# Patient Record
Sex: Female | Born: 1965
Health system: Southern US, Academic
[De-identification: ages and names within clinical notes are randomized; demographics above are authoritative.]

## PROBLEM LIST (undated history)

## (undated) ENCOUNTER — Encounter: Attending: Pharmacist | Primary: Pharmacist

## (undated) ENCOUNTER — Other Ambulatory Visit

## (undated) ENCOUNTER — Ambulatory Visit: Payer: MEDICARE

## (undated) ENCOUNTER — Encounter

## (undated) ENCOUNTER — Telehealth: Attending: Ambulatory Care | Primary: Ambulatory Care

## (undated) ENCOUNTER — Encounter: Attending: Family | Primary: Family

## (undated) ENCOUNTER — Ambulatory Visit

## (undated) ENCOUNTER — Encounter: Attending: Rheumatology | Primary: Rheumatology

## (undated) ENCOUNTER — Telehealth: Attending: Family | Primary: Family

## (undated) ENCOUNTER — Ambulatory Visit: Attending: Pharmacist | Primary: Pharmacist

## (undated) ENCOUNTER — Telehealth

## (undated) ENCOUNTER — Ambulatory Visit: Payer: MEDICARE | Attending: Family | Primary: Family

## (undated) ENCOUNTER — Encounter: Attending: Diagnostic Radiology | Primary: Diagnostic Radiology

## (undated) ENCOUNTER — Encounter: Attending: Ambulatory Care | Primary: Ambulatory Care

## (undated) ENCOUNTER — Encounter: Attending: Internal Medicine | Primary: Internal Medicine

## (undated) ENCOUNTER — Ambulatory Visit: Payer: MEDICARE | Attending: Medical | Primary: Medical

## (undated) ENCOUNTER — Ambulatory Visit
Payer: MEDICARE | Attending: Rehabilitative and Restorative Service Providers" | Primary: Rehabilitative and Restorative Service Providers"

## (undated) ENCOUNTER — Ambulatory Visit: Attending: Ambulatory Care | Primary: Ambulatory Care

## (undated) ENCOUNTER — Encounter: Attending: Nurse Practitioner | Primary: Nurse Practitioner

## (undated) ENCOUNTER — Ambulatory Visit: Payer: MEDICARE | Attending: Dermatology | Primary: Dermatology

---
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
## 1998-09-25 ENCOUNTER — Inpatient Hospital Stay (HOSPITAL_COMMUNITY): Admission: AD | Admit: 1998-09-25 | Discharge: 1998-09-25 | Payer: Self-pay | Admitting: Obstetrics and Gynecology

## 1998-10-01 ENCOUNTER — Inpatient Hospital Stay (HOSPITAL_COMMUNITY): Admission: AD | Admit: 1998-10-01 | Discharge: 1998-10-01 | Payer: Self-pay | Admitting: *Deleted

## 1998-10-02 ENCOUNTER — Inpatient Hospital Stay (HOSPITAL_COMMUNITY): Admission: AD | Admit: 1998-10-02 | Discharge: 1998-10-04 | Payer: Self-pay | Admitting: Obstetrics and Gynecology

## 1998-11-27 ENCOUNTER — Inpatient Hospital Stay (HOSPITAL_COMMUNITY): Admission: AD | Admit: 1998-11-27 | Discharge: 1998-11-27 | Payer: Self-pay | Admitting: Obstetrics and Gynecology

## 1999-01-09 ENCOUNTER — Inpatient Hospital Stay (HOSPITAL_COMMUNITY): Admission: AD | Admit: 1999-01-09 | Discharge: 1999-01-09 | Payer: Self-pay | Admitting: *Deleted

## 1999-01-10 ENCOUNTER — Inpatient Hospital Stay (HOSPITAL_COMMUNITY): Admission: AD | Admit: 1999-01-10 | Discharge: 1999-01-14 | Payer: Self-pay | Admitting: *Deleted

## 1999-02-23 ENCOUNTER — Other Ambulatory Visit: Admission: RE | Admit: 1999-02-23 | Discharge: 1999-02-23 | Payer: Self-pay | Admitting: *Deleted

## 2000-10-07 ENCOUNTER — Other Ambulatory Visit: Admission: RE | Admit: 2000-10-07 | Discharge: 2000-10-07 | Payer: Self-pay | Admitting: *Deleted

## 2000-11-10 ENCOUNTER — Inpatient Hospital Stay (HOSPITAL_COMMUNITY): Admission: AD | Admit: 2000-11-10 | Discharge: 2000-11-13 | Payer: Self-pay | Admitting: *Deleted

## 2001-02-06 ENCOUNTER — Encounter: Payer: Self-pay | Admitting: Emergency Medicine

## 2001-02-06 ENCOUNTER — Emergency Department (HOSPITAL_COMMUNITY): Admission: EM | Admit: 2001-02-06 | Discharge: 2001-02-06 | Payer: Self-pay | Admitting: Emergency Medicine

## 2001-02-28 ENCOUNTER — Ambulatory Visit (HOSPITAL_COMMUNITY): Admission: RE | Admit: 2001-02-28 | Discharge: 2001-02-28 | Payer: Self-pay | Admitting: Family Medicine

## 2001-02-28 ENCOUNTER — Encounter: Payer: Self-pay | Admitting: Family Medicine

## 2003-05-17 ENCOUNTER — Encounter: Payer: Self-pay | Admitting: Family Medicine

## 2003-05-17 ENCOUNTER — Ambulatory Visit (HOSPITAL_COMMUNITY): Admission: RE | Admit: 2003-05-17 | Discharge: 2003-05-17 | Payer: Self-pay | Admitting: Family Medicine

## 2005-08-13 ENCOUNTER — Ambulatory Visit: Payer: Self-pay | Admitting: Orthopedic Surgery

## 2005-08-25 ENCOUNTER — Ambulatory Visit: Payer: Self-pay | Admitting: Orthopedic Surgery

## 2006-06-23 ENCOUNTER — Emergency Department: Payer: Self-pay | Admitting: Emergency Medicine

## 2006-06-28 ENCOUNTER — Ambulatory Visit: Payer: Self-pay | Admitting: Family Medicine

## 2007-05-24 IMAGING — US US PELV - US TRANSVAGINAL
1 series · 17 of 25 positions shown · non-contrast
Comparison: none

REASON FOR EXAM: pelvic pain  endovaginal  rm 4
COMMENTS:

[Series 1: us pelv - us transvaginal · 17 of 29 slices shown]
[im 1/29]
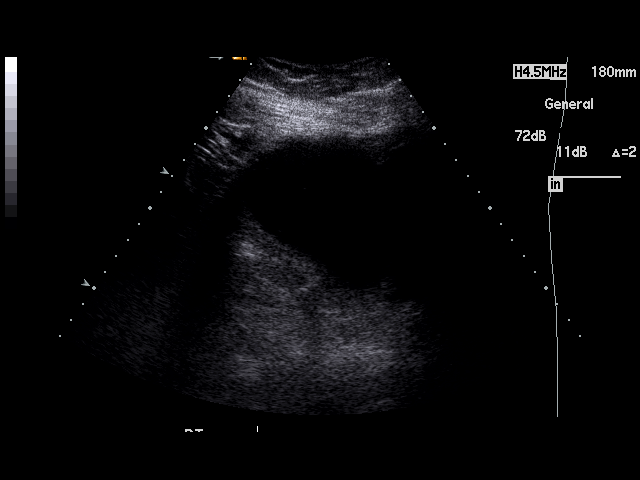
[im 3/29]
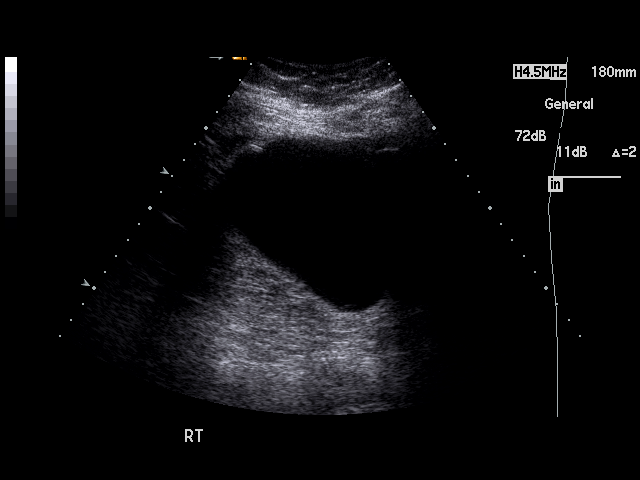
[im 4/29]
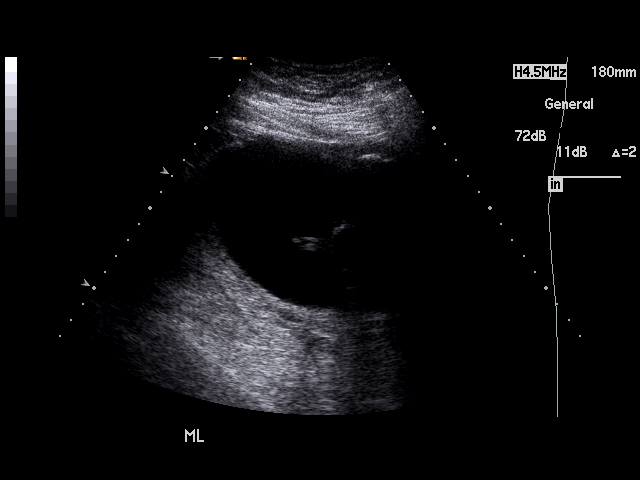
[im 6/29]
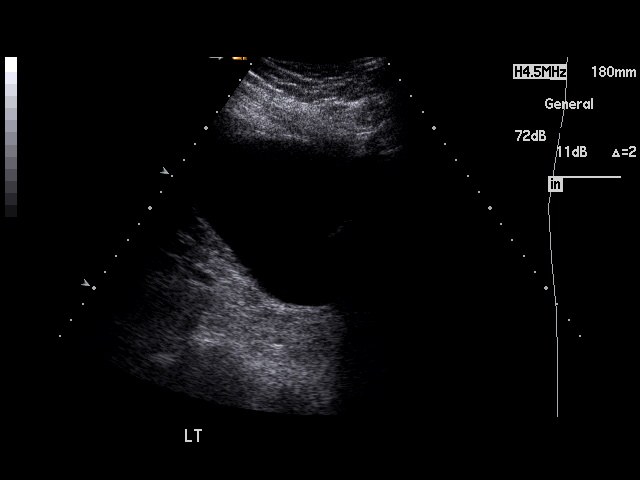
[im 8/29]
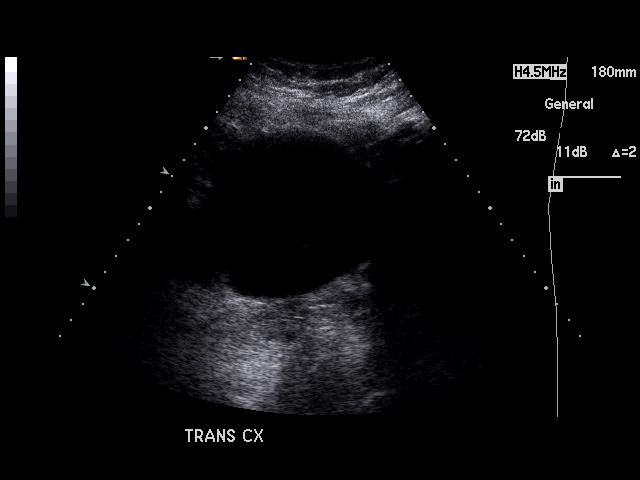
[im 10/29]
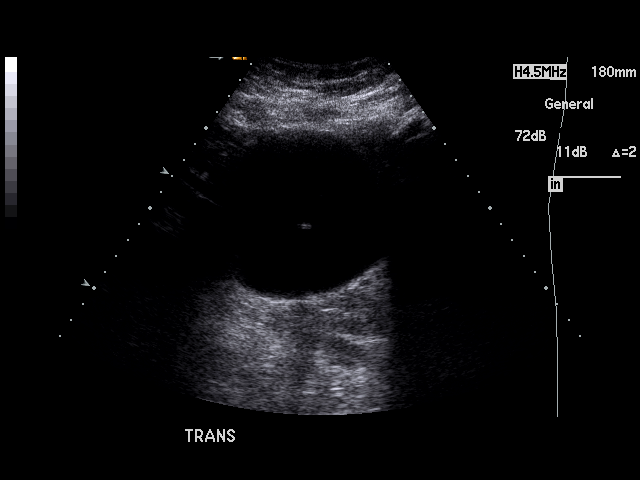
[im 11/29]
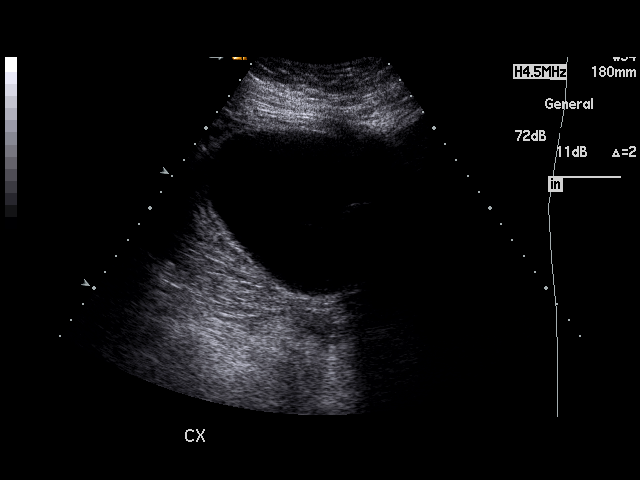
[im 13/29]
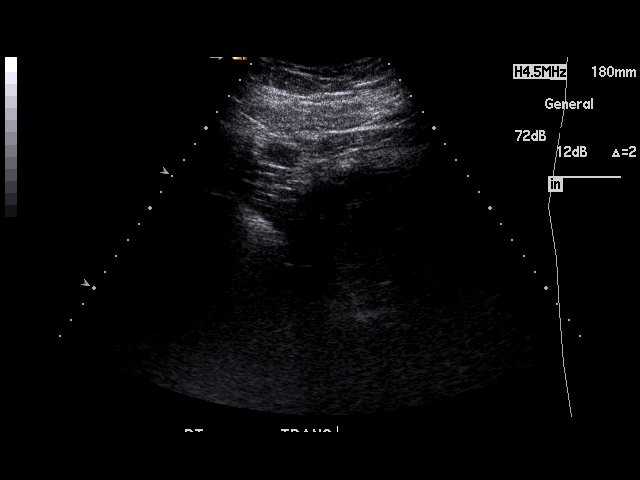
[im 15/29]
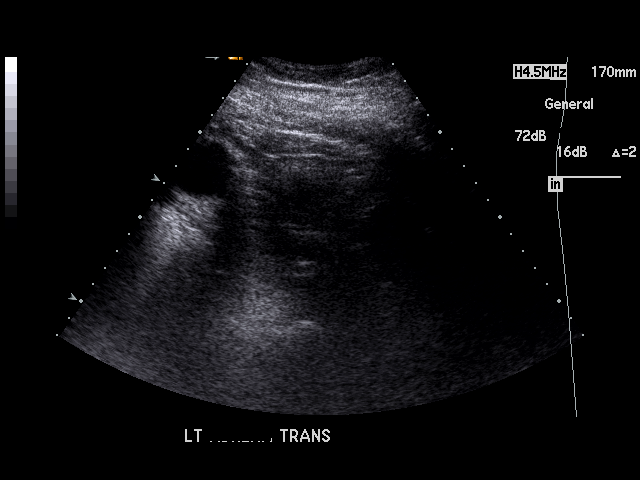
[im 16/29]
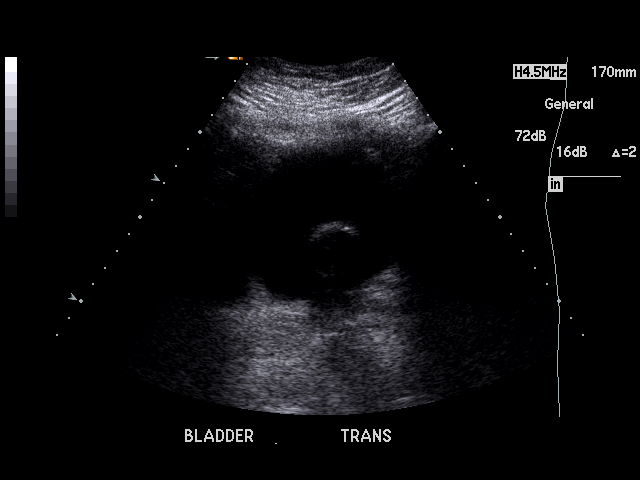
[im 18/29]
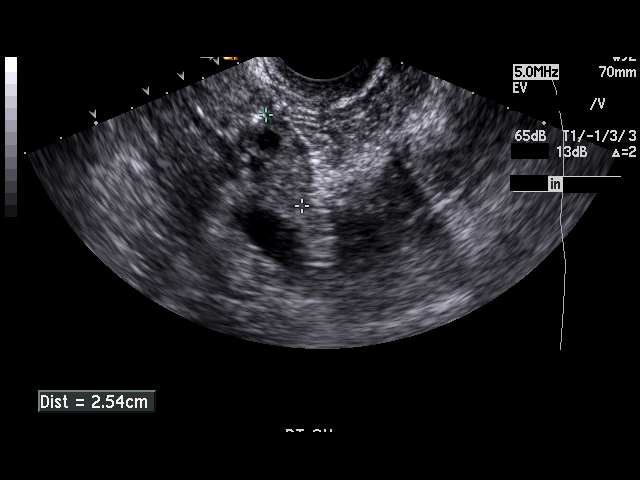
[im 19/29]
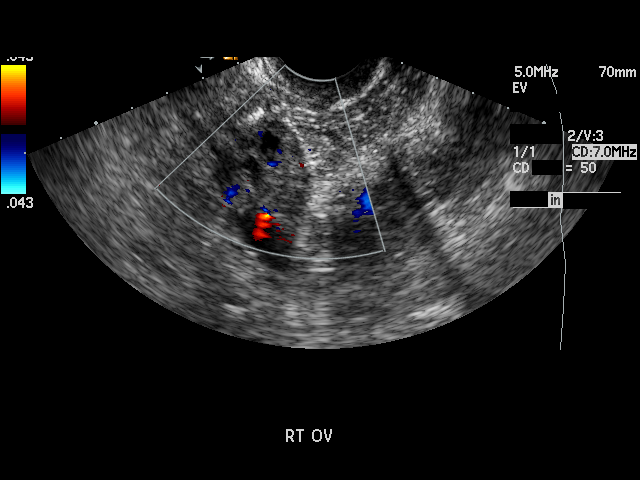
[im 22/29]
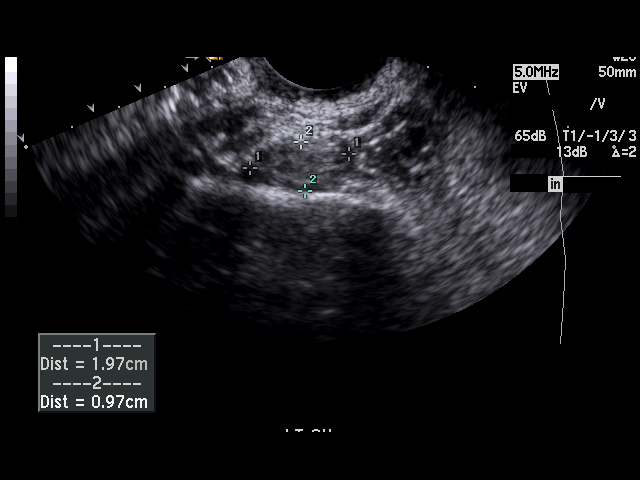
[im 23/29]
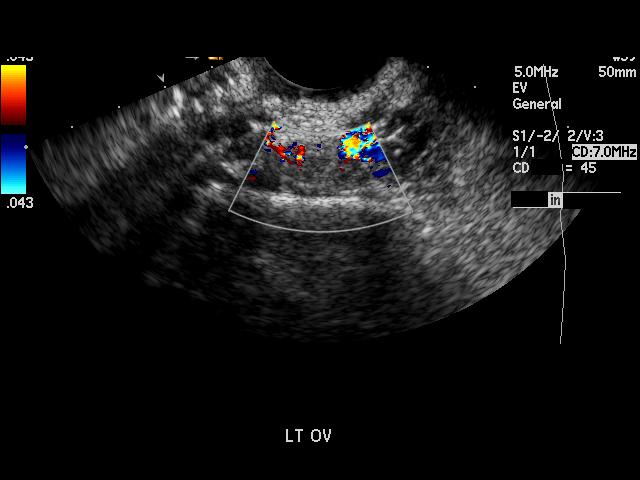
[im 25/29]
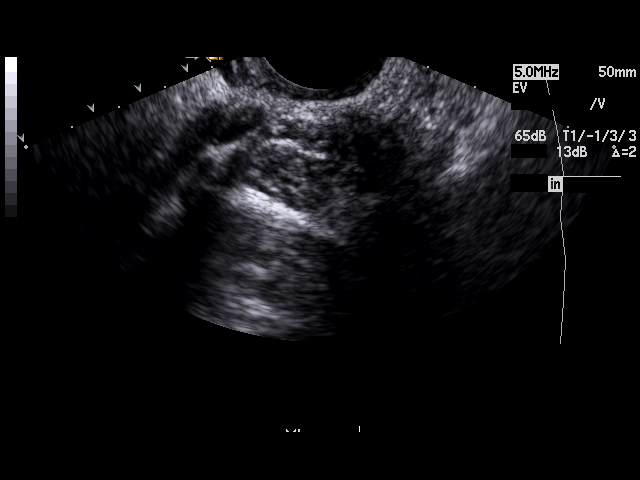
[im 26/29]
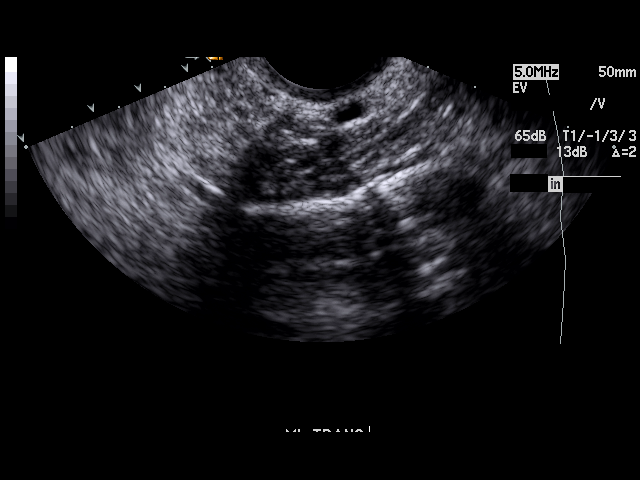
[im 29/29]
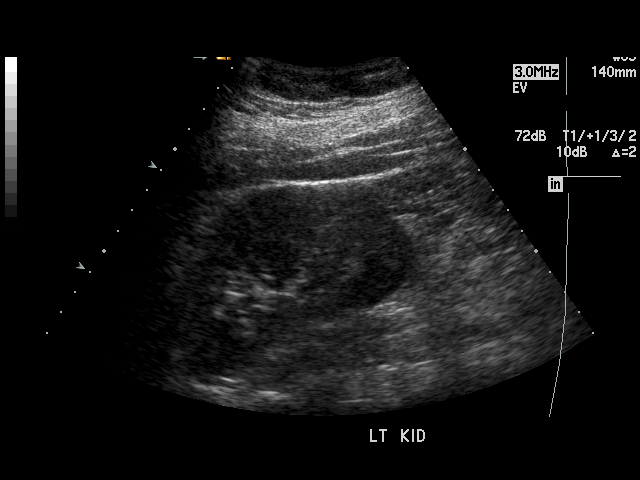

[17 of 25 positions shown; findings below may reference images not displayed]

PROCEDURE:     US  - US PELVIS MASS EXAM  - [DATE]  [DATE] [DATE]  [DATE]

RESULT:     The patient is status post hysterectomy. The ovaries are
visualized bilaterally. The RIGHT ovary measures 2.54 cm at maximum diameter
and the LEFT ovary measures 1.97 cm at maximum diameter.  A few follicular
cysts are seen in the RIGHT ovary.  No abnormal adnexal masses are noted.  A
Foley catheter balloon is present in the urinary bladder. The visualized
portion of the urinary bladder is normal in appearance. The kidneys show no
hydronephrosis. No free fluid is seen in the pelvis.
IMPRESSION: 1)No acute changes are identified.

2)The patient is status post hysterectomy.

3)No abnormal adnexal masses are identified.

## 2008-06-11 ENCOUNTER — Ambulatory Visit: Payer: Self-pay | Admitting: Cardiology

## 2008-06-11 ENCOUNTER — Emergency Department: Payer: Self-pay | Admitting: Emergency Medicine

## 2008-06-11 ENCOUNTER — Other Ambulatory Visit: Payer: Self-pay

## 2014-10-10 ENCOUNTER — Ambulatory Visit: Payer: Self-pay | Admitting: Emergency Medicine

## 2016-08-09 ENCOUNTER — Ambulatory Visit (INDEPENDENT_AMBULATORY_CARE_PROVIDER_SITE_OTHER): Payer: Medicaid Other | Admitting: Podiatry

## 2016-08-09 ENCOUNTER — Encounter: Payer: Self-pay | Admitting: Podiatry

## 2016-08-09 ENCOUNTER — Ambulatory Visit (INDEPENDENT_AMBULATORY_CARE_PROVIDER_SITE_OTHER): Payer: Medicaid Other

## 2016-08-09 VITALS — BP 116/78 | HR 78 | Resp 12

## 2016-08-09 DIAGNOSIS — M7662 Achilles tendinitis, left leg: Secondary | ICD-10-CM

## 2016-08-09 DIAGNOSIS — M797 Fibromyalgia: Secondary | ICD-10-CM | POA: Diagnosis not present

## 2016-08-09 DIAGNOSIS — M722 Plantar fascial fibromatosis: Secondary | ICD-10-CM

## 2016-08-09 MED ORDER — GABAPENTIN 100 MG PO CAPS: 100.0000 mg | ORAL_CAPSULE | Freq: Two times a day (BID) | ORAL | 3 refills | Status: AC

## 2016-08-09 NOTE — Progress Notes (Signed)
   Subjective:    Patient ID: Amy Hebert, female    DOB: [birth year], 50 y.o.   MRN: [id number]  HPI: She presents today with a 2 year duration of pain to her right heel plantarly and posterior heel times past 6 months. She has a history of fibromyalgia as well as psoriatic arthritis with enthesopathies. She is currently taking Enbrel and methotrexate as well as Cymbalta to help with these symptoms. She states that today she feels great but tomorrow she may be painful. She states that her rheumatologist recommended that we treat the pain in her feet including the fibromyalgia in her feet and legs.    Review of Systems  Musculoskeletal: Positive for gait problem.       Objective:   Physical Exam: Vital signs are stable she is alert and oriented 3 pulses are palpable. Neurologic sensorium is intact. Deep tendon reflexes are intact muscle strength is normal bilateral and equal symmetrically. Orthopedic evaluation demonstrates some tenderness on palpation of the medial calcaneal tubercle and central block R calcaneal tubercle of the right heel. The posterior aspect of the left. Radiographs taken today demonstrate plantar and posterior calcaneal heel spurs bilaterally. No fractures identified. Cutaneous evaluation demonstrates supple well-hydrated cutis. Reactive nail growth hallux right probably a nail dystrophy. The remainder of the nail plates. Normal.        Assessment & Plan:  Assessment: Fibromyalgia with plantar fasciitis right tendo Achilles tendinitis left.  Plan: Offered her injections today which she declined offered her physical therapy which her insurance will not cover. I also recommended starting her on gabapentin which we did today 100 mg twice daily and I will follow-up with her in 1 month.

## 2016-09-08 ENCOUNTER — Encounter: Payer: Self-pay | Admitting: Podiatry

## 2016-09-08 ENCOUNTER — Ambulatory Visit (INDEPENDENT_AMBULATORY_CARE_PROVIDER_SITE_OTHER): Payer: Medicaid Other | Admitting: Podiatry

## 2016-09-08 DIAGNOSIS — M722 Plantar fascial fibromatosis: Secondary | ICD-10-CM

## 2016-09-08 DIAGNOSIS — M7662 Achilles tendinitis, left leg: Secondary | ICD-10-CM

## 2016-09-08 NOTE — Progress Notes (Signed)
She presents today for follow-up of plantar fasciitis and Achilles tendinitis in her left heel. She states that she is doing much better and that her orthopedist thinks that she may have a neck issue that is complicating her leg pain and foot pain. She states that the gabapentin that we placed her on seems to be helping considerably.  Objective: Vital signs are stable alert and oriented 3. Pulses are palpable. Neurologic sensorium is intact. She has no reproducible pain on palpation either the left heel plantarly or posteriorly.  Assessment: Resolving Achilles tendinitis and plantar fasciitis.  Plan: I will follow-up with her on an as-needed basis.

## 2016-09-27 ENCOUNTER — Ambulatory Visit: Payer: Medicaid Other | Attending: Orthopedic Surgery | Admitting: Physical Therapy

## 2016-09-27 ENCOUNTER — Encounter: Payer: Self-pay | Admitting: Physical Therapy

## 2016-09-27 DIAGNOSIS — M542 Cervicalgia: Secondary | ICD-10-CM | POA: Diagnosis present

## 2016-09-27 DIAGNOSIS — M6281 Muscle weakness (generalized): Secondary | ICD-10-CM | POA: Diagnosis present

## 2016-09-27 DIAGNOSIS — M25511 Pain in right shoulder: Secondary | ICD-10-CM | POA: Insufficient documentation

## 2016-09-27 DIAGNOSIS — G8929 Other chronic pain: Secondary | ICD-10-CM

## 2016-09-27 NOTE — Therapy (Signed)
[facility] [address] [place], [place], [address] Phone: [phone]   Fax:  [phone]  Physical Therapy Evaluation  Patient Details  Name: Amy Hebert MRN: [id number] Date of Birth: [birth year] Referring Provider: Dante GangJonathan T Mundy PA  Encounter Date: 09/27/2016      PT End of Session - 09/27/16 1530    Visit Number 1   Number of Visits 1   PT Start Time 1118   PT Stop Time 1209   PT Time Calculation (min) 51 min   Activity Tolerance Patient tolerated treatment well;Patient limited by pain   Behavior During Therapy [facility] for tasks assessed/performed      History reviewed. No pertinent past medical history.  History reviewed. No pertinent surgical history.  There were no vitals filed for this visit.       Subjective Assessment - 09/27/16 1519    Subjective Pt has a history of chronic shoulder and neck pain. States that her L shoulder/arm feels weak but she experiences most of the pain in her neck and R shoulder. She was told she had a herniated disc in her cervical spine and is concerned about moving her head from side to side for fear that she will injury herself. She has been diagnosed with psoriatic arthritis and fibromyalgia and is limited in the amount of exercise that her body will tolerate. Pt is not working at this time due to poor health and is struggling to continue to perform ADLs due to pain. Pt states that she experiences jolts of pain up her arm and neck but the pain is usually short lived, "there and then gone" but she does note a more constant pain evident in her upper back and shoulders. States that if she does not move for an extended period of time she will be pain free but once she starts moving, pain is aggravated.   Pertinent History Chronic hx of neck and shoulder pain. Co-morbidites include fibromyalgia and psoriatic arthritis    Limitations Lifting;House hold activities   Diagnostic tests Xrays  of the cervical spine were ordered and interpreted 09/07/2016, with 2 views using AP and lateral views.  Xrays revealed multilevel degenerative disc changes with the C6-C7 level showing more predominant narrowing with disc space collapse. There is no subluxation or abnormal curvature.   Patient Stated Goals pt would like to be more active/pain free. states she would like to be able to play basketball as a hobby   Currently in Pain? Yes   Pain Score 4    Pain Location Neck   Pain Orientation Lower;Mid;Right;Left   Pain Descriptors / Indicators Constant   Pain Type Chronic pain   Pain Onset More than a month ago   Pain Frequency Constant            [facility] PT Assessment - 09/27/16 0001      Assessment   Medical Diagnosis cervical radiculopathy   Referring Provider Dante GangJonathan T Mundy PA   Onset Date/Surgical Date 12/29/15   Hand Dominance Right      Objective:  Therapeutic Exercise: See pt instructions for HEP.  Pt response for medical necessity: Pt experiences soreness in neck and shoulders following period of extended sitting upright. She expresses understanding of HEP program but is unable to perform due to moderate pain. Pt will follow up with PT with any questions/progression.        PT Education - 09/27/16 1530    Education provided Yes   Education  Details see pt instructions for HEP. Instruction on sleeping posture/pillow use.   Person(s) Educated Patient   Methods Explanation;Demonstration   Comprehension Verbalized understanding;Returned demonstration           Plan - 09/27/16 1531    Clinical Impression Statement Pt is a pleasant 50 year old female with chronic history of shoulder and neck pain. She experiences brief exacerbations of 10/10 pain associated with inconsistent movement patterns but states that are short lived. Pt states that she is pain free prior to movement and then notes aggravation of sx with continued activity. R shoulder pain is located at R  shoulder jt line/ac jt; overlap of neck pain through lower cervical spine and R upper trapezius. Palpation: pt with moderate-severe TTP of R upper trapezius near clavicular insertion. Posture: pt with tendency for mild forward head and thoracic kyphosis with seated posture; long hx of computer based employment. Cervical AROM: pt moderately guarded with cervical mobility in all planes secondary to pain during movement and at end range. Pt with no change in sx with repeated cervical retraction but no reports of pain. R/L Shoulder AROM: pt with full AROM in bilat shoulders but pain limited during exam. Strength: MMT R/L shoulder flexion 4-/4-, shoulder abduction 4-/4, biceps 4-/4, triceps 4-/4. Sensation: pt reports numbness through bilateral forearms and dorsal/volar surface of bilat hands with no consistent pattern of aggravation/sx resolution. Outcome Measures: NDI 46%.    Rehab Potential Fair   Clinical Impairments Affecting Rehab Potential Co morbidities. Chronic nature of pain.    PT Treatment/Interventions ADLs/Self Care Home Management;Aquatic Therapy;Cryotherapy;Electrical Stimulation;Functional mobility training;Therapeutic activities;Therapeutic exercise;Patient/family education;Manual techniques;Passive range of motion;Dry needling   Consulted and Agree with Plan of Care Patient      Patient will benefit from skilled therapeutic intervention in order to improve the following deficits and impairments:  Decreased activity tolerance, Decreased range of motion, Decreased strength, Hypomobility, Increased muscle spasms, Impaired flexibility, Impaired sensation, Impaired UE functional use, Improper body mechanics, Postural dysfunction, Pain  Visit Diagnosis: Chronic right shoulder pain  Cervicalgia  Muscle weakness (generalized)     Problem List There are no active problems to display for this patient.  Cammie Mcgee, PT, DPT # [id number] [name] SPT 09/27/2016, 3:50 PM  [facility] [address] [place], [place], [address] Phone: [phone]   Fax:  [phone]  Name: Amy Hebert MRN: [id number] Date of Birth: 26-Aug-1966

## 2016-09-30 ENCOUNTER — Other Ambulatory Visit: Payer: Self-pay | Admitting: Orthopedic Surgery

## 2016-09-30 DIAGNOSIS — M5030 Other cervical disc degeneration, unspecified cervical region: Secondary | ICD-10-CM

## 2016-09-30 DIAGNOSIS — M5412 Radiculopathy, cervical region: Secondary | ICD-10-CM

## 2016-10-13 ENCOUNTER — Encounter: Payer: Self-pay | Admitting: Radiology

## 2016-10-13 ENCOUNTER — Ambulatory Visit
Admission: RE | Admit: 2016-10-13 | Discharge: 2016-10-13 | Disposition: A | Discharge status: Home / Self Care | Payer: Medicaid Other | Source: Ambulatory Visit | Attending: Orthopedic Surgery | Admitting: Orthopedic Surgery

## 2016-10-13 DIAGNOSIS — M5030 Other cervical disc degeneration, unspecified cervical region: Secondary | ICD-10-CM | POA: Diagnosis not present

## 2016-10-13 DIAGNOSIS — M5412 Radiculopathy, cervical region: Secondary | ICD-10-CM

## 2016-10-13 DIAGNOSIS — M4802 Spinal stenosis, cervical region: Secondary | ICD-10-CM | POA: Diagnosis not present

## 2017-07-21 ENCOUNTER — Ambulatory Visit
Admission: RE | Admit: 2017-07-21 | Discharge: 1898-12-20 | Payer: MEDICAID | Attending: Neurological Surgery | Admitting: Neurological Surgery

## 2017-07-28 ENCOUNTER — Emergency Department
Admission: EM | Admit: 2017-07-28 | Discharge: 2017-07-28 | Disposition: A | Discharge status: Home / Self Care | Payer: MEDICAID | Source: Intra-hospital

## 2017-07-28 MED ORDER — DIAZEPAM 5 MG TABLET
ORAL_TABLET | Freq: Two times a day (BID) | ORAL | 0 refills | 0.00000 days | Status: CP
Start: 2017-07-28 — End: 2017-07-31

## 2017-09-13 IMAGING — MR MR CERVICAL SPINE W/O CM
5 series · 33 of 48 positions shown · non-contrast
Comparison: None.

CLINICAL DATA: Cervical radiculopathy. Degenerative disc disease.
Decrease in hand grip.

EXAM:
MRI CERVICAL SPINE WITHOUT CONTRAST
TECHNIQUE: Multiplanar, multisequence MR imaging of the cervical spine was
performed. No intravenous contrast was administered.

[Series 3: T2 · sagittal · 3.0mm · 0.70mm/px · 6 of 15 slices shown (1 of 2)]
[im 1/15]
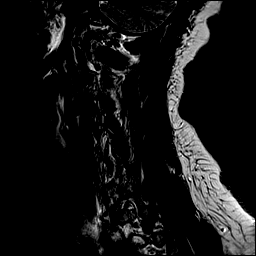
[im 3/15]
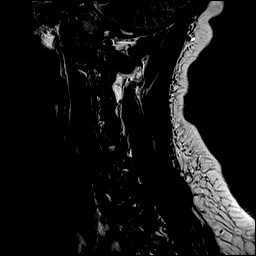
[im 6/15]
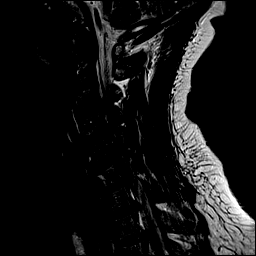
[im 9/15]
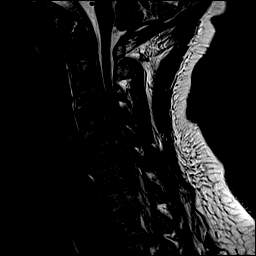
[im 12/15]
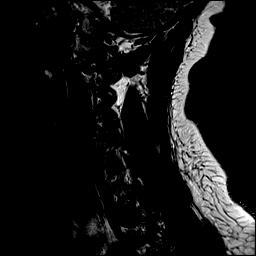
[im 15/15]
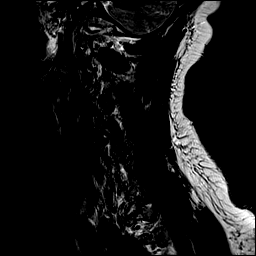

[Series 4: T1 · sagittal · 3.0mm · 0.70mm/px · 7 of 15 slices shown]
[im 1/15]
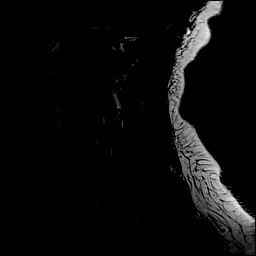
[im 3/15]
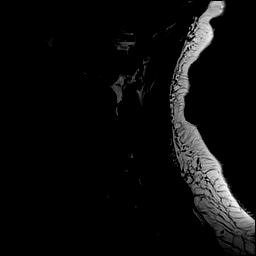
[im 5/15]
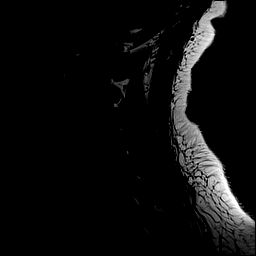
[im 8/15]
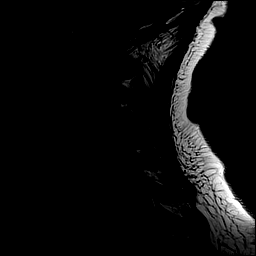
[im 10/15]
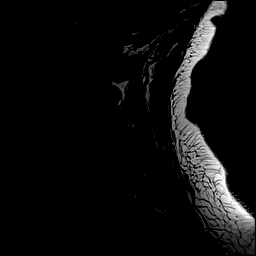
[im 12/15]
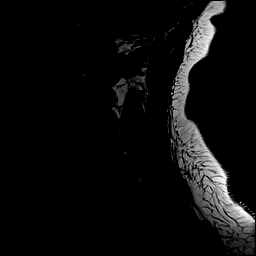
[im 15/15]
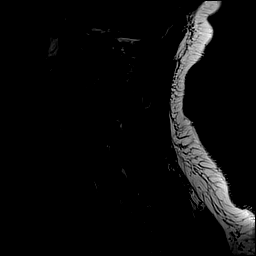

[Series 5: STIR · sagittal · 3.0mm · 0.35mm/px · 7 of 15 slices shown]
[im 1/15]
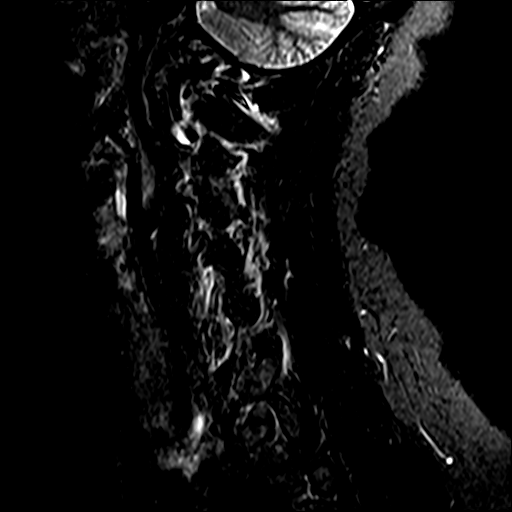
[im 3/15]
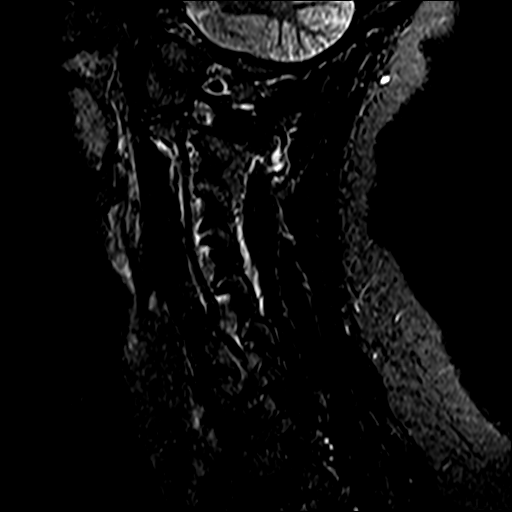
[im 5/15]
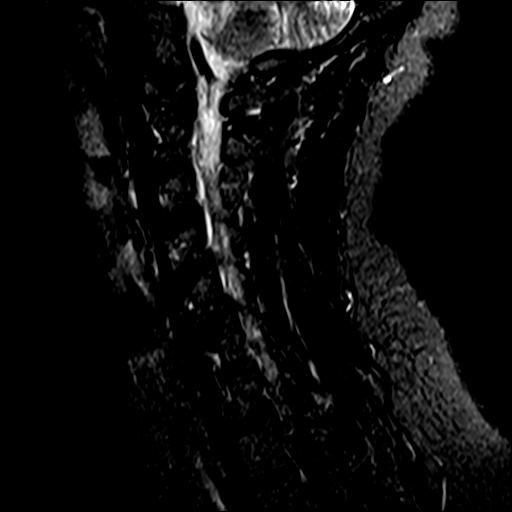
[im 8/15]
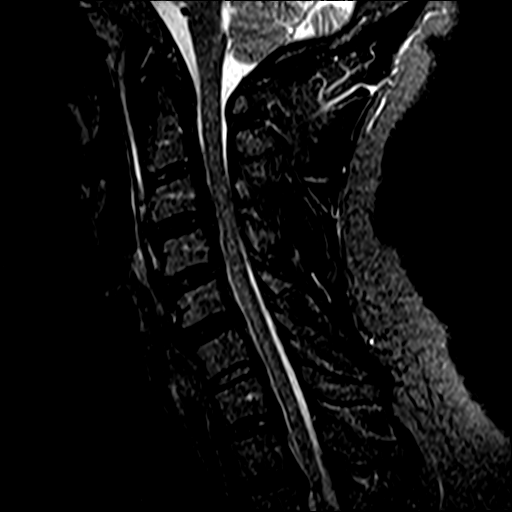
[im 10/15]
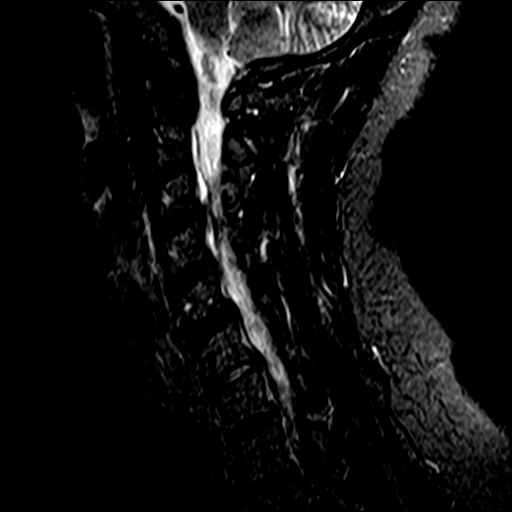
[im 12/15]
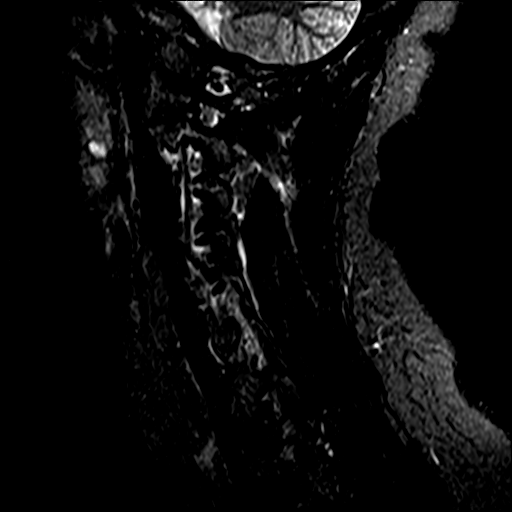
[im 15/15]
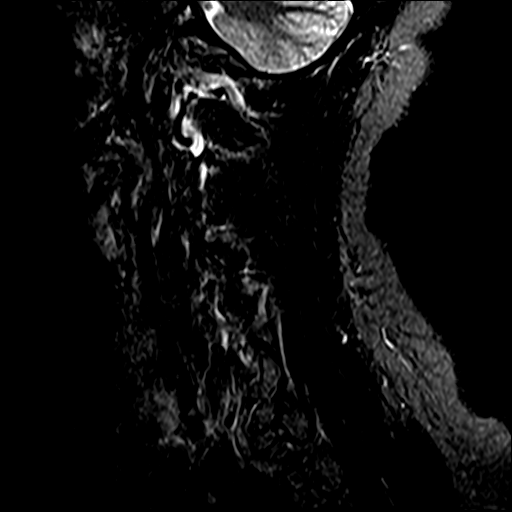

[Series 6: T2 · axial · 3.0mm · 0.70mm/px · z∈[-99,+7]mm · 8 of 29 slices shown (2 of 2)]
[im 1/29]
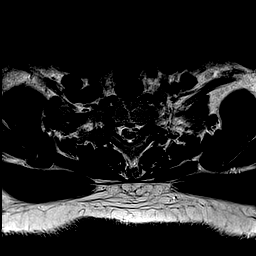
[im 5/29]
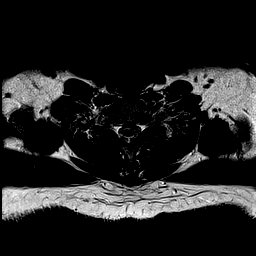
[im 9/29]
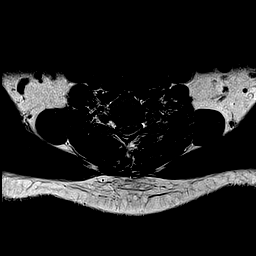
[im 13/29]
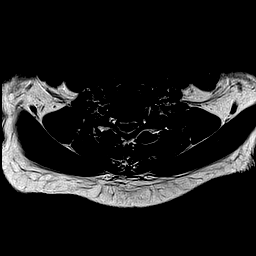
[im 16/29]
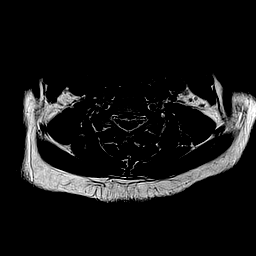
[im 20/29]
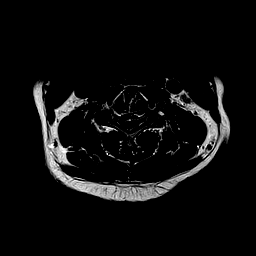
[im 24/29]
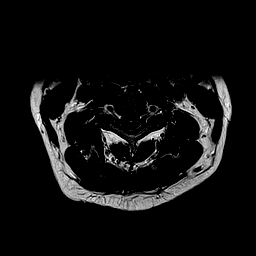
[im 29/29]
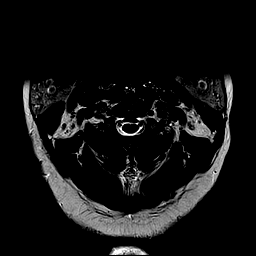

[Series 7: mpgr ax · axial · 3.0mm · 0.35mm/px · z∈[-99,-43]mm · 5 of 29 slices shown]
[im 1/29]
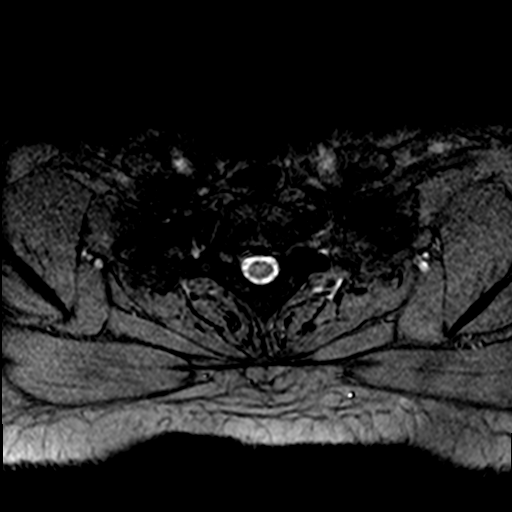
[im 5/29]
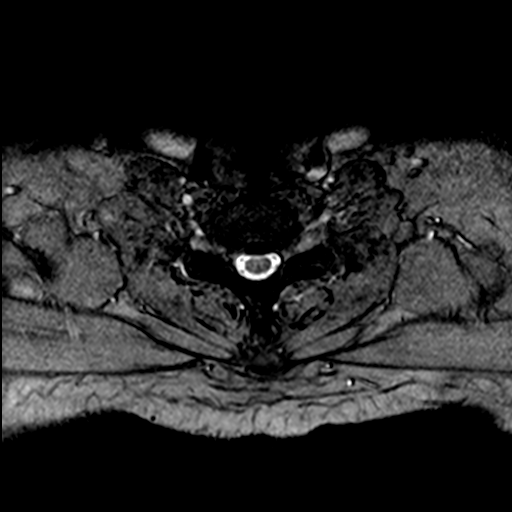
[im 9/29]
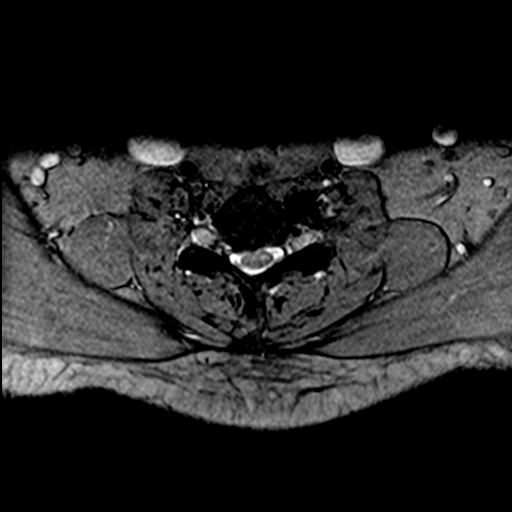
[im 13/29]
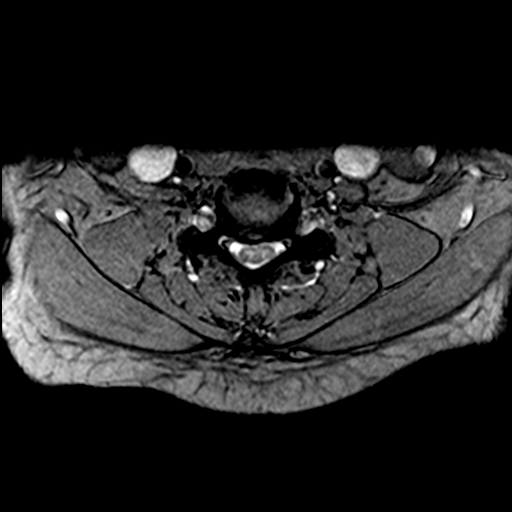
[im 16/29]
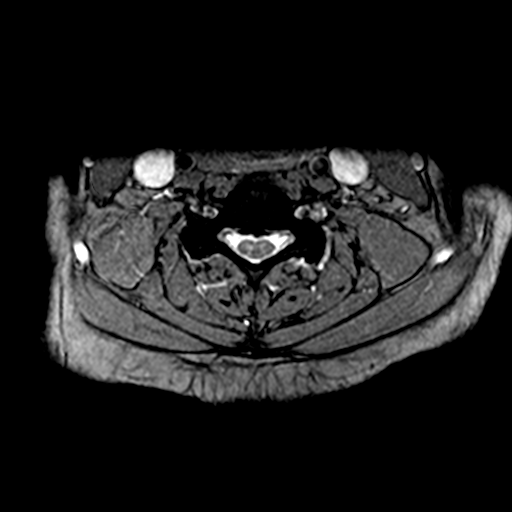

[33 of 48 positions shown; findings below may reference images not displayed]

FINDINGS: Alignment: Physiologic.

Vertebrae: No fracture, evidence of discitis, or bone lesion.

Cord: No signal abnormality.

Posterior Fossa, vertebral arteries, paraspinal tissues: Negative.

Disc levels:

C2-3: Hypo intense ligamentous thickening or ossification posterior
to the dens. Subligamentous disc bulging. Negative facets. No
impingement

C3-4: Bulky hypointense material greatest at the disc level,
combination of subligamentous disc and probable posterior
longitudinal ligament ossification. There is canal stenosis with
moderate cord flattening. Patent foramina

C4-5: Ventral hypointense material that is likely combination of
subligamentous disc and posterior longitudinal ligament
ossification. Facet arthropathy on the right with mild spurring.
Canal stenosis with cord flattening greater on the right. Patent
foramina

C5-6: Disc osteophyte complex and left uncovertebral ridging.
Ventral subarachnoid space effacement without cord compression. Mild
left foraminal narrowing, with the upper foramen patent on gradient
axials.

C6-7: Central disc protrusion.  Negative facets.  No impingement

C7-T1:Disc bulging versus a broad central protrusion. Negative
facets. No impingement

T1-2: Central disc protrusion that is down turning. No cord
compression.
IMPRESSION: 1. Diffuse discogenic canal stenosis, greatest at C3-4 and C4-5
where there is moderate cord flattening. Probable superimposed
ossification of the posterior longitudinal ligament, which would be
better characterized by cervical spine CT.
2. No cord signal abnormality.
3. Patent foramina.

## 2017-09-27 MED ORDER — CERTOLIZUMAB PEGOL 400 MG/2 ML (200 MG/ML X2) SUBCUTANEOUS SYRINGE KIT: kit | 0 refills | 0 days

## 2017-09-27 MED ORDER — LEUCOVORIN CALCIUM 5 MG TABLET
ORAL_TABLET | ORAL | 3 refills | 0 days | Status: CP
Start: 2017-09-27 — End: 2017-09-28

## 2017-09-27 MED ORDER — CERTOLIZUMAB PEGOL 400 MG/2 ML (200 MG/ML X2) SUBCUTANEOUS SYRINGE KIT
PACK | SUBCUTANEOUS | 3 refills | 0.00000 days | Status: CP
Start: 2017-09-27 — End: 2018-08-16

## 2017-09-27 MED ORDER — CERTOLIZUMAB PEGOL 400 MG/2 ML (200 MG/ML X2) SUBCUTANEOUS SYRINGE KIT: 200 mg | each | 3 refills | 0 days | Status: AC

## 2017-09-27 MED ORDER — CERTOLIZUMAB PEGOL 400 MG/2 ML (200 MG/ML X2) SUBCUTANEOUS SYRINGE KIT: 400 mg | mL | 0 refills | 0 days | Status: AC

## 2017-09-27 MED ORDER — METHOTREXATE SODIUM 2.5 MG TABLET
ORAL_TABLET | ORAL | 3 refills | 0.00000 days | Status: CP
Start: 2017-09-27 — End: 2017-09-28

## 2017-09-28 MED ORDER — METHOTREXATE SODIUM 2.5 MG TABLET
ORAL_TABLET | ORAL | 3 refills | 0.00000 days | Status: CP
Start: 2017-09-28 — End: 2017-09-28

## 2017-09-28 MED ORDER — LEUCOVORIN CALCIUM 5 MG TABLET
ORAL_TABLET | ORAL | 3 refills | 0.00000 days | Status: CP
Start: 2017-09-28 — End: 2018-07-14

## 2017-09-28 MED ORDER — LEUCOVORIN CALCIUM 5 MG TABLET: tablet | 3 refills | 0 days | Status: AC

## 2017-09-28 MED ORDER — METHOTREXATE SODIUM 2.5 MG TABLET: tablet | 3 refills | 0 days

## 2017-09-28 NOTE — Unmapped (Signed)
Addended by: Geoffry Paradise T on: 09/28/2017 04:05 PM     Modules accepted: Orders

## 2017-09-29 MED FILL — METHOTREXATE/2.5MG/TAB: METHOTREXATE/2.5MG/TAB | 14 days supply | Qty: 16 | Fill #0

## 2017-09-29 MED FILL — LEUCOVORIN/5MG/TAB: LEUCOVORIN/5MG/TAB | 14 days supply | Qty: 4 | Fill #0

## 2017-10-06 ENCOUNTER — Emergency Department
Admission: EM | Admit: 2017-10-06 | Discharge: 2017-10-06 | Disposition: A | Discharge status: Home / Self Care | Payer: MEDICAID | Source: Intra-hospital

## 2017-10-06 DIAGNOSIS — M79671 Pain in right foot: Principal | ICD-10-CM

## 2017-10-11 MED FILL — METHOTREXATE/2.5MG/TAB: METHOTREXATE/2.5MG/TAB | 84 days supply | Qty: 96 | Fill #1

## 2017-10-11 MED FILL — LEUCOVORIN/5MG/TAB: LEUCOVORIN/5MG/TAB | 96 days supply | Qty: 24 | Fill #1

## 2017-10-13 ENCOUNTER — Ambulatory Visit
Admission: RE | Admit: 2017-10-13 | Discharge: 2017-10-13 | Disposition: A | Discharge status: Home / Self Care | Attending: Ambulatory Care | Admitting: Ambulatory Care

## 2017-10-13 DIAGNOSIS — L4050 Arthropathic psoriasis, unspecified: Principal | ICD-10-CM

## 2017-12-02 ENCOUNTER — Ambulatory Visit: Admission: RE | Admit: 2017-12-02 | Discharge: 2017-12-02 | Attending: Family | Admitting: Family

## 2017-12-02 DIAGNOSIS — G959 Disease of spinal cord, unspecified: Secondary | ICD-10-CM

## 2017-12-02 DIAGNOSIS — Z1211 Encounter for screening for malignant neoplasm of colon: Secondary | ICD-10-CM

## 2017-12-02 DIAGNOSIS — M797 Fibromyalgia: Secondary | ICD-10-CM

## 2017-12-02 DIAGNOSIS — Z1239 Encounter for other screening for malignant neoplasm of breast: Secondary | ICD-10-CM

## 2017-12-02 DIAGNOSIS — Z23 Encounter for immunization: Secondary | ICD-10-CM

## 2017-12-02 DIAGNOSIS — F419 Anxiety disorder, unspecified: Principal | ICD-10-CM

## 2017-12-02 MED ORDER — ESCITALOPRAM 10 MG TABLET: 10 mg | tablet | 1 refills | 0 days

## 2017-12-02 MED ORDER — ESCITALOPRAM 10 MG TABLET
ORAL_TABLET | Freq: Every day | ORAL | 1 refills | 0.00000 days | Status: CP
Start: 2017-12-02 — End: 2018-07-14

## 2017-12-08 ENCOUNTER — Ambulatory Visit: Admission: RE | Admit: 2017-12-08 | Discharge: 2017-12-08 | Disposition: A | Discharge status: Home / Self Care

## 2017-12-08 DIAGNOSIS — Z79899 Other long term (current) drug therapy: Secondary | ICD-10-CM

## 2017-12-08 DIAGNOSIS — L4050 Arthropathic psoriasis, unspecified: Principal | ICD-10-CM

## 2017-12-08 DIAGNOSIS — M797 Fibromyalgia: Secondary | ICD-10-CM

## 2017-12-08 MED ORDER — GABAPENTIN 100 MG CAPSULE
ORAL_CAPSULE | Freq: Every evening | ORAL | 3 refills | 0.00000 days | Status: CP
Start: 2017-12-08 — End: 2018-04-11

## 2017-12-08 MED ORDER — GABAPENTIN 100 MG CAPSULE: 200 mg | capsule | 3 refills | 0 days

## 2017-12-08 MED FILL — ESCITALOPRAM OXALATE/10MG/TABS: ESCITALOPRAM OXALATE/10MG/TABS | 90 days supply | Qty: 90 | Fill #0

## 2017-12-16 MED FILL — GABAPENTIN/100MG/CAPS: GABAPENTIN/100MG/CAPS | 90 days supply | Qty: 180 | Fill #0

## 2018-01-04 MED ORDER — BACLOFEN 10 MG TABLET: 10 mg | tablet | 3 refills | 0 days | Status: AC

## 2018-01-04 MED ORDER — BACLOFEN 10 MG TABLET
ORAL_TABLET | Freq: Three times a day (TID) | ORAL | 3 refills | 0.00000 days | Status: CP | PRN
Start: 2018-01-04 — End: 2018-01-04

## 2018-01-05 MED FILL — BACLOFEN/10MG/TABS: BACLOFEN/10MG/TABS | 90 days supply | Qty: 270 | Fill #0

## 2018-01-05 MED FILL — METHOTREXATE/2.5MG/TAB: METHOTREXATE/2.5MG/TAB | 84 days supply | Qty: 96 | Fill #2

## 2018-01-05 MED FILL — LEUCOVORIN/5MG/TAB: LEUCOVORIN/5MG/TAB | 96 days supply | Qty: 24 | Fill #3

## 2018-02-07 ENCOUNTER — Ambulatory Visit: Admit: 2018-02-07 | Discharge: 2018-02-08

## 2018-02-07 DIAGNOSIS — Z1239 Encounter for other screening for malignant neoplasm of breast: Principal | ICD-10-CM

## 2018-03-02 ENCOUNTER — Ambulatory Visit: Admit: 2018-03-02 | Discharge: 2018-03-03 | Attending: Family | Primary: Family

## 2018-03-02 DIAGNOSIS — J301 Allergic rhinitis due to pollen: Secondary | ICD-10-CM

## 2018-03-02 DIAGNOSIS — F419 Anxiety disorder, unspecified: Principal | ICD-10-CM

## 2018-03-02 DIAGNOSIS — Z1211 Encounter for screening for malignant neoplasm of colon: Secondary | ICD-10-CM

## 2018-03-02 DIAGNOSIS — R1030 Lower abdominal pain, unspecified: Secondary | ICD-10-CM

## 2018-03-02 MED ORDER — FLUTICASONE PROPIONATE 50 MCG/ACTUATION NASAL SPRAY,SUSPENSION: 1 | g | 5 refills | 0 days | Status: AC

## 2018-03-02 MED ORDER — FLUTICASONE PROPIONATE 50 MCG/ACTUATION NASAL SPRAY,SUSPENSION
Freq: Two times a day (BID) | NASAL | 5 refills | 0.00000 days | Status: CP
Start: 2018-03-02 — End: 2019-07-03

## 2018-03-02 MED ORDER — FLUTICASONE PROPIONATE 50 MCG/ACTUATION NASAL SPRAY,SUSPENSION: 1 | g | Freq: Two times a day (BID) | 5 refills | 0 days | Status: AC

## 2018-03-03 DIAGNOSIS — Z1211 Encounter for screening for malignant neoplasm of colon: Principal | ICD-10-CM

## 2018-03-04 ENCOUNTER — Ambulatory Visit: Admit: 2018-03-04 | Discharge: 2018-03-04

## 2018-04-04 MED FILL — LEUCOVORIN/5MG/TAB: LEUCOVORIN/5MG/TAB | 96 days supply | Qty: 24 | Fill #4

## 2018-04-04 MED FILL — GABAPENTIN/100MG/CAPS: GABAPENTIN/100MG/CAPS | 90 days supply | Qty: 180 | Fill #1

## 2018-04-04 MED FILL — ESCITALOPRAM OXALATE/10MG/TABS: ESCITALOPRAM OXALATE/10MG/TABS | 90 days supply | Qty: 90 | Fill #1

## 2018-04-04 MED FILL — BACLOFEN/10MG/TABS: BACLOFEN/10MG/TABS | 90 days supply | Qty: 270 | Fill #1

## 2018-04-04 MED FILL — METHOTREXATE/2.5MG/TAB: METHOTREXATE/2.5MG/TAB | 84 days supply | Qty: 96 | Fill #3

## 2018-04-11 ENCOUNTER — Ambulatory Visit: Admit: 2018-04-11 | Discharge: 2018-04-12

## 2018-04-11 DIAGNOSIS — M797 Fibromyalgia: Secondary | ICD-10-CM

## 2018-04-11 DIAGNOSIS — L4050 Arthropathic psoriasis, unspecified: Principal | ICD-10-CM

## 2018-04-11 DIAGNOSIS — Z79899 Other long term (current) drug therapy: Secondary | ICD-10-CM

## 2018-04-11 MED ORDER — GABAPENTIN 300 MG CAPSULE
ORAL_CAPSULE | Freq: Every evening | ORAL | 3 refills | 0 days | Status: CP
Start: 2018-04-11 — End: 2018-04-11

## 2018-04-11 MED ORDER — GABAPENTIN 300 MG CAPSULE: 300 mg | capsule | Freq: Every evening | 3 refills | 0 days | Status: AC

## 2018-04-26 ENCOUNTER — Institutional Professional Consult (permissible substitution): Admit: 2018-04-26 | Discharge: 2018-04-27

## 2018-04-26 DIAGNOSIS — L4050 Arthropathic psoriasis, unspecified: Principal | ICD-10-CM

## 2018-05-13 ENCOUNTER — Ambulatory Visit: Admit: 2018-05-13 | Discharge: 2018-05-13 | Disposition: A | Discharge status: Home / Self Care | Payer: MEDICARE

## 2018-05-13 MED ORDER — ACETAMINOPHEN 300 MG-CODEINE 30 MG TABLET: 1 | tablet | Freq: Three times a day (TID) | 0 refills | 0 days | Status: AC

## 2018-05-13 MED ORDER — CAPSAICIN 0.025 % TOPICAL CREAM: g | 0 refills | 0 days

## 2018-05-13 MED ORDER — CAPSAICIN 0.025 % TOPICAL CREAM: g | Freq: Two times a day (BID) | 0 refills | 0 days | Status: AC

## 2018-05-13 MED ORDER — ACETAMINOPHEN 300 MG-CODEINE 30 MG TABLET
ORAL_TABLET | Freq: Three times a day (TID) | ORAL | 0 refills | 0.00000 days | Status: CP | PRN
Start: 2018-05-13 — End: 2018-05-13

## 2018-05-13 MED ORDER — CAPSAICIN 0.025 % TOPICAL CREAM
Freq: Two times a day (BID) | TOPICAL | 0 refills | 0.00000 days | Status: CP
Start: 2018-05-13 — End: 2019-05-13

## 2018-05-30 ENCOUNTER — Institutional Professional Consult (permissible substitution): Admit: 2018-05-30 | Discharge: 2018-05-31 | Payer: MEDICARE

## 2018-05-30 DIAGNOSIS — L4050 Arthropathic psoriasis, unspecified: Principal | ICD-10-CM

## 2018-07-05 ENCOUNTER — Institutional Professional Consult (permissible substitution): Admit: 2018-07-05 | Discharge: 2018-07-05 | Payer: MEDICARE

## 2018-07-05 DIAGNOSIS — L4050 Arthropathic psoriasis, unspecified: Principal | ICD-10-CM

## 2018-07-14 MED ORDER — METHOTREXATE SODIUM 2.5 MG TABLET: tablet | 3 refills | 0 days | Status: AC

## 2018-07-14 MED ORDER — LEUCOVORIN CALCIUM 5 MG TABLET: tablet | 11 refills | 0 days | Status: AC

## 2018-07-14 MED ORDER — METHOTREXATE SODIUM 2.5 MG TABLET
ORAL_TABLET | 3 refills | 0.00000 days | Status: CP
Start: 2018-07-14 — End: 2019-07-14

## 2018-07-14 MED ORDER — LEUCOVORIN CALCIUM 5 MG TABLET
ORAL_TABLET | ORAL | 11 refills | 0.00000 days | Status: CP
Start: 2018-07-14 — End: 2018-12-19

## 2018-07-16 MED ORDER — ESCITALOPRAM 10 MG TABLET: 10 mg | each | Freq: Every day | 1 refills | 0 days | Status: AC

## 2018-07-16 MED ORDER — ESCITALOPRAM 10 MG TABLET
ORAL_TABLET | Freq: Every day | ORAL | 1 refills | 0.00000 days | Status: CP
Start: 2018-07-16 — End: 2019-04-03

## 2018-07-18 MED FILL — FLUTICASONE/50MCG/ACT/SUSP: FLUTICASONE/50MCG/ACT/SUSP | 30 days supply | Qty: 1 | Fill #0

## 2018-07-18 MED FILL — ESCITALOPRAM OXALATE/10MG/TABS: ESCITALOPRAM OXALATE/10MG/TABS | 90 days supply | Qty: 90 | Fill #0

## 2018-07-18 MED FILL — LEUCOVORIN/5MG/TAB: LEUCOVORIN/5MG/TAB | 28 days supply | Qty: 8 | Fill #0

## 2018-07-18 MED FILL — GABAPENTIN/100MG/CAPS: GABAPENTIN/100MG/CAPS | 90 days supply | Qty: 180 | Fill #2

## 2018-07-18 MED FILL — METHOTREXATE/2.5MG/TAB: METHOTREXATE/2.5MG/TAB | 84 days supply | Qty: 96 | Fill #0

## 2018-08-11 ENCOUNTER — Ambulatory Visit: Admit: 2018-08-11 | Discharge: 2018-08-12 | Attending: Physician Assistant | Primary: Physician Assistant

## 2018-08-11 DIAGNOSIS — J029 Acute pharyngitis, unspecified: Secondary | ICD-10-CM

## 2018-08-11 DIAGNOSIS — R05 Cough: Principal | ICD-10-CM

## 2018-08-14 ENCOUNTER — Ambulatory Visit: Admit: 2018-08-14 | Discharge: 2018-08-15 | Attending: Family | Primary: Family

## 2018-08-14 DIAGNOSIS — J029 Acute pharyngitis, unspecified: Principal | ICD-10-CM

## 2018-08-14 DIAGNOSIS — J988 Other specified respiratory disorders: Secondary | ICD-10-CM

## 2018-08-14 MED ORDER — CEFDINIR 300 MG CAPSULE: 300 mg | capsule | Freq: Two times a day (BID) | 0 refills | 0 days | Status: AC

## 2018-08-14 MED ORDER — CEFDINIR 300 MG CAPSULE
ORAL_CAPSULE | Freq: Two times a day (BID) | ORAL | 0 refills | 0.00000 days | Status: CP
Start: 2018-08-14 — End: 2018-09-01

## 2018-08-16 ENCOUNTER — Ambulatory Visit: Admit: 2018-08-16 | Discharge: 2018-08-17 | Payer: MEDICARE

## 2018-08-16 DIAGNOSIS — M797 Fibromyalgia: Secondary | ICD-10-CM

## 2018-08-16 DIAGNOSIS — Z79899 Other long term (current) drug therapy: Secondary | ICD-10-CM

## 2018-08-16 DIAGNOSIS — L4050 Arthropathic psoriasis, unspecified: Principal | ICD-10-CM

## 2018-08-16 MED ORDER — CERTOLIZUMAB PEGOL 400 MG/2 ML (200 MG/ML X2) SUBCUTANEOUS SYRINGE KIT
SUBCUTANEOUS | 3 refills | 0 days | Status: CP
Start: 2018-08-16 — End: 2019-08-06
  Filled 2018-10-30: qty 3, 84d supply, fill #0

## 2018-08-17 NOTE — Unmapped (Signed)
[facility] Specialty Medication Referral: No PA required    Medication (Brand/Generic): Cimzia    Initial Benefits Investigation Claim completed with resulted information below:  No PA required  Patient ABLE to fill at [facility]  Insurance Company:  [facility]  Anticipated Copay: $3.80    As Co-pay is under $25 defined limit, per policy there will be no further investigation of need for financial assistance at this time unless patient requests. This referral has been communicated to the provider and handed off to the [facility] team for further processing and filling of prescribed medication.   ______________________________________________________________________  Please utilize this referral for viewing purposes as it will serve as the central location for all relevant documentation and updates.

## 2018-08-24 NOTE — Unmapped (Signed)
[facility] - Pharmacist Onboarding Note    Specialty Medication: Mary Waters, DOB: 25-Dec-1965  Above HIPAA information was verified with patient.     Mary Waters is a 52 y.o. female being continued on Cimzia injection for psoriatic arthritis.  Medication list, allergies and comorbidities reviewed:  appropriate to continue therapy.   Mary Waters was receiving Cimzia through the mfg assistance program and med sent to the [facility] for nurses to administer.  She now has insurance coverage and Cimzia will be dispensed at the [facility], courier over to the [facility] for nurses to administer.  Monthly, I will be sending an email to the Pharmacy Billing department after med is administer to ensure charges are correct since patient's med is paid under the prescription benefit and administration fee is under medical benefit.  Previously, the mfg sent patient another dose of Cimzia but she could not make it in at the end of last month for injection due to an ongoing viral infection.  The plan is for patient to feel better then make an appt with clinic to administer Cimzia using the leftover supply from the mfg.  After this next injection, future med will be coming from the [facility].      Regimen & Administration: Cimzia 400 mg subq once monthly.    Administer without regards to meal.  If a dose is missed, administer as soon as it is remembered and restart administration cycle    Storage/Handling/Disposal:  Refridgerated.  Clinic nurses will dispose needles in clinic sharps container.     Drug-Drug & Drug-Food Interactions:  None noted    Side-effects:  Patient has been on Cimzia - declined further counseling   Injection Training: not needed since clinic nurses will be administering Cimzia     Pharmacy Information:    ?? Patient will be receiving medication from the [facility] [phone], option 4).  A representative from the pharmacy will contact the patient to set up deliveries 7-10 days prior to their subsequent needed refill.  The pharmacy must speak to the patient to schedule the refill.  Advised patient to answer phone calls from the pharmacy to prevent delays in therapy.    ?? Patient will receive a medication information handout as well as a welcome packet from the pharmacy.    ?? The pharmacy is open Monday - Friday 8:30am-4:30pm.  A pharmacist is available 24/7 via pager to answer any clinical questions.    ?? Medication will be courier over to the [facility] prior to administration day.  ?? Anticipated co-pay:  $3.80 for a 1 month supply.    ?? Medication assistance provided: n/a     Emphasized the importance of adherence to prescribed regimen, clinic follow-up visits, and laboratory testing.      SHIPPING     ?? No shipment this month.      Patient specific needs were assessed and addressed:  language differences, literacy level, cultural barriers, cognitive and/or physical impairments.      All questions were answered and contact information provided for any future questions/concerns.      Jeneen Montgomery

## 2018-09-01 ENCOUNTER — Ambulatory Visit: Admit: 2018-09-01 | Discharge: 2018-09-02 | Payer: MEDICARE | Attending: Family | Primary: Family

## 2018-09-01 DIAGNOSIS — M797 Fibromyalgia: Principal | ICD-10-CM

## 2018-09-01 DIAGNOSIS — F419 Anxiety disorder, unspecified: Secondary | ICD-10-CM

## 2018-09-01 NOTE — Unmapped (Addendum)
Patient Education      [employer], use as directed on the box.   Will need to keep an eye on liver enzymes.   Learning About Menopause  What is menopause?    For most women, menopause is a natural process of aging. Menstrual periods gradually stop. The ability to become pregnant ends. Some women feel relief that their childbearing years are ending. But other women struggle with the physical and emotional changes that come with menopause.  For most women, menopause happens around age 22. But every woman's body has its own timeline. Some women stop having periods in their mid-40s. Others keep having periods well into their 50s.  And some women go through menopause early because of cancer treatment or surgery to remove the ovaries.  What can you expect with menopause?  ?? It starts with perimenopause. This is the process of change that leads up to menopause. Perimenopause can start as early as your late 30s or as late as your early 36s. How long it lasts varies. But it usually lasts from 2 to 8 years.  ?? During this time, your hormone levels will go up and down unevenly (fluctuate). This causes changes in your periods and other symptoms. In time, estrogen and progesterone levels drop enough that the menstrual cycle stops. Going a full year without having a period is usually considered menopause.  ?? Low estrogen levels after menopause speed bone loss. This increases your risk of osteoporosis. Also, your risk of heart disease increases after menopause.  ?? It's normal to have thinner, dryer, wrinkled skin after menopause. The vaginal lining and the lower urinary tract also thin and weaken. This can make it hard to have sex. It can also increase the risk of vaginal and urinary tract infections.  What are the symptoms?  ?? Lighter or heavier periods. Your menstrual cycle may be longer or shorter. You may skip periods.  ?? Hot flashes. You may have a sudden feeling of intense body heat. You may sweat, and your head, neck, and chest may get red. Along with hot flashes, you may have a heartbeat that's too fast or not regular. You may also feel anxious or grouchy. In rare cases you might feel panic.  ?? Trouble sleeping.  ?? Mood swings or feeling grouchy, depressed, or worried.  ?? Problems with remembering or thinking clearly.  ?? Vaginal dryness.  Some women have only a few mild symptoms. Others have severe symptoms that disrupt their sleep and daily lives. Symptoms tend to last or get worse the first year or more after menopause. Over time, hormones even out at low levels. Many symptoms improve or go away. But some women may have symptoms that don't go away.  How are menopause symptoms treated?  ??If you have mild symptoms, you may get some relief if you eat healthy foods, exercise, and lower your stress. Some women choose to take medicines if they have severe symptoms that aren't helped by making changes to their lifestyle.  ??Lifestyle changes  ?? ?? Choose a heart-healthy diet that is low in saturated fat. It should include plenty of fruits, vegetables, beans, and high-fiber grains and breads. Be sure you get enough calcium and vitamin D to help your bones stay strong. Low-fat or nonfat dairy products are a great source of calcium.   ?? ?? Get regular exercise. Exercise can help you manage your weight, keep your heart and bones strong, and lift your mood.   ?? ?? Limit caffeine, alcohol,  and stress. These things may make symptoms worse. Limiting them may help you sleep better.   ?? ?? If you smoke, stop. Quitting smoking can reduce hot flashes and long-term health risks.   Medicines  ??If your symptoms are severe, talk with your doctor. You may want to try prescription medicines, such as:  ?? ?? Low-dose birth control pills before menopause.   ?? ?? Low-dose hormone therapy (HT) after menopause.   ?? ?? Antidepressants.   ?? ?? A medicine called clonidine (Catapres) that is usually used to treat high blood pressure.   ??All medicines for menopause symptoms have possible risks or side effects. A very small number of women develop serious health problems when taking hormone therapy. Be sure to talk to your doctor about your possible health risks before you start a treatment for menopause symptoms.  ??Other treatments  ??You can try:  ?? ?? Breathing exercises. They may help reduce hot flashes and emotional symptoms.   ?? ?? Soy. Some women feel that eating lots of soy helps even out their menopause symptoms.   ?? ?? Yoga or biofeedback. They may help reduce stress.   Follow-up care is a key part of your treatment and safety. Be sure to make and go to all appointments, and call your doctor if you are having problems. It's also a good idea to know your test results and keep a list of the medicines you take.  Where can you learn more?  Go to [facility] at https://carlson-fletcher.info/.  [facility] under the Resources menu. Enter H199 in the search box to learn more about Learning About Menopause.  Current as of: February 07, 2018  Content Version: 12.2  ?? 2006-2019 [employer]. Care instructions adapted under license by [facility]. If you have questions about a medical condition or this instruction, always ask your healthcare professional. [employer] disclaims any warranty or liability for your use of this information.
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
## 2018-10-02 NOTE — Unmapped (Signed)
[facility] RHEUMATOLOGY CLINIC - PHARMACIST NOTES    Patient did not come to clinic for Cimzia injection in September. Reached out to Ms. Marri, her viral infection was on/off but she is doing better now and infection resolved.  Nurse visit will be scheduled on 10/16 @ 3:30 PM for Cimzia injection. This will be the last Cimzia dose coming directly from the mfg.  Future doses will be coming through the [facility] under patient's insurance.      Chelsea Aus

## 2018-10-04 ENCOUNTER — Institutional Professional Consult (permissible substitution): Admit: 2018-10-04 | Discharge: 2018-10-05 | Payer: MEDICARE

## 2018-10-04 DIAGNOSIS — L4050 Arthropathic psoriasis, unspecified: Principal | ICD-10-CM

## 2018-10-04 NOTE — Unmapped (Signed)
Patient presents in clinic for Cimzia 200 mg x 2 SQ.  Injection given in the left and right lower abdomen  Lot# [id number]  Exp:02/2019  No injection site reaction noted  Patient tolerated the medication well  Patient will return in 1 month for the next Cimzia injection.

## 2018-10-19 NOTE — Unmapped (Signed)
[facility] Refill Coordination Note    Specialty Medication(s) to be Shipped:   Inflammatory Disorders: Cimzia    Other medication(s) to be shipped: n/a     [name], DOB: [birth year]  Phone: [phone] (home)       All above HIPAA information was verified with patient.     Completed refill call assessment today to schedule patient's medication shipment from the [facility] [phone]).       Specialty medication(s) and dose(s) confirmed: Regimen is correct and unchanged.   Changes to medications: Naliyah reports no changes reported at this time.  Changes to insurance: No  Questions for the pharmacist: No    The patient will receive a drug information handout for each medication shipped and additional FDA Medication Guides as required.      DISEASE/MEDICATION-SPECIFIC INFORMATION        For Rheumatology patients: Next dose of cimzia from this shipment due on 11/1    ADHERENCE         patient goes to clinic for administration        SHIPPING     Shipping address confirmed in Epic.     Delivery Scheduled: Yes, Expected medication delivery date: 11/11 via UPS or courier.     Medication will be delivered via Same Day Courier to the prescription address in Epic WAM.    Renette Butters   [facility] Specialty Technician

## 2018-10-23 NOTE — Unmapped (Signed)
Patient has new insurance  Medicare part d plan    Methotrexate tablets will need a Part B vs Part D - Pa needed for coverage determination    Putting in referral    cimzia also rejected- needs PA-     Putting in referral    Delivery of cimzia to clinic scheduled 10/30/18 sent message to [facility] to make them aware    Delivery of methorexte tablets (non specialty), lexapro, leucovorin and gabapentin will be on 11/12 directly to her home as long as insurance will process those at that time    Patient is aware of her new insurance requiring Pas    Everlean Cherry CPHT

## 2018-10-30 MED FILL — GABAPENTIN 300 MG CAPSULE: 90 days supply | Qty: 90 | Fill #0 | Status: AC

## 2018-10-30 MED FILL — METHOTREXATE SODIUM 2.5 MG TABLET: 84 days supply | Qty: 96 | Fill #0 | Status: AC

## 2018-10-30 MED FILL — ESCITALOPRAM 10 MG TABLET: ORAL | 90 days supply | Qty: 90 | Fill #0

## 2018-10-30 MED FILL — ESCITALOPRAM 10 MG TABLET: 90 days supply | Qty: 90 | Fill #0 | Status: AC

## 2018-10-30 MED FILL — LEUCOVORIN CALCIUM 5 MG TABLET: 28 days supply | Qty: 8 | Fill #0

## 2018-10-30 MED FILL — CIMZIA 400 MG/2 ML (200 MG/ML X 2) SUBCUTANEOUS SYRINGE KIT: 84 days supply | Qty: 3 | Fill #0 | Status: AC

## 2018-10-30 MED FILL — GABAPENTIN 300 MG CAPSULE: 90 days supply | Qty: 90 | Fill #0

## 2018-10-30 MED FILL — METHOTREXATE SODIUM 2.5 MG TABLET: 84 days supply | Qty: 96 | Fill #0

## 2018-10-30 MED FILL — LEUCOVORIN CALCIUM 5 MG TABLET: 28 days supply | Qty: 8 | Fill #0 | Status: AC

## 2018-11-01 ENCOUNTER — Institutional Professional Consult (permissible substitution): Admit: 2018-11-01 | Discharge: 2018-11-02 | Payer: MEDICARE

## 2018-11-01 DIAGNOSIS — L4050 Arthropathic psoriasis, unspecified: Principal | ICD-10-CM

## 2018-11-01 NOTE — Unmapped (Signed)
Patient presents in clinic for Cimzia 200 mg x 2 SQ.  Injection given in the left and right lower abdomen  Lot# [id number]  Exp:11/2019  No injection site reaction noted  Patient tolerated the medication well  Patient will return in 1 month for the next Cimzia injection.

## 2018-11-07 ENCOUNTER — Institutional Professional Consult (permissible substitution): Admit: 2018-11-07 | Discharge: 2018-11-08

## 2018-11-30 ENCOUNTER — Institutional Professional Consult (permissible substitution): Admit: 2018-11-30 | Discharge: 2018-12-01 | Payer: MEDICARE

## 2018-11-30 DIAGNOSIS — L4050 Arthropathic psoriasis, unspecified: Principal | ICD-10-CM

## 2018-11-30 NOTE — Unmapped (Signed)
Patient presents in clinic for Cimzia 200 mg x 2 SQ.  Injection given in the left and right lower abdomen  Lot# [id number]  Exp:11/2019  No injection site reaction noted  Patient tolerated the medication well  Patient will return in 1 month for the next Cimzia injection.

## 2018-12-19 ENCOUNTER — Ambulatory Visit: Admit: 2018-12-19 | Discharge: 2018-12-19 | Payer: MEDICARE

## 2018-12-19 DIAGNOSIS — Z79899 Other long term (current) drug therapy: Secondary | ICD-10-CM

## 2018-12-19 DIAGNOSIS — M797 Fibromyalgia: Secondary | ICD-10-CM

## 2018-12-19 DIAGNOSIS — L4050 Arthropathic psoriasis, unspecified: Principal | ICD-10-CM

## 2018-12-19 LAB — CBC W/ AUTO DIFF
BASOPHILS ABSOLUTE COUNT: 0 10*9/L (ref 0.0–0.1)
BASOPHILS RELATIVE PERCENT: 0.5 %
EOSINOPHILS ABSOLUTE COUNT: 0.1 10*9/L (ref 0.0–0.4)
EOSINOPHILS RELATIVE PERCENT: 2.4 %
HEMATOCRIT: 39.3 % (ref 36.0–46.0)
LARGE UNSTAINED CELLS: 4 % (ref 0–4)
LYMPHOCYTES ABSOLUTE COUNT: 2.7 10*9/L (ref 1.5–5.0)
LYMPHOCYTES RELATIVE PERCENT: 47.6 %
MEAN CORPUSCULAR HEMOGLOBIN CONC: 30.7 g/dL — ABNORMAL LOW (ref 31.0–37.0)
MEAN CORPUSCULAR HEMOGLOBIN: 29.9 pg (ref 26.0–34.0)
MEAN CORPUSCULAR VOLUME: 97.3 fL (ref 80.0–100.0)
MEAN PLATELET VOLUME: 6.7 fL — ABNORMAL LOW (ref 7.0–10.0)
MONOCYTES ABSOLUTE COUNT: 0.3 10*9/L (ref 0.2–0.8)
MONOCYTES RELATIVE PERCENT: 5.2 %
NEUTROPHILS RELATIVE PERCENT: 40.3 %
PLATELET COUNT: 279 10*9/L (ref 150–440)
RED BLOOD CELL COUNT: 4.04 10*12/L (ref 4.00–5.20)
RED CELL DISTRIBUTION WIDTH: 13.1 % (ref 12.0–15.0)
WBC ADJUSTED: 5.7 10*9/L (ref 4.5–11.0)

## 2018-12-19 LAB — ALT (SGPT): Alanine aminotransferase:CCnc:Pt:Ser/Plas:Qn:: 11

## 2018-12-19 LAB — RED CELL DISTRIBUTION WIDTH: Lab: 13.1

## 2018-12-19 LAB — EGFR CKD-EPI AA FEMALE: Lab: >90

## 2018-12-19 LAB — ALBUMIN: Albumin:MCnc:Pt:Ser/Plas:Qn:: 4.2

## 2018-12-19 LAB — SMEAR REVIEW

## 2018-12-19 LAB — CREATININE: EGFR CKD-EPI NON-AA FEMALE: 86 mL/min/{1.73_m2} (ref >=60)

## 2018-12-19 LAB — AST (SGOT): Aspartate aminotransferase:CCnc:Pt:Ser/Plas:Qn:: 22

## 2018-12-19 MED ORDER — LEUCOVORIN CALCIUM 5 MG TABLET
ORAL_TABLET | ORAL | 3 refills | 0.00000 days | Status: CP
Start: 2018-12-19 — End: ?
  Filled 2019-01-09: qty 36, 84d supply, fill #0

## 2018-12-19 NOTE — Unmapped (Addendum)
Increase methotrexate to 8 pills on Sunday.   Increase leucovorin to 3 pills on Monday.   Stop by the lab on the way out for blood work.

## 2018-12-19 NOTE — Unmapped (Signed)
cimPatient Name: Mary Waters  PCP: Noralyn Pick, FNP      Chief Compliant: Psoriatic arthritis follow-up    HPI: Mary Waters is a 52 y.o. AA female with a history of psoriatic arthritis.  Established care in September 2016.  Ultrasound in 2016 showing small PIP joint effusions, presence of enthesophytes, and achilles tendon inflammation/tendinitis consistent with an underlying spondyloarthropathy such as psoriatic arthritis.    Humira 2016-06/2016 with loss of efficacy  Methotrexate added 01/2016 for persistent peripheral arthritis. Continues this today.  Enbrel 06/2016-09/2017 with loss of efficacy  Cimzia 09/2017 to present. Getting in office injections.     Additional hx of cervical myelopathy for which surgery has been recommended, but she is not planning to pursue this.   She also has clinical evidence of fibromyalgia which is being treated with gabapentin.     Interim history:  Presents today for f/u.     She feels OK today.   Continues to complain of pain in the mid back where her bra strap lies.  Also pain in the R 3rd finger.     She notes more fatigue lately. She got a new bed and a new mattress with some improvement in sleep. She admits that she often falls asleep when watching television around 9 pm, then wakes up around midnight and goes to bed, but has trouble falling back asleep. We discussed going to bed at 9 when she feels tired instead.     She states she has been taking only 15 mg mtx because the 20 mg made her nauseated.  She has a friend that takes mtx injections with better tolerability, but pt cannot self administer injections and is not interested in trying SQ mtx.     ROS??:Attests to the above, otherwise, review of ten system is negative.??    Past Medical and Surgical History:  ??  Patient Active Problem List    Diagnosis Date Noted   ??? Cervical myelopathy (CMS-HCC) 12/09/2016   ??? Psoriatic arthritis (CMS-HCC) 01/07/2016   ??? Menopause 08/07/2015   ??? History of tobacco use 10/23/2014   ??? Fibromyalgia 04/01/2014   ??? Carpal tunnel syndrome of right wrist 04/01/2014   ??? GERD (gastroesophageal reflux disease) 04/01/2014   ??? Chronic pain syndrome 04/01/2014   ??? Morbid obesity due to excess calories (CMS-HCC) 04/01/2014   ??? Psoriasis 04/01/2014   ??? Allergic rhinitis 02/04/2014   ??? Cataracts, both eyes 10/03/2013   ??? Health care maintenance 12/23/2010   ??? IBS (irritable bowel syndrome) 12/20/2008     Past Surgical History:   Procedure Laterality Date   ??? CESAREAN SECTION     ??? DILATION AND CURETTAGE OF UTERUS     ??? HYSTERECTOMY  2001    TAH   ??? KNEE SURGERY     ??? TOTAL ABDOMINAL HYSTERECTOMY       Allergies:   ??  Allergies   Allergen Reactions   ??? Prednisone Hives   ??? Sulfa (Sulfonamide Antibiotics) Hives     Other reaction(s): HIVES  Other reaction(s): HIVES   ??? Sulfasalazine      Other reaction(s): HIVES   ??? Enbrel [Etanercept] Rash     Current Outpatient Medications:  ??  Current Outpatient Medications on File Prior to Visit   Medication Sig Dispense Refill   ??? acetaminophen (TYLENOL) 500 MG tablet Take 1,000 mg by mouth.     ??? baclofen (LIORESAL) 10 MG tablet TAKE 1 TABLET BY MOUTH THREE TIMES DAILY AS  NEEDED FOR MUSCLE SPASMS 270 tablet 3   ??? capsaicin (ZOSTRIX) 0.025 % cream APPLY TOPCICALLY TWICE DAILY 60 g 0   ??? certolizumab pegol (CIMZIA) 400 mg/2 mL (200 mg/mL x 2) SyKt Inject 2 mL (400 mg total) under the skin every twenty-eight (28) days. 3 each 3   ??? clindamycin (CLEOCIN T) 1 % lotion Apply topically Two (2) times a day. To affected bumps until resolved 60 mL 5   ??? escitalopram oxalate (LEXAPRO) 10 MG tablet TAKE 1 TABLET BY MOUTH DAILY 90 tablet 1   ??? fluticasone propionate (FLONASE) 50 mcg/actuation nasal spray USE 1 SPRAY IN EACH NOSTRIL TWICE DAILY 16 g 5   ??? gabapentin (NEURONTIN) 300 MG capsule TAKE 1 CAPSULE (300 MG TOTAL) BY MOUTH NIGHTLY. 90 capsule 3   ??? leucovorin (WELLCOVORIN) 5 mg tablet TAKE 2 TABLETS (10 MG) BY MOUTH ONCE EACH WEEK 12 TO 24 HOURS AFTER METHOTREXATE DOSE 8 tablet 11   ??? loratadine (CLARITIN) 10 mg tablet Take 10 mg by mouth once as needed for allergies.     ??? methotrexate 2.5 MG tablet TAKE 8 TABLETS (20 MG) BY MOUTH ONCE EACH WEEK 96 tablet 3   ??? phenylephrine (SUDAFED PE) 10 MG Tab Take 10 mg by mouth every four (4) hours as needed.     ??? phosphorated carbohydrate (EMETROL) solution Take 15 mL by mouth once as needed for nausea.       No current facility-administered medications on file prior to visit.      PHYSICAL EXAM??  Vitals:    12/19/18 1003   BP: 139/82   BP Site: L Arm   BP Position: Sitting   BP Cuff Size: Large   Pulse: 100   Temp: 36.6 ??C (97.8 ??F)   TempSrc: Oral   Weight: (!) 111.8 kg (246 lb 8 oz)   Height: 172.7 cm (5' 7.99)        General:   Pleasant 52 y.o.female in no acute distress, WDWN   Eyes:   PERRL, conjunctiva and sclera not inflamed. Tears appear adequate.    ENT:   No oropharyngeal lesions. Mucous membranes moist.    Lymph:   No masses or cervical lymphadenopathy.    Cardiovascular:  Regular rate and rhythm. No murmur, rub, or gallop. No lower extremity edema.    Lungs:  Clear to auscultation.Normal respiratory effort.    Musculoskeletal:   General: Ambulates w/o assistance   Hands: Tenderness of all MCPs on the R. Slight stiffness in grip bilaterally.  Wrists: Reduced ROM b/l w/o pain  Elbows: Flexion contracture of the left  Shoulders: Full range of motion bilaterally with pain on the right  Spine: Pain in anterior right hip and lateral left hip with Faber maneuver.  Occipital wall 0 cm.  Modified Schober with 3.5 cm difference.  Tenderness over midline thoracic spine.  Hips: Full flexion  Knees: Range motion without effusions.  Crepitus bilaterally.  Ankles: No swelling or tenderness.  No swelling over Achilles tendon insertion.  Feet: Enlargement of the fourth IP on the right without tenderness.  No pain with MTP squeeze.   Neurological:  CN 2-12 grossly intact. 5/5 strength on extremities.   Psych:  Appropriate affect and mood   Skin:   Slight hyperpigmentation and flaking over the forehead at hairline.           IMAGING??   EXAM: HAND, TWO VIEWS, BILATERAL  DATE: 01/21/15 11:53:03  ACCESSION: [id number] UN  DICTATED: 01/21/15 14:42:11  COMPARISON: None.  TECHNIQUE: PA and Norgaard views of the hands.  FINDINGS: No fracture or dislocation in either hand.  There is diffuse bilateral periarticular osteopenia is within the hands. Soft tissues are normal. No joint space narrowing, osteophytosis or chondrocalcinosis. No erosions are seen.  Small calcific fragment projects at the radial aspect of the right third finger DIP joint, possibly sequelae of prior trauma.  IMPRESSION: Bilateral, diffuse periarticular osteopenia of the hands. No evidence for erosive disease.    [id number] UN 09/18/15 ??11:43:23 IMG150 [facility]) : XR FOOT 3 OR MORE VIEWS BILATERAL  EXAM: FOOT COMPLETE BILATERAL  Dorsal-plantar, oblique and lateral nonweightbearing views of the feet are presented for interpretation 09/18/15. ??No prior studies available for comparison.  No fracture is seen. Jointline is normal. Joint spaces are normal. No erosions are seen. Extensive enthesophytes are noted involving the calcaneus base of the fifth metatarsal and the first metatarsal sesamoids bilaterally. Heterotopic bone is noted in the dorsal talonavicular joints bilaterally; given calcification of the dorsal right talonavicular joint capsule these findings are likely posttraumatic. Soft tissues are normal.  IMPRESSION: Extensive bilateral foot enthesopathic changes which can be seen with the seronegative spondyloarthropathies such as psoriatic and reactive arthritis.    ??GENERAL SUMMARY/IMPRESSION AND RECOMMENDATIONS: ??  1. Psoriatic arthritis (CMS-HCC)  Some concern for persistent peripheral arthritis. She has been taking only 15 mg mtx due to difficulty tolerating 20 mg. We agreed to try resuming 20 mg mtx qwk with increase in leucovorin to 15 mg qwk 12-24 hours after mtx dose. She will continue cimzia injections in office 400 mg q mo.     2. Methotrexate, long term, current use  Order for monitoring labs in place, she forgot to do these in the interim. She will complete today.   - Albumin; Future  - AST; Future  - ALT; Future  - CBC w/ Differential; Future  - Creatinine; Future    3. Fibromyalgia  Symptomatic but stable. Discussed the importance of good restorative sleep. Discussed sleep hygiene measures.        HCM:   -PCV13 Status:10/23/15  -PPSV 23 Status:03/04/16  -Annual Influenza vaccine. Status: 09/2018 with PCP per pt  - Bone health: not on prednisone   - Contraception: s/p hysterectomy          >25 min spent in consultation with pt, >50% of which was spent discussing diagnosis and treatment options.   RTC 4 mo with Dr Scarlette Calico

## 2019-01-03 NOTE — Unmapped (Signed)
[facility] Refill Coordination Note    Specialty Medication(s) to be Shipped:   Inflammatory Disorders: Cimzia going to clinic on 1/22 routing to [facility]    Other medication(s) to be shipped: methotrexate and leucovorin going to her home address     [name], DOB: [birth year]  Phone: [phone] (home)   Clinic for [facility] address for others    All above HIPAA information was verified with patient.     Completed refill call assessment today to schedule patient's medication shipment from the [facility] [phone]).       Specialty medication(s) and dose(s) confirmed: Regimen is correct and unchanged.   Changes to medications: Opel reports no changes reported at this time.  Changes to insurance: No  Questions for the pharmacist: No    Confirmed patient received Welcome Packet with first shipment. The patient will receive a drug information handout for each medication shipped and additional FDA Medication Guides as required.       DISEASE/MEDICATION-SPECIFIC INFORMATION        For patients on injectable medications: Patient currently has 1 doses left.  Next injection is scheduled for tomorrow.    SPECIALTY MEDICATION ADHERENCE     cimzia 400mg /39ml sykt: patient has 28 days of medication at the clinic       [facility]     Shipping address confirmed in Epic.     Delivery Scheduled: Yes, Expected medication delivery date: 1/22.     Medication will be delivered via Clinic courier for Cimzia to Rheumatology clinic [address] [place] [address]; others going to home address [address] [place] [address]  to the home/prescription address in Epic WAM.    Renette Butters   [facility] Specialty Technician

## 2019-01-04 ENCOUNTER — Institutional Professional Consult (permissible substitution): Admit: 2019-01-04 | Discharge: 2019-01-05 | Payer: MEDICARE

## 2019-01-04 DIAGNOSIS — L4050 Arthropathic psoriasis, unspecified: Principal | ICD-10-CM

## 2019-01-04 NOTE — Unmapped (Signed)
Patient presents in clinic for Cimzia 200 mg x 2 SQ.  Injection given in the left and right lower abdomen  Lot# [id number]  Exp:12/20/2019  No injection site reaction noted  Patient tolerated the medication well  Patient will return in 1 month for the next Cimzia injection.

## 2019-01-09 MED FILL — METHOTREXATE SODIUM 2.5 MG TABLET: 84 days supply | Qty: 96 | Fill #1

## 2019-01-09 MED FILL — METHOTREXATE SODIUM 2.5 MG TABLET: 84 days supply | Qty: 96 | Fill #1 | Status: AC

## 2019-01-09 MED FILL — LEUCOVORIN CALCIUM 5 MG TABLET: 84 days supply | Qty: 36 | Fill #0 | Status: AC

## 2019-01-10 MED FILL — CIMZIA 400 MG/2 ML (200 MG/ML X 2) SUBCUTANEOUS SYRINGE KIT: 84 days supply | Qty: 3 | Fill #1 | Status: AC

## 2019-01-10 MED FILL — CIMZIA 400 MG/2 ML (200 MG/ML X 2) SUBCUTANEOUS SYRINGE KIT: SUBCUTANEOUS | 84 days supply | Qty: 3 | Fill #1

## 2019-02-01 NOTE — Unmapped (Signed)
Fill on 1/22 was for 84 day supply for cimzia    Rescheduling refill call    Riane Rung CPHT

## 2019-02-06 ENCOUNTER — Institutional Professional Consult (permissible substitution): Admit: 2019-02-06 | Discharge: 2019-02-07 | Payer: MEDICARE

## 2019-02-06 DIAGNOSIS — L4050 Arthropathic psoriasis, unspecified: Principal | ICD-10-CM

## 2019-02-06 NOTE — Unmapped (Signed)
Patient presents in clinic for Cimzia 200 mg x 2 SQ.  Injection given in the left and right lower abdomen  Lot# [id number]  Exp: 03/2020  No injection site reaction noted  Patient tolerated the medication well  Patient will return in 1 month for the next Cimzia injection.

## 2019-02-08 ENCOUNTER — Ambulatory Visit: Admit: 2019-02-08 | Discharge: 2019-02-08 | Payer: MEDICARE

## 2019-02-08 DIAGNOSIS — Z1231 Encounter for screening mammogram for malignant neoplasm of breast: Principal | ICD-10-CM

## 2019-02-19 DIAGNOSIS — Z1231 Encounter for screening mammogram for malignant neoplasm of breast: Principal | ICD-10-CM
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
## 2019-03-07 ENCOUNTER — Institutional Professional Consult (permissible substitution): Admit: 2019-03-07 | Discharge: 2019-03-08

## 2019-03-07 DIAGNOSIS — L4050 Arthropathic psoriasis, unspecified: Principal | ICD-10-CM

## 2019-03-07 NOTE — Unmapped (Signed)
Patient presents in clinic for Cimzia 200 mg x 2 SQ.  Injection given in RLQ(200 mg) and LLq(200 mg)  Lot#: [id number]   Exp: 03/2020  No injection site reaction noted  Patient tolerated the medication well  Patient will return in 1 month for the next Cimzia injection.

## 2019-04-03 ENCOUNTER — Institutional Professional Consult (permissible substitution): Admit: 2019-04-03 | Discharge: 2019-04-04 | Payer: MEDICARE | Attending: Internal Medicine | Primary: Internal Medicine

## 2019-04-03 DIAGNOSIS — L4050 Arthropathic psoriasis, unspecified: Principal | ICD-10-CM

## 2019-04-03 MED ORDER — BACLOFEN 10 MG TABLET
ORAL_TABLET | ORAL | 3 refills | 0 days | Status: CP
Start: 2019-04-03 — End: 2020-04-02
  Filled 2019-04-04: qty 270, 90d supply, fill #0

## 2019-04-03 NOTE — Unmapped (Signed)
Patient presents in clinic for Cimzia 200 mg x 2 SQ.  Injection given in the left and right lower abdomen  Lot# [id number]  Exp:02/2020  No injection site reaction noted  Patient tolerated the medication well  Patient will return in 1 month for the next Cimzia injection.

## 2019-04-03 NOTE — Unmapped (Signed)
Muscle Relaxant refill  Last ov: 12/19/2018  Next ov: 05/03/2019

## 2019-04-04 MED ORDER — ESCITALOPRAM 10 MG TABLET
ORAL_TABLET | ORAL | 1 refills | 0 days | Status: CP
Start: 2019-04-04 — End: 2019-06-20
  Filled 2019-04-04: qty 90, 90d supply, fill #0

## 2019-04-04 MED FILL — ESCITALOPRAM 10 MG TABLET: 90 days supply | Qty: 90 | Fill #0 | Status: AC

## 2019-04-04 MED FILL — LEUCOVORIN CALCIUM 5 MG TABLET: 84 days supply | Qty: 36 | Fill #1 | Status: AC

## 2019-04-04 MED FILL — GABAPENTIN 300 MG CAPSULE: 90 days supply | Qty: 90 | Fill #1 | Status: AC

## 2019-04-04 MED FILL — BACLOFEN 10 MG TABLET: 90 days supply | Qty: 270 | Fill #0 | Status: AC

## 2019-04-04 MED FILL — GABAPENTIN 300 MG CAPSULE: 90 days supply | Qty: 90 | Fill #1

## 2019-04-04 MED FILL — LEUCOVORIN CALCIUM 5 MG TABLET: ORAL | 84 days supply | Qty: 36 | Fill #1

## 2019-04-04 MED FILL — METHOTREXATE SODIUM 2.5 MG TABLET: 84 days supply | Qty: 96 | Fill #2

## 2019-04-04 MED FILL — METHOTREXATE SODIUM 2.5 MG TABLET: 84 days supply | Qty: 96 | Fill #2 | Status: AC

## 2019-04-11 ENCOUNTER — Telehealth: Admit: 2019-04-11 | Discharge: 2019-04-12 | Payer: MEDICARE

## 2019-04-11 DIAGNOSIS — L4050 Arthropathic psoriasis, unspecified: Principal | ICD-10-CM

## 2019-04-11 DIAGNOSIS — M546 Pain in thoracic spine: Secondary | ICD-10-CM

## 2019-04-11 DIAGNOSIS — G8929 Other chronic pain: Secondary | ICD-10-CM

## 2019-04-11 MED ORDER — MELOXICAM 15 MG TABLET
ORAL_TABLET | Freq: Every day | ORAL | 0 refills | 0 days | Status: CP
Start: 2019-04-11 — End: 2019-07-03
  Filled 2019-04-12: qty 90, 90d supply, fill #0

## 2019-04-11 MED ORDER — CYCLOBENZAPRINE 10 MG TABLET
ORAL_TABLET | Freq: Three times a day (TID) | ORAL | 0 refills | 0 days | Status: CP | PRN
Start: 2019-04-11 — End: 2019-07-03
  Filled 2019-04-12: qty 90, 30d supply, fill #0
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
## 2019-04-11 NOTE — Unmapped (Signed)
Nice to see you today!    1- Start meloxicam 1 pill daily for pain. Take this daily for 2 weeks, then you can just use as needed.   2- Stop baclofen for now. Start flexeril (cyclobenzaprine) three times daily as needed for pain/spasms.   3- Once your back pain is back to normal, you can consider stopping the flexeril, and resuming the baclofen as needed. You should not take both medicaitons together.   4- For acid reflux, start zantac (ranitidine) over the counter, 150 mg in the morning and the evening.   5- We can consider getting an x ray of your thoracic spine at your appointment in July if you are still having pain.

## 2019-04-12 MED FILL — MELOXICAM 15 MG TABLET: 90 days supply | Qty: 90 | Fill #0 | Status: AC

## 2019-04-12 MED FILL — CYCLOBENZAPRINE 10 MG TABLET: 30 days supply | Qty: 90 | Fill #0 | Status: AC

## 2019-04-16 NOTE — Unmapped (Signed)
[facility] Clinical Assessment & Refill Coordination Note    Mary Waters, DOB: Jun 04, 1966  Phone: [phone] (home)     All above HIPAA information was verified with patient.     Specialty Medication(s):   Inflammatory Disorders: Cimzia     Current Outpatient Medications   Medication Sig Dispense Refill   ??? acetaminophen (TYLENOL) 500 MG tablet Take 1,000 mg by mouth.     ??? baclofen (LIORESAL) 10 MG tablet TAKE 1 TABLET BY MOUTH THREE TIMES DAILY AS NEEDED FOR MUSCLE SPASMS 270 tablet 3   ??? capsaicin (ZOSTRIX) 0.025 % cream APPLY TOPCICALLY TWICE DAILY 60 g 0   ??? certolizumab pegoL (CIMZIA) 400 mg/2 mL (200 mg/mL x 2) SyKt Inject 2 mL (400 mg total) under the skin every twenty-eight (28) days. 3 each 3   ??? clindamycin (CLEOCIN T) 1 % lotion Apply topically Two (2) times a day. To affected bumps until resolved 60 mL 5   ??? cyclobenzaprine (FLEXERIL) 10 MG tablet Take 1 tablet (10 mg total) by mouth Three (3) times a day as needed for muscle spasms. 90 tablet 0   ??? escitalopram oxalate (LEXAPRO) 10 MG tablet TAKE 1 TABLET BY MOUTH DAILY 90 tablet 1   ??? fluticasone propionate (FLONASE) 50 mcg/actuation nasal spray USE 1 SPRAY IN EACH NOSTRIL TWICE DAILY 16 g 5   ??? gabapentin (NEURONTIN) 300 MG capsule TAKE 1 CAPSULE (300 MG TOTAL) BY MOUTH NIGHTLY. 90 capsule 3   ??? leucovorin (WELLCOVORIN) 5 mg tablet Take 3 tablets (15 mg total) by mouth every seven (7) days. Take 12-24 hours after methotrexate. 36 tablet 3   ??? loratadine (CLARITIN) 10 mg tablet Take 10 mg by mouth once as needed for allergies.     ??? meloxicam (MOBIC) 15 MG tablet Take 1 tablet (15 mg total) by mouth daily. 90 tablet 0   ??? methotrexate 2.5 MG tablet TAKE 8 TABLETS (20 MG) BY MOUTH ONCE EACH WEEK 96 tablet 3   ??? phenylephrine (SUDAFED PE) 10 MG Tab Take 10 mg by mouth every four (4) hours as needed.     ??? phosphorated carbohydrate (EMETROL) solution Take 15 mL by mouth once as needed for nausea.       No current facility-administered medications for this visit.         Changes to medications: Shaquina reports no changes at this time.    Allergies   Allergen Reactions   ??? Prednisone Hives   ??? Sulfa (Sulfonamide Antibiotics) Hives     Other reaction(s): HIVES  Other reaction(s): HIVES   ??? Sulfasalazine      Other reaction(s): HIVES   ??? Enbrel [Etanercept] Rash       Changes to allergies: No    SPECIALTY MEDICATION ADHERENCE     Cimzia 200 mg/ml: 0 days of medicine on hand     Medication Adherence    Patient reported X missed doses in the last month:  0  Specialty Medication:  Cimzia 200mg /ml          Specialty medication(s) dose(s) confirmed: Regimen is correct and unchanged.     Are there any concerns with adherence? No    Adherence counseling provided? Not needed    CLINICAL MANAGEMENT AND INTERVENTION      Clinical Benefit Assessment:    Do you feel the medicine is effective or helping your condition? Yes    Clinical Benefit counseling provided? Not needed    Adverse Effects Assessment:  Are you experiencing any side effects? No    Are you experiencing difficulty administering your medicine? No    Quality of Life Assessment:    How many days over the past month did your psoriatic arthritis keep you from your normal activities? For example, brushing your teeth or getting up in the morning. Patient declined to answer    Have you discussed this with your provider? Not needed    Therapy Appropriateness:    Is therapy appropriate? Yes, therapy is appropriate and should be continued    DISEASE/MEDICATION-SPECIFIC INFORMATION      For patients on injectable medications: Patient currently has 0 doses left.  Next injection is scheduled for 05/03/2019.    PATIENT SPECIFIC NEEDS     ? Does the patient have any physical, cognitive, or cultural barriers? No    ? Is the patient high risk? No     ? Does the patient require a Care Management Plan? No     ? Does the patient require physician intervention or other additional services (i.e. nutrition, smoking cessation, social work)? No      SHIPPING     Specialty Medication(s) to be Shipped:   Inflammatory Disorders: Cimzia 200mg /ml    Other medication(s) to be shipped: none       Changes to insurance: No    Delivery Scheduled: Yes, Expected medication delivery date: 04/25/2019.     Medication will be delivered via [facility] - Rheumatology clinic to the confirmed prescription address in [facility].    The patient will receive a drug information handout for each medication shipped and additional FDA Medication Guides as required.  Verified that patient has previously received a Conservation officer, historic buildings.    Karene Fry Geovani Tootle   [facility] Specialty Pharmacist

## 2019-04-25 MED FILL — CIMZIA 400 MG/2 ML (200 MG/ML X 2) SUBCUTANEOUS SYRINGE KIT: SUBCUTANEOUS | 84 days supply | Qty: 3 | Fill #2

## 2019-04-25 MED FILL — CIMZIA 400 MG/2 ML (200 MG/ML X 2) SUBCUTANEOUS SYRINGE KIT: 84 days supply | Qty: 3 | Fill #2 | Status: AC

## 2019-05-03 ENCOUNTER — Institutional Professional Consult (permissible substitution): Admit: 2019-05-03 | Discharge: 2019-05-04 | Payer: MEDICARE

## 2019-05-03 DIAGNOSIS — L4050 Arthropathic psoriasis, unspecified: Principal | ICD-10-CM

## 2019-05-03 NOTE — Unmapped (Signed)
Patient presents in clinic for Cimzia 200 mg x 2 SQ.  Injection given in  Lot#: [id number]  Exp: 03/2020  No injection site reaction noted.  Patient tolerated the medication well.  Patient will return in 1 month for the next Cimzia injection.

## 2019-06-05 ENCOUNTER — Institutional Professional Consult (permissible substitution): Admit: 2019-06-05 | Discharge: 2019-06-06 | Payer: MEDICARE

## 2019-06-05 DIAGNOSIS — L4050 Arthropathic psoriasis, unspecified: Principal | ICD-10-CM

## 2019-06-05 NOTE — Unmapped (Signed)
Patient presents in clinic for Cimzia 200 mg x 2 SQ.  Injection given in the left and right lower abdomen  Lot#: [id number]  Exp:08/2020  No injection site reaction noted.  Patient tolerated the medication well.  Patient will return in 1 month for the next Cimzia injection.
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
## 2019-06-20 ENCOUNTER — Ambulatory Visit: Admit: 2019-06-20 | Discharge: 2019-06-21 | Payer: MEDICARE

## 2019-06-20 ENCOUNTER — Ambulatory Visit: Admit: 2019-06-20 | Discharge: 2019-06-21 | Payer: MEDICARE | Attending: Family | Primary: Family

## 2019-06-20 DIAGNOSIS — F419 Anxiety disorder, unspecified: Secondary | ICD-10-CM

## 2019-06-20 DIAGNOSIS — L739 Follicular disorder, unspecified: Secondary | ICD-10-CM

## 2019-06-20 DIAGNOSIS — L403 Pustulosis palmaris et plantaris: Principal | ICD-10-CM

## 2019-06-20 DIAGNOSIS — J301 Allergic rhinitis due to pollen: Principal | ICD-10-CM

## 2019-06-20 DIAGNOSIS — K219 Gastro-esophageal reflux disease without esophagitis: Secondary | ICD-10-CM

## 2019-06-20 MED ORDER — ESCITALOPRAM 10 MG TABLET
ORAL_TABLET | ORAL | 3 refills | 0 days | Status: CP
Start: 2019-06-20 — End: 2020-06-19
  Filled 2019-06-20: qty 90, 90d supply, fill #0

## 2019-06-20 MED ORDER — LORATADINE 10 MG TABLET
ORAL_TABLET | Freq: Once | ORAL | 3 refills | 0.00000 days | Status: CP | PRN
Start: 2019-06-20 — End: ?
  Filled 2019-06-20: qty 90, 90d supply, fill #0

## 2019-06-20 MED ORDER — CLINDAMYCIN 1 % LOTION
Freq: Two times a day (BID) | TOPICAL | 5 refills | 0.00000 days | Status: CP
Start: 2019-06-20 — End: ?
  Filled 2019-06-20: qty 60, 30d supply, fill #0

## 2019-06-20 MED ORDER — CLOBETASOL 0.05 % TOPICAL OINTMENT
Freq: Two times a day (BID) | TOPICAL | 1 refills | 0.00000 days | Status: CP
Start: 2019-06-20 — End: 2020-06-19
  Filled 2019-06-20: qty 60, 30d supply, fill #0

## 2019-06-20 MED FILL — CLINDAMYCIN 1 % LOTION: 30 days supply | Qty: 60 | Fill #0 | Status: AC

## 2019-06-20 MED FILL — CLOBETASOL 0.05 % TOPICAL OINTMENT: 30 days supply | Qty: 60 | Fill #0 | Status: AC

## 2019-06-20 MED FILL — ESCITALOPRAM 10 MG TABLET: 90 days supply | Qty: 90 | Fill #0 | Status: AC

## 2019-06-20 MED FILL — LORATADINE 10 MG TABLET: 90 days supply | Qty: 90 | Fill #0 | Status: AC

## 2019-06-20 NOTE — Unmapped (Signed)
Famotidine (Pepcid) 10 or 20 mg twice daily as needed for acid reflux or heartburn.

## 2019-06-20 NOTE — Unmapped (Addendum)
We recommend Panoxyl body wash for your back and chest            Use the steroid medicine to the palms twice daily for the next 2-3 weeks or until  Your palms are clear (whichever comes faster)
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
## 2019-07-03 ENCOUNTER — Telehealth: Admit: 2019-07-03 | Discharge: 2019-07-04 | Payer: MEDICARE

## 2019-07-03 DIAGNOSIS — L4050 Arthropathic psoriasis, unspecified: Principal | ICD-10-CM

## 2019-07-03 DIAGNOSIS — G8929 Other chronic pain: Secondary | ICD-10-CM

## 2019-07-03 DIAGNOSIS — Z79899 Other long term (current) drug therapy: Secondary | ICD-10-CM

## 2019-07-03 DIAGNOSIS — M546 Pain in thoracic spine: Secondary | ICD-10-CM

## 2019-07-03 DIAGNOSIS — M797 Fibromyalgia: Secondary | ICD-10-CM

## 2019-07-03 DIAGNOSIS — L403 Pustulosis palmaris et plantaris: Secondary | ICD-10-CM
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
## 2019-07-12 ENCOUNTER — Ambulatory Visit: Admit: 2019-07-12 | Discharge: 2019-07-12 | Payer: MEDICARE

## 2019-07-12 ENCOUNTER — Institutional Professional Consult (permissible substitution): Admit: 2019-07-12 | Discharge: 2019-07-12 | Payer: MEDICARE

## 2019-07-12 DIAGNOSIS — Z79899 Other long term (current) drug therapy: Principal | ICD-10-CM

## 2019-07-12 DIAGNOSIS — L4050 Arthropathic psoriasis, unspecified: Principal | ICD-10-CM

## 2019-07-12 LAB — AST (SGOT): Aspartate aminotransferase:CCnc:Pt:Ser/Plas:Qn:: 29

## 2019-07-12 LAB — AST: AST (SGOT): 29 U/L (ref 17–47)

## 2019-07-12 LAB — CBC W/ AUTO DIFF
BASOPHILS ABSOLUTE COUNT: 0 10*9/L (ref 0.0–0.1)
BASOPHILS RELATIVE PERCENT: 0.6 %
EOSINOPHILS ABSOLUTE COUNT: 0.1 10*9/L (ref 0.0–0.4)
EOSINOPHILS RELATIVE PERCENT: 2.3 %
HEMATOCRIT: 39 % (ref 36.0–46.0)
HEMOGLOBIN: 12 g/dL (ref 12.0–16.0)
LARGE UNSTAINED CELLS: 3 % (ref 0–4)
LYMPHOCYTES ABSOLUTE COUNT: 3 10*9/L (ref 1.5–5.0)
LYMPHOCYTES RELATIVE PERCENT: 53 %
MEAN CORPUSCULAR HEMOGLOBIN CONC: 30.7 g/dL — ABNORMAL LOW (ref 31.0–37.0)
MEAN CORPUSCULAR HEMOGLOBIN: 29.4 pg (ref 26.0–34.0)
MEAN CORPUSCULAR VOLUME: 95.6 fL (ref 80.0–100.0)
MONOCYTES ABSOLUTE COUNT: 0.4 10*9/L (ref 0.2–0.8)
MONOCYTES RELATIVE PERCENT: 6.6 %
NEUTROPHILS RELATIVE PERCENT: 34.2 %
PLATELET COUNT: 312 10*9/L (ref 150–440)
RED BLOOD CELL COUNT: 4.08 10*12/L (ref 4.00–5.20)
RED CELL DISTRIBUTION WIDTH: 13.2 % (ref 12.0–15.0)

## 2019-07-12 LAB — EGFR CKD-EPI NON-AA FEMALE: Lab: 84

## 2019-07-12 LAB — ALBUMIN: Albumin:MCnc:Pt:Ser/Plas:Qn:: 4.3

## 2019-07-12 LAB — CREATININE
CREATININE: 0.81 mg/dL (ref 0.60–1.00)
EGFR CKD-EPI NON-AA FEMALE: 84 mL/min/{1.73_m2} (ref >=60)

## 2019-07-12 LAB — MEAN CORPUSCULAR HEMOGLOBIN: Lab: 29.4

## 2019-07-12 LAB — ALT (SGPT): Alanine aminotransferase:CCnc:Pt:Ser/Plas:Qn:: 14

## 2019-07-12 NOTE — Unmapped (Signed)
Patient presents in clinic for Cimzia 200 mg x 2 SQ.  Injection given in bilateral lower abdomen  Lot#: [id number]  Exp: 08/2020  No injection site reaction noted.  Patient tolerated the medication well.  Patient will return in 1 month for the next Cimzia injection, granted injections approved per Dermatology (seeing them in August)

## 2019-08-02 ENCOUNTER — Ambulatory Visit: Admit: 2019-08-02 | Discharge: 2019-08-03 | Payer: MEDICARE

## 2019-08-02 DIAGNOSIS — L608 Other nail disorders: Secondary | ICD-10-CM

## 2019-08-02 DIAGNOSIS — L219 Seborrheic dermatitis, unspecified: Secondary | ICD-10-CM

## 2019-08-02 DIAGNOSIS — Z79899 Other long term (current) drug therapy: Principal | ICD-10-CM

## 2019-08-02 DIAGNOSIS — L409 Psoriasis, unspecified: Secondary | ICD-10-CM

## 2019-08-02 DIAGNOSIS — L739 Follicular disorder, unspecified: Secondary | ICD-10-CM

## 2019-08-02 DIAGNOSIS — L403 Pustulosis palmaris et plantaris: Secondary | ICD-10-CM

## 2019-08-02 NOTE — Unmapped (Signed)
Assessment and Plan:    1. 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 We discussed treatment options at length. Reviewed that she may be a good candidate for a IL-17 inhibitor such as secukinumab given her suboptimal control on Cimzia. Will message her rheumatology team for discussion.  - asked that she please have quantiferon gold repeated today  - continue clobetasol ointment BID x 2-3 weeks to affected areas for now  - will send her MyChart message with updates once labs available and consensus about next steps    2. Mild seborrheic dermatitis/possible early central centrifugal scarring alopecia on vertex scalp. No obvious patches of hair loss but some perifollicular scale. Asked that she please use her clobetasol ointment to this area the night before she washes her hair. Could add clobetasol solution if desired in the future but she declined for now.    3. Physiologic melanonychia of her toenails. No signs of nail psoriasis. No treatment required.    4. Folliculitis, post-inflammatory hyperpigmentation. Continue benzoyl peroxide wash several times per week.       Plan for RV in 2 months.     Chief Complaint: follow-up palmoplantar pustulosis    HPI: Mary Waters is a pleasant 53 y.o. female with a history of psoriasis and psoriatic arthritis who was last seen in our clinic on 06/20/2019. At that time we discussed a diagnosis of palmoplantar pustulosis and recommended clobetasol ointment. She notes that this is helpful but when she stops her symptoms will quickly recur on her palms. This is tender and limits her ability to use her hands fully. She is also worried that she is developing some roughness of her skin on her low back and hips. She has been using clobetasol for this as well. She is currently managing her psoriasis/psoriatic arthritis through rheumatology with Cimzia and methotrexate.  She continues to have some swelling and stiffness of her fingers and toes.     She also has questions about hair loss on the top of her scalp. She denies other family members with similar symptoms. She has not tried treating this in any way. She washes her hair about once weekly. She is curious if this is related to methotrexate.     She also has questions about discoloration of her toenails. She recently removed toenail polish as requested so this could be evaluated today and noticed the discoloration. She is unsure how long this has been the case.       No other new, changing, or symptomatic lesions of concern are reported today.    Pertinent Past Medical History:    Psoriasis, psoriatic arthritis -- previously tried Humira, Enbrel, now on Cimzia and methotrexate since October 2018    Medications:    Current Outpatient Medications on File Prior to Visit   Medication Sig Dispense Refill   ??? acetaminophen (TYLENOL) 500 MG tablet Take 1,000 mg by mouth.     ??? baclofen (LIORESAL) 10 MG tablet TAKE 1 TABLET BY MOUTH THREE TIMES DAILY AS NEEDED FOR MUSCLE SPASMS 270 tablet 3   ??? certolizumab pegoL (CIMZIA) 400 mg/2 mL (200 mg/mL x 2) SyKt Inject 2 mL (400 mg total) under the skin every twenty-eight (28) days. 3 each 3   ??? clindamycin (CLEOCIN T) 1 % lotion Apply topically Two (2) times a day to affected bumps until resolved 60 mL 5   ??? clobetasoL (TEMOVATE) 0.05 % ointment Apply topically Two (2) times a day to palms 60 g  1   ??? escitalopram oxalate (LEXAPRO) 10 MG tablet Take 1 tablet by mouth daily 90 tablet 3   ??? leucovorin (WELLCOVORIN) 5 mg tablet Take 3 tablets (15 mg total) by mouth every seven (7) days. Take 12-24 hours after methotrexate. 36 tablet 3   ??? loratadine (CLARITIN) 10 mg tablet Take 1 tablet (10 mg total) by mouth once as needed for allergies. 90 tablet 3   ??? menthol/camphor (CAMPHOR-MENTHOL) 4-10 % Crea Apply topically.     ??? phenylephrine (SUDAFED PE) 10 MG Tab Take 10 mg by mouth every four (4) hours as needed.     ??? phosphorated carbohydrate (EMETROL) solution Take 15 mL by mouth once as needed for nausea.     ??? cyclobenzaprine (FLEXERIL) 10 MG tablet Take 10 mg by mouth once as needed for muscle spasms. Takes only with severe symptoms, usually taking baclofen.     ??? gabapentin (NEURONTIN) 300 MG capsule TAKE 1 CAPSULE (300 MG TOTAL) BY MOUTH NIGHTLY. 90 capsule 3   ??? [EXPIRED] methotrexate 2.5 MG tablet TAKE 8 TABLETS (20 MG) BY MOUTH ONCE EACH WEEK (Patient taking differently: 15 mg once a week. ) 96 tablet 3     No current facility-administered medications on file prior to visit.        Allergies: Prednisone, Sulfa (sulfonamide antibiotics), Sulfasalazine, and Enbrel [etanercept]    Review of Systems: Denies fevers, chills. No nausea, vomiting, diarrhea. No other skin complaints.     Physical Examination:  General: Well-developed, well-nourished, no acute distress.   Neuro : Alert and oriented, answers questions appropriately.  Mouth: no lesions of lips or oral mucosa  Ext: there is thickening of her right first toenail, there is diffuse brown color of her 1-5 toes on her right foot, and 1-3 and 5th toes on the left  Skin: Per patient request, focused exam with inspection and palpation of the scalp, face, neck, back, bilateral upper extremities, bilateral lower extremities was performed today. Findings were normal with exception of the following:  There are thin erythematous plaques on her left low back and buttock. There are clusters of brown macules and scaling on her bilateral palms. There appears to be shorter hair growth and some perifollicular scale on her vertex scalp but no alopecia. There are brown macules on her back consistent with post-inflammatory hyperpigmentation at previous sites of folliculitis.

## 2019-08-02 NOTE — Unmapped (Signed)
Patient Education        secukinumab  Pronunciation:  SEK ue KIN ue mab  Brand:  Cosentyx  What is the most important information I should know about secukinumab?  Follow all directions on your medicine label and package. Tell each of your healthcare providers about all your medical conditions, allergies, and all medicines you use.  What is secukinumab?  Secukinumab is an immunosuppressant that is used to treat moderate to severe plaque psoriasis, active psoriatic arthritis, or active ankylosing spondylitis.  Secukinumab may also be used for purposes not listed in this medication guide.  What should I discuss with my healthcare provider before using secukinumab?  You should not use secukinumab if you are allergic to it.  Secukinumab is not approved for use by anyone younger than 52 years old.  Tell your doctor if you have ever had tuberculosis or if anyone in your household has tuberculosis. Also tell your doctor if you have recently traveled. Tuberculosis and some fungal infections are more common in certain parts of the world, and you may have been exposed during travel.  Before you start treatment with secukinumab, your doctor may perform tests to make sure you do not have tuberculosis or other infections.  Tell your doctor if you have ever had:  ?? a chronic infection;  ?? inflammatory bowel disease (Crohn's disease or ulcerative colitis);  ?? allergy to latex; or  ?? if you currently have signs of infection such as fever, sweats, chills, muscle pain, cough, shortness of breath, cough with bloody mucus, weight loss, skin sores, stomach pain, diarrhea, or painful urination.  Make sure you are current on all vaccines before you begin treatment with secukinumab.  Tell your doctor if you are pregnant or breastfeeding.  How should I use secukinumab?  Follow all directions on your prescription label and read all medication guides or instruction sheets. Use the medicine exactly as directed.  Secukinumab is injected under the skin. A healthcare provider may teach you how to properly use the medication by yourself.  Read and carefully follow any Instructions for Use provided with your medicine. You may need to use 2 injections to get your total dose. Ask your doctor or pharmacist if you don't understand all instructions.  Your care provider will show you where on your body to inject secukinumab. Do not inject into skin with active psoriasis, or skin that is red, bruised, or tender. Do not  inject within 2 inches of your navel (belly button).  Secukinumab should be clear or light-yellow. Do not use if the medicine looks cloudy, has changed colors, or has particles in it. Call your pharmacist for new medicine.  Store this medicine in the original container in a refrigerator. Protect from light and do not shake or freeze. Take the medicine out of the refrigerator and allow it to reach room temperature for 15 to 30 minutes before injecting your dose. Give the injection within 1 hour after removing the medicine from a refrigerator.  Use a needle and syringe only once and then place them in a puncture-proof sharps container. Follow state or local laws about how to dispose of this container. Keep it out of the reach of children and pets.  Each prefilled syringe or injection pen is for one use only. Throw it away after one use, even if there is still medicine left inside.  What happens if I miss a dose?  Call your doctor for instructions if you miss a dose of secukinumab.  What  happens if I overdose?  Seek emergency medical attention or call the Poison Help line at [phone].  What should I avoid while using secukinumab?  Do not receive a live vaccine while using secukinumab. Live vaccines include measles, mumps, rubella (MMR), polio, rotavirus, typhoid, yellow fever, varicella (chickenpox), zoster (shingles), and nasal flu (influenza) vaccine.  What are the possible side effects of secukinumab?  Get emergency medical help if you have signs of an allergic reaction: hives; chest tightness, difficult breathing; feeling like you might pass out; swelling of your face, lips, tongue, or throat.  Call your doctor at once if you have:  ?? redness, warmth, or painful sores on your skin;  ?? cough, shortness of breath, cough with red or pink mucus;  ?? increased urination, burning when you urinate;  ?? sores or white patches in your mouth or throat (yeast infection or thrush);  ?? diarrhea, stomach pain; or  ?? fever, chills, sweating, muscle pain, weight loss.  Common side effects may include:  ?? diarrhea; or  ?? cold symptoms such as stuffy nose, sneezing, sore throat.  This is not a complete list of side effects and others may occur. Call your doctor for medical advice about side effects. You may report side effects to FDA at 1-800-FDA-1088.  What other drugs will affect secukinumab?  Other drugs may affect secukinumab, including prescription and over-the-counter medicines, vitamins, and herbal products. Tell your doctor about all your current medicines and any medicine you start or stop using.  Where can I get more information?  Your doctor or pharmacist can provide more information about secukinumab.  Remember, keep this and all other medicines out of the reach of children, never share your medicines with others, and use this medication only for the indication prescribed.   Every effort has been made to ensure that the information provided by [employer] ('[employer]') is accurate, up-to-date, and complete, but no guarantee is made to that effect. Drug information contained herein may be time sensitive. [employer] information has been compiled for use by healthcare practitioners and consumers in the [place] and therefore [employer] does not warrant that uses outside of the [place] are appropriate, unless specifically indicated otherwise. [employer]'s drug information does not endorse drugs, diagnose patients or recommend therapy. [employer]'s drug information is an [employer] corporate to assist licensed healthcare practitioners in caring for their patients and/or to serve consumers viewing this service as a supplement to, and not a substitute for, the expertise, skill, knowledge and judgment of healthcare practitioners. The absence of a warning for a given drug or drug combination in no way should be construed to indicate that the drug or drug combination is safe, effective or appropriate for any given patient. Mult[employer] does not assume any responsibility for any aspect of healthcare administered with the aid of information [employer] provides. The information contained herein is not intended to cover all possible uses, directions, precautions, warnings, drug interactions, allergic reactions, or adverse effects. If you have questions about the drugs you are taking, check with your doctor, nurse or pharmacist.  Copyright [phone] [employer] Version: 4.01. Revision date: 01/31/2019.  Care instructions adapted under license by [facility]. If you have questions about a medical condition or this instruction, always ask your healthcare professional. [employer] disclaims any warranty or liability for your use of this information.

## 2019-08-03 LAB — QUANTIFERON MITOGEN: Lab: 9.92

## 2019-08-03 LAB — TB AG2 VALUE: Lab: 0.09

## 2019-08-03 LAB — QUANTIFERON TB GOLD PLUS
QUANTIFERON ANTIGEN 1 MINUS NIL: 0.01 [IU]/mL
QUANTIFERON ANTIGEN 2 MINUS NIL: 0.01 [IU]/mL
QUANTIFERON TB GOLD PLUS: NEGATIVE

## 2019-08-03 LAB — TB AG1 VALUE: Lab: 0.09

## 2019-08-03 LAB — TB NIL VALUE: Lab: 0.08

## 2019-08-03 LAB — TB MITOGEN VALUE: Lab: 10

## 2019-08-03 NOTE — Unmapped (Signed)
Mary Waters states there should be one more dose of her Cimzia on site at the rheumatology clinic and she is scheduled to receive that dose on 08/15/2019.  However after her dermatology visit yesterday plans are being discussed to potentially switch her to a different biologic agent.  I advised Mary Waters to reach out to her care team before traveling to the clinic for her dose to confirm the final plan after her dermatologist has consulted with her rheumatologist to ensure what has been decided for her treatment plan.     She asks that I reach out to her in 3-4 weeks to check in an determine if she would be needing a refill of her Cimzia at that point in time or not.    Rescheduling Mary Waters's next specialty pharmacy outreach call appropriately - the patient voiced understanding that they are to call us back at the [facility] if they need anything between now and then.

## 2019-08-04 NOTE — Unmapped (Signed)
Results relayed through MyChart. Reviewed that had brief discussion with her rheumatology team regarding switch from Cimzia to Cosentyx and they felt this was reasonable. Cosentyx to be prescribed through rheumatology. Asked that she please contact me with questions or concerns.

## 2019-08-06 MED ORDER — ALCOHOL SWABS
PRN refills | 0 days
Start: 2019-08-06 — End: ?

## 2019-08-06 MED ORDER — COSENTYX PEN 300 MG/2 PENS (150 MG/ML) SUBCUTANEOUS
SUBCUTANEOUS | 0 refills | 35.00000 days | Status: CP
Start: 2019-08-06 — End: 2019-09-21
  Filled 2019-08-17: qty 10, 35d supply, fill #0

## 2019-08-06 MED ORDER — RASUVO (PF) 15 MG/0.3 ML SUBCUTANEOUS AUTO-INJECTOR
SUBCUTANEOUS | 1 refills | 0 days | Status: CP
Start: 2019-08-06 — End: ?
  Filled 2019-08-17: qty 3.6, 84d supply, fill #0

## 2019-08-06 MED ORDER — COSENTYX PEN 300 MG/2 PENS (150 MG/ML) SUBCUTANEOUS: 300 mg | mL | 3 refills | 84 days | Status: AC

## 2019-08-06 NOTE — Unmapped (Signed)
-----   Message from Newt Lukes, MD sent at 08/02/2019 10:59 AM EDT -----  Regarding: switching biologics  Hi Mary Waters (and team),    I saw Mary Waters back in dermatology clinic this morning. She is currently on Cimzia and methotrexate through you guys for psoriasis/psoriatic arthritis. She can't seem to get control of her palmoplantar pustulosis with just clobetasol and some psoriasis plaques are starting to come back on her low back/buttocks. Would you be ok switching her to Cosentyx? I was thinking maybe Cosentyx replacing the Cimzia and keep the methotrexate for now but maybe eventually get off that if the IL-17 inhibitor can control it alone? I had her get the repeat quantiferon gold today and told her I would talk with you all. Please let me know what you think.    Thanks,  Jacobo Forest  Dermatology

## 2019-08-06 NOTE — Unmapped (Signed)
Called to discuss recommendation to change cimzia to cosentyx. Discussed risks of use of cosentyx. Pt verbalizes understanding and wishes to proceed with this change. Cosentyx 300 mg load followed by 300 mg maintenance dose sent to [facility] pharmacy.     She also notes that she still has trouble tolerating mtx pills due to nausea. She states that she is interested in doing mtx injections, but she can only give herself an injection if it is an autoinjector. Will see if we can get rasuvo mtx autoinjector for her. Sent Rx to [facility] as well.

## 2019-08-07 NOTE — Unmapped (Signed)
Per test claim for Cosentyx and Rasuvo at the [facility], patient needs Medication Assistance Program for Prior Authorization.

## 2019-08-15 NOTE — Unmapped (Signed)
[facility] Specialty Medication Referral: [place] Approved      Medication (Brand/Generic): RASUVO    Final Test Claim completed with resulted information below:    Patient ABLE to fill at [facility]  Insurance Company:  [employer] D  Anticipated Copay: $0 FOR 84 DAYS  Is anticipated copay with a copay card or grant? No, there is no need for grant or copay assistance.     Does this patient have to receive a partial fill of the medication due to insurance restrictions? NO  If so, please cofirm how many days supply is allowed per plan per fill and how long the patient will have to fill partial months supply for the medication: NOT APPLICABLE     If the copay is under the $25 defined limit, per policy there will be no further investigation of need for financial assistance at this time unless patient requests. This referral has been communicated to the provider and handed off to the [facility] team for further processing and filling of prescribed medication.   ______________________________________________________________________  Please utilize this referral for viewing purposes as it will serve as the central location for all relevant documentation and updates.

## 2019-08-15 NOTE — Unmapped (Signed)
[facility] Specialty Medication Referral: PA Approved      Medication (Brand/Generic): COSENTYX LOADING AND MAINTENANCE DOSES    Final Test Claim completed with resulted information below:    Patient ABLE to fill at [facility]  Insurance Company:  [employer] PART D  Anticipated Copay: $72 EACH  Is anticipated copay with a copay card or grant? No, there is no need for grant or copay assistance.     Does this patient have to receive a partial fill of the medication due to insurance restrictions? NO  If so, please cofirm how many days supply is allowed per plan per fill and how long the patient will have to fill partial months supply for the medication: NOT APPLICABLE     If the copay is under the $25 defined limit, per policy there will be no further investigation of need for financial assistance at this time unless patient requests. This referral has been communicated to the provider and handed off to the [facility] team for further processing and filling of prescribed medication.   ______________________________________________________________________  Please utilize this referral for viewing purposes as it will serve as the central location for all relevant documentation and updates.

## 2019-08-16 NOTE — Unmapped (Signed)
[facility]   Patient Onboarding/Medication Counseling    Mary Waters is a 53 y.o. female with psoriatic arthritis who I am counseling today on initiation of therapy.  I am speaking to the patient.    Verified patient's date of birth / HIPAA.    Specialty medication(s) to be sent: Inflammatory Disorders: Cosentyx and Rasuvo      Non-specialty medications/supplies to be sent: alcohol swabs       Cosentyx (secukinumab)    Medication & Administration     Dosage: Psoriatic arthritis with coexistent moderate to severe plaque psoriasis: Inject 300mg  under the skin at weeks 0, 1, 2, 3, and 4 followed by 300mg  every 4 weeks      Lab tests required prior to treatment initiation:  ? Tuberculosis: Tuberculosis screening resulted in a non-reactive Quantiferon TB Gold assay.      Administration:     Prefilled Sensoready?? auto-injector pen  1. Gather all supplies needed for injection on a clean, flat working surface: medication pen removed from packaging, alcohol swab, sharps container, etc.  2. Look at the medication label ??? look for correct medication, correct dose, and check the expiration date  3. Look at the medication ??? the liquid visible in the window on the side of the pen device should appear clear and colorless to slightly yellow  4. Lay the auto-injector pen on a flat surface and allow it to warm up to room temperature for at least 15-30 minutes  5. Select injection site ??? you can use the front of your thigh or your belly (but not the area 2 inches around your belly button); if someone else is giving you the injection you can also use your upper arm in the skin covering your triceps muscle  6. Prepare injection site ??? wash your hands and clean the skin at the injection site with an alcohol swab and let it air dry, do not touch the injection site again before the injection  7. Twist off the purple safety cap in the direction of the arrow, do not remove until immediately prior to injection and do not touch the yellow needle cover  8. Put the white needle cover against your skin at the injection site at a 90 degree angle, hold the pen such that you can see the clear medication window  9. Press down and hold the pen firmly against your skin, there will be a click when the injection starts  10. Continue to hold the pen firmly against your skin for about 10-15 seconds ??? the window will start to turn solid green  11. There will be a second click sound when the injection is almost complete, verify the window is solid green to indicate the injection is complete and then pull the pen away from your skin  12. Dispose of the used auto-injector pen immediately in your sharps disposal container the needle will be covered automatically  13. If you see any blood at the injection site, press a cotton ball or gauze on the site and maintain pressure until the bleeding stops, do not rub the injection site      Adherence/Missed dose instructions:  If your injection is given more than 4 days after your scheduled injection date ??? consult your pharmacist for additional instructions on how to adjust your dosing schedule.        Goals of Therapy     ? Achieve remission/inactive disease or low/minimal disease activity  ? Maintenance of function  ?  Minimization of systemic manifestations and comorbidities  ? Maintenance of effective psychosocial functioning      Side Effects & Monitoring Parameters     ? Injection site reaction (redness, irritation, inflammation localized to the site of administration)  ? Signs of a common cold ??? minor sore throat, runny or stuffy nose, etc.  ? Diarrhea    The following side effects should be reported to the provider:  ? Signs of a hypersensitivity reaction ??? rash; hives; itching; red, swollen, blistered, or peeling skin; wheezing; tightness in the chest or throat; difficulty breathing, swallowing, or talking; swelling of the mouth, face, lips, tongue, or throat; etc.  ? Reduced immune function ??? report signs of infection such as fever; chills; body aches; very bad sore throat; ear or sinus pain; cough; more sputum or change in color of sputum; pain with passing urine; wound that will not heal, etc.  Also at a slightly higher risk of some malignancies (mainly skin and blood cancers) due to this reduced immune function.  o In the case of signs of infection ??? the patient should hold the next dose of Cosentyx?? and call your primary care provider to ensure adequate medical care.  Treatment may be resumed when infection is treated and patient is asymptomatic.  ? Muscle pain or weakness  ? Shortness of breath      Warnings, Precautions, & Contraindications     ? Have your bloodwork checked as you have been told by your prescriber  ? Talk with your doctor if you are pregnant, planning to become pregnant, or breastfeeding  ? Discuss the possible need for holding your dose(s) of Cosentyx?? when a planned procedure is scheduled with the prescriber as it may delay healing/recovery timeline       Drug/Food Interactions     ? Medication list reviewed in Epic. The patient was instructed to inform the care team before taking any new medications or supplements. No drug interactions identified.   ? If you have a latex allergy use caution when handling, the needle cap of the Cosentyx?? prefilled syringe and the safety cap for the Cosentyx Sensoready?? pen contains a derivative of natural rubber latex  ? Talk with you prescriber or pharmacist before receiving any live vaccinations while taking this medication and after you stop taking it      Storage, Handling Precautions, & Disposal     ? Store this medication in the refrigerator.  Do not freeze  ? If needed, you may store at room temperature for up to 1 hour  ? Store in [employer], protected from light  ? Do not shake  ? Dispose of used syringes/pens in a Teacher, music           Rasuvo (methotrexate injection)    Medication & Administration     Dosage: Inject the contents of one auto-injector (15mg ) under the skin every 7 days    Administration:     Auto-injector pen  1. Gather all supplies needed for injection on a clean, flat working surface: medication pen, alcohol swabs, sharps container, etc.  2. Look at the medication label ??? look for correct medication, correct dose, and check the expiration date  3. Look at the medication ??? the liquid in the viewing window of the pen device should appear clear and yellow in color  4. Select injection site ??? you can use the front of your thigh or your belly (but not the area 2 inches around your belly button)  5. Prepare injection site ??? wash your hands and clean the skin at the injection site with an alcohol swab and let it air dry, do not touch the injection site again before the injection  6. Remove the yellow safety cap by pulling it straight off, do not attempt to twist it; the pen is now ready to use  7. Pinch up a pad of skin surrounding the cleaned injection site with your thumb and forefinger of your hand not holding the auto-injector pen  8. Position the uncapped transparent end of the auto-injector against your cleaned injection site at a 90 degree angle; push the pen firmly onto your skin until you feel the stop point in order to unlock the yellow injection button  9. While holding the pen firmly in place, depress the yellow injection button with your thumb to initiate the injection process; you will hear a click when the process begins; continue to hold the pen firmly in place for at least 5 seconds; visualize the transparent control zone of the pen to make sure the entire dose is injection  10. When the movement stopped the injection is completed and you can pull the pen away from your injection site  11. Dispose of the used auto-injector immediately in your sharps disposal container  12. If you see any blood at the injection site, press a cotton ball or gauze on the site and maintain pressure until the bleeding stops, do not rub the injection site      Adherence/Missed dose instructions:  If your injection is given more than 2 days after your scheduled injection date ??? consult your pharmacist for additional instructions on how to adjust your dosing schedule.    Goals of Therapy     ? Achieve symptom remission  ? Slow disease progression  ? Protection of remaining articular structures  ? Maintenance of function  ? Maintenance of effective psychosocial functioning    Side Effects & Monitoring Parameters     ? Upset stomach, diarrhea, nausea or throwing up  ? Feeling dizzy, tired, or weak  ? Hair thinning (reversible)  ? Minor cold-like symptoms  ? Photosensitivity ??? use sunscreen and avoid prolonged exposure    The following side effects should be reported to the provider:  ? Signs of a hypersensitivity reaction ??? rash; hives; itching; red, swollen, blistered, or peeling skin; wheezing; tightness in the chest or throat; difficulty breathing, swallowing, or talking; swelling of the mouth, face, lips, tongue, or throat; etc.  ? Reduced immune function ??? report signs of infection such as fever; chills; body aches; very bad sore throat; ear or sinus pain; cough; more sputum or change in color of sputum; pain with passing urine; wound that will not heal, etc.  Also at a slightly higher risk of some malignancies (mainly skin and blood cancers) due to this reduced immune function.  o In the case of signs of infection ??? the patient should hold the next dose of Rasuvo?? and call your primary care provider to ensure adequate medical care.  Treatment may be resumed when infection is treated and patient is asymptomatic.  ? Signs of bleeding ??? throwing up or coughing up blood; blood in the urine; black, red, or tarry stool; abnormal vaginal bleeding; bruises without a cause or that get bigger  ? Signs of pancreatitis ??? sudden very bad stomach pain, unexplained vomiting  ? Signs of kidney dysfunction ??? unable to pass urine; blood in the urine; sudden weight gain  ?  Signs of liver dysfunction ??? darkened urine; upset stomach; light-colored stool; yellow skin or eyes  ? Signs of nerve problems ??? burning; numbness; tingling  ? Pinpoint red spots on the skin  ? Changes in eyesight      Contraindications, Warnings, & Precautions     ? Have your bloodwork checked as you have been told by your prescriber  ? Talk with your doctor if you are pregnant, planning to become pregnant, or breastfeeding ??? women taking this medication must use birth control while taking this drug and for some time after the last dose  ? Discuss the possible need for holding your dose(s) of methotrexate when a planned procedure is scheduled with the prescriber as it may delay healing/recovery timeline       Drug/Food Interactions     ? Medication list reviewed in Epic. The patient was instructed to inform the care team before taking any new medications or supplements. No drug interactions identified.     Storage, Handling Precautions, & Disposal     ? Store intact pens at room temperature  ? Protect from light  ? Dispose of used pens in a sharps disposal container        Current Medications (including OTC/herbals), Comorbidities and Allergies     Current Outpatient Medications   Medication Sig Dispense Refill   ??? acetaminophen (TYLENOL) 500 MG tablet Take 1,000 mg by mouth.     ??? baclofen (LIORESAL) 10 MG tablet TAKE 1 TABLET BY MOUTH THREE TIMES DAILY AS NEEDED FOR MUSCLE SPASMS 270 tablet 3   ??? clindamycin (CLEOCIN T) 1 % lotion Apply topically Two (2) times a day to affected bumps until resolved 60 mL 5   ??? clobetasoL (TEMOVATE) 0.05 % ointment Apply topically Two (2) times a day to palms 60 g 1   ??? cyclobenzaprine (FLEXERIL) 10 MG tablet Take 10 mg by mouth once as needed for muscle spasms. Takes only with severe symptoms, usually taking baclofen.     ??? escitalopram oxalate (LEXAPRO) 10 MG tablet Take 1 tablet by mouth daily 90 tablet 3   ??? gabapentin (NEURONTIN) 300 MG capsule TAKE 1 CAPSULE (300 MG TOTAL) BY MOUTH NIGHTLY. 90 capsule 3   ??? leucovorin (WELLCOVORIN) 5 mg tablet Take 3 tablets (15 mg total) by mouth every seven (7) days. Take 12-24 hours after methotrexate. 36 tablet 3   ??? loratadine (CLARITIN) 10 mg tablet Take 1 tablet (10 mg total) by mouth once as needed for allergies. 90 tablet 3   ??? menthol/camphor (CAMPHOR-MENTHOL) 4-10 % Crea Apply topically.     ??? methotrexate, PF, (RASUVO, PF,) 15 mg/0.3 mL AtIn Inject the contents of 1 syringe (15 mg) under the skin every seven (7) days. 3.6 mL 1   ??? phenylephrine (SUDAFED PE) 10 MG Tab Take 10 mg by mouth every four (4) hours as needed.     ??? phosphorated carbohydrate (EMETROL) solution Take 15 mL by mouth once as needed for nausea.     ??? secukinumab (COSENTYX PEN) 150 mg/mL PnIj injection Inject the contents of 2 pens (300 mg total) under the skin once a week for 5 weeks 15 mL 0   ??? secukinumab (COSENTYX PEN, 2 PENS,) 150 mg/mL PnIj injection Inject the contents of 2 pens (300 mg) under the skin every twenty-eight (28) days. 6 mL 3     No current facility-administered medications for this visit.        Allergies   Allergen Reactions   ???  Prednisone Hives   ??? Sulfa (Sulfonamide Antibiotics) Hives     Other reaction(s): HIVES  Other reaction(s): HIVES   ??? Sulfasalazine      Other reaction(s): HIVES   ??? Enbrel [Etanercept] Rash       Patient Active Problem List   Diagnosis   ??? Health care maintenance   ??? Allergic rhinitis   ??? Fibromyalgia   ??? Carpal tunnel syndrome of right wrist   ??? GERD (gastroesophageal reflux disease)   ??? Chronic pain syndrome   ??? Morbid obesity due to excess calories (CMS-HCC)   ??? Psoriasis   ??? IBS (irritable bowel syndrome)   ??? History of tobacco use   ??? Menopause   ??? Psoriatic arthritis (CMS-HCC)   ??? Cataracts, both eyes   ??? Cervical myelopathy (CMS-HCC)       Reviewed and up to date in Epic.     Appropriateness of Therapy     Is medication and dose appropriate based on diagnosis? Yes    Baseline Quality of Life Assessment      How many days over the past month did your psoriatic arthritis keep you from your normal activities? 0    Financial Information     Medication Assistance provided: Prior Authorization    Anticipated copay of $0.00 reviewed with patient. Verified delivery address.    Delivery Information     Scheduled delivery date: 08/17/2019    Expected start date: 08/17/2019    Medication will be delivered via Same Day Courier to the home address in [place].  This shipment will not require a signature.      Explained the services we provide at [facility] and that each month we would call to set up refills.  Stressed importance of returning phone calls so that we could ensure they receive their medications in time each month.  Informed patient that we should be setting up refills 7-10 days prior to when they will run out of medication.  A pharmacist will reach out to perform a clinical assessment periodically.  Informed patient that a welcome packet and a drug information handout will be sent.      Patient verbalized understanding of the above information as well as how to contact the pharmacy at [phone] option 4 with any questions/concerns.  The pharmacy is open Monday through Friday 8:30am-4:30pm.  A pharmacist is available 24/7 via pager to answer any clinical questions they may have.    Patient Specific Needs     ? Does the patient have any physical, cognitive, or cultural barriers? No    ? Patient prefers to have medications discussed with  Patient     ? Is the patient able to read and understand education materials at a high school level or above? Yes    ? Patient's primary language is  English     ? Is the patient high risk? No     ? Does the patient require a Care Management Plan? No     ? Does the patient require physician intervention or other additional services (i.e. nutrition, smoking cessation, social work)? No      Karene Fry Lakishia Bourassa  [facility] Specialty Pharmacist

## 2019-08-17 MED FILL — COSENTYX PEN 300 MG/2 PENS (150 MG/ML) SUBCUTANEOUS: 35 days supply | Qty: 10 | Fill #0 | Status: AC

## 2019-08-17 MED FILL — ALCOHOL SWABS: 100 days supply | Qty: 100 | Fill #0

## 2019-08-17 MED FILL — RASUVO (PF) 15 MG/0.3 ML SUBCUTANEOUS AUTO-INJECTOR: 84 days supply | Qty: 4 | Fill #0 | Status: AC

## 2019-08-17 MED FILL — ALCOHOL SWABS: 100 days supply | Qty: 100 | Fill #0 | Status: AC

## 2019-10-01 MED ORDER — GABAPENTIN 300 MG CAPSULE: capsule | 3 refills | 0 days | Status: AC

## 2019-10-03 ENCOUNTER — Ambulatory Visit: Admit: 2019-10-03 | Discharge: 2019-10-03 | Payer: MEDICARE

## 2019-10-03 DIAGNOSIS — L4050 Arthropathic psoriasis, unspecified: Principal | ICD-10-CM

## 2019-10-03 DIAGNOSIS — Z23 Encounter for immunization: Principal | ICD-10-CM

## 2019-10-03 DIAGNOSIS — Z79899 Other long term (current) drug therapy: Principal | ICD-10-CM

## 2019-10-03 LAB — ALT (SGPT): Alanine aminotransferase:CCnc:Pt:Ser/Plas:Qn:: 20

## 2019-10-03 LAB — CBC W/ AUTO DIFF
BASOPHILS ABSOLUTE COUNT: 0 10*9/L (ref 0.0–0.1)
EOSINOPHILS ABSOLUTE COUNT: 0.2 10*9/L (ref 0.0–0.4)
EOSINOPHILS RELATIVE PERCENT: 2.7 %
HEMATOCRIT: 39.3 % (ref 36.0–46.0)
LARGE UNSTAINED CELLS: 3 % (ref 0–4)
LYMPHOCYTES RELATIVE PERCENT: 45 %
MEAN CORPUSCULAR HEMOGLOBIN CONC: 31.2 g/dL (ref 31.0–37.0)
MEAN CORPUSCULAR HEMOGLOBIN: 29.7 pg (ref 26.0–34.0)
MEAN CORPUSCULAR VOLUME: 95.2 fL (ref 80.0–100.0)
MEAN PLATELET VOLUME: 7.1 fL (ref 7.0–10.0)
MONOCYTES ABSOLUTE COUNT: 0.4 10*9/L (ref 0.2–0.8)
MONOCYTES RELATIVE PERCENT: 6.7 %
NEUTROPHILS ABSOLUTE COUNT: 2.3 10*9/L (ref 2.0–7.5)
NEUTROPHILS RELATIVE PERCENT: 42.1 %
PLATELET COUNT: 311 10*9/L (ref 150–440)
RED CELL DISTRIBUTION WIDTH: 13.9 % (ref 12.0–15.0)
WBC ADJUSTED: 5.5 10*9/L (ref 4.5–11.0)

## 2019-10-03 LAB — SMEAR REVIEW

## 2019-10-03 LAB — CREATININE
CREATININE: 0.82 mg/dL (ref 0.60–1.00)
Creatinine:MCnc:Pt:Ser/Plas:Qn:: 0.82
EGFR CKD-EPI NON-AA FEMALE: 83 mL/min/{1.73_m2} (ref >=60)

## 2019-10-03 LAB — C-REACTIVE PROTEIN: C reactive protein:MCnc:Pt:Ser/Plas:Qn:: 34.5 — ABNORMAL HIGH

## 2019-10-03 LAB — AST (SGOT): Aspartate aminotransferase:CCnc:Pt:Ser/Plas:Qn:: 32

## 2019-10-03 LAB — LARGE UNSTAINED CELLS: Lab: 3

## 2019-10-03 LAB — ERYTHROCYTE SEDIMENTATION RATE: Lab: 65 — ABNORMAL HIGH

## 2019-10-03 MED FILL — LORATADINE 10 MG TABLET: ORAL | 90 days supply | Qty: 90 | Fill #1

## 2019-10-03 MED FILL — GABAPENTIN 300 MG CAPSULE: 90 days supply | Qty: 90 | Fill #0

## 2019-10-03 MED FILL — LEUCOVORIN CALCIUM 5 MG TABLET: ORAL | 84 days supply | Qty: 36 | Fill #2

## 2019-10-03 MED FILL — CLOBETASOL 0.05 % TOPICAL OINTMENT: TOPICAL | 30 days supply | Qty: 60 | Fill #1

## 2019-10-03 MED FILL — LORATADINE 10 MG TABLET: 90 days supply | Qty: 90 | Fill #1 | Status: AC

## 2019-10-03 MED FILL — ESCITALOPRAM 10 MG TABLET: ORAL | 90 days supply | Qty: 90 | Fill #1

## 2019-10-03 MED FILL — ESCITALOPRAM 10 MG TABLET: 90 days supply | Qty: 90 | Fill #1 | Status: AC

## 2019-10-03 MED FILL — BACLOFEN 10 MG TABLET: ORAL | 90 days supply | Qty: 270 | Fill #1

## 2019-10-03 MED FILL — BACLOFEN 10 MG TABLET: 90 days supply | Qty: 270 | Fill #1 | Status: AC

## 2019-10-03 MED FILL — GABAPENTIN 300 MG CAPSULE: 90 days supply | Qty: 90 | Fill #0 | Status: AC

## 2019-10-03 MED FILL — CLOBETASOL 0.05 % TOPICAL OINTMENT: 30 days supply | Qty: 60 | Fill #1 | Status: AC

## 2019-10-03 MED FILL — LEUCOVORIN CALCIUM 5 MG TABLET: 84 days supply | Qty: 36 | Fill #2 | Status: AC

## 2019-10-03 NOTE — Unmapped (Signed)
cimPatient Name: Mary Waters  PCP: Noralyn Pick, FNP      Chief Compliant: Psoriatic arthritis follow-up    HPI: Mary Waters is a 53 y.o. AA female with a history of psoriatic arthritis, fibromyalgia, and cervical myelopathy.      Rheum Hx:  Established care in September 2016.  Ultrasound in 2016 showing small PIP joint effusions, presence of enthesophytes, and achilles tendon inflammation/tendinitis consistent with an underlying spondyloarthropathy such as psoriatic arthritis.    Humira 2016-06/2016 with loss of efficacy  Methotrexate added 01/2016 for persistent peripheral arthritis; switched to auto-injector 07/2019  Enbrel 06/2016-09/2017 with loss of efficacy  Cimzia 09/2017 to 07/2019.   Cosentyx  07/2019 -     Additional hx of cervical myelopathy for which surgery has been recommended, but she is not planning to pursue this.   She also has clinical evidence of fibromyalgia which is being treated with gabapentin.     Interim history:    Per telephone enconter on 08/06/19, patient changed to Cosentyx from Cimzia per Derm recommendations for returning psoriatic plaques on her low back/buttocks.      Today: Confirmed the above interim history and adds that she is now taking methotrexate auto injector 15 mg. Reports less nausea and stomach upset since switching to auto-injector. With regards to Cosentyx, she does not notice an improvement in her plaque psoriasis and continues to see new lesions on her feet. Continues to endorse plaque rupture over her buttocks and has been using clobetazole cream.  Believes plaques over her buttocks may be improving with cream as the color is no longer violaceous and is ligtening to a light brought. With respect to arthralgias continues to have pain in her hands and feet, and now mid back that radiates anteriorly. 30 mintues of stiffness in joints in the mornings. Baclofen helps some. Will use flexoril if pain is really bad and towards the end of the day as she doesn't have to be weary of sleeping. Of note, does report she is sleeping better due to improved sleep hygiene.    Reports she'll feel feverish, but also reports it may be hot flashes. Does have dry eye and takes eye lubricant. Denies dry mouth but does notice chapped lips. Denies difficultly breathing and chest pain.       ROS??:Attests to the above, otherwise, review of ten system is negative.??    Past Medical and Surgical History:  ??  Patient Active Problem List    Diagnosis Date Noted   ??? Cervical myelopathy (CMS-HCC) 12/09/2016   ??? Psoriatic arthritis (CMS-HCC) 01/07/2016   ??? Menopause 08/07/2015   ??? History of tobacco use 10/23/2014   ??? Fibromyalgia 04/01/2014   ??? Carpal tunnel syndrome of right wrist 04/01/2014   ??? GERD (gastroesophageal reflux disease) 04/01/2014   ??? Chronic pain syndrome 04/01/2014   ??? Morbid obesity due to excess calories (CMS-HCC) 04/01/2014   ??? Psoriasis 04/01/2014   ??? Allergic rhinitis 02/04/2014   ??? Cataracts, both eyes 10/03/2013   ??? Health care maintenance 12/23/2010   ??? IBS (irritable bowel syndrome) 12/20/2008     Past Surgical History:   Procedure Laterality Date   ??? CESAREAN SECTION ??? DILATION AND CURETTAGE OF UTERUS     ??? HYSTERECTOMY  2001    TAH   ??? KNEE SURGERY     ??? TOTAL ABDOMINAL HYSTERECTOMY       Allergies:   ??  Allergies   Allergen Reactions   ??? Prednisone Hives   ???  Sulfa (Sulfonamide Antibiotics) Hives     Other reaction(s): HIVES  Other reaction(s): HIVES   ??? Sulfasalazine      Other reaction(s): HIVES   ??? Enbrel [Etanercept] Rash     Current Outpatient Medications:  ??  Current Outpatient Medications on File Prior to Visit   Medication Sig Dispense Refill   ??? acetaminophen (TYLENOL) 500 MG tablet Take 1,000 mg by mouth.     ??? alcohol swabs (ALCOHOL PADS) PadM Use as directed to prepare injection sites 100 each PRN   ??? baclofen (LIORESAL) 10 MG tablet TAKE 1 TABLET BY MOUTH THREE TIMES DAILY AS NEEDED FOR MUSCLE SPASMS 270 tablet 3   ??? clindamycin (CLEOCIN T) 1 % lotion Apply topically Two (2) times a day to affected bumps until resolved 60 mL 5   ??? clobetasoL (TEMOVATE) 0.05 % ointment Apply topically Two (2) times a day to palms 60 g 1   ??? cyclobenzaprine (FLEXERIL) 10 MG tablet Take 10 mg by mouth once as needed for muscle spasms. Takes only with severe symptoms, usually taking baclofen.     ??? escitalopram oxalate (LEXAPRO) 10 MG tablet Take 1 tablet by mouth daily 90 tablet 3   ??? gabapentin (NEURONTIN) 300 MG capsule TAKE 1 CAPSULE (300 MG TOTAL) BY MOUTH NIGHTLY. 90 capsule 3   ??? leucovorin (WELLCOVORIN) 5 mg tablet Take 3 tablets (15 mg total) by mouth every seven (7) days. Take 12-24 hours after methotrexate. 36 tablet 3   ??? loratadine (CLARITIN) 10 mg tablet Take 1 tablet (10 mg total) by mouth once as needed for allergies. 90 tablet 3   ??? menthol/camphor (CAMPHOR-MENTHOL) 4-10 % Crea Apply topically.     ??? methotrexate, PF, (RASUVO, PF,) 15 mg/0.3 mL AtIn Inject the contents of 1 syringe (15 mg) under the skin every 7 days 3.6 mL 1   ??? phenylephrine (SUDAFED PE) 10 MG Tab Take 10 mg by mouth every four (4) hours as needed. ??? phosphorated carbohydrate (EMETROL) solution Take 15 mL by mouth once as needed for nausea.     ??? secukinumab (COSENTYX PEN, 2 PENS,) 150 mg/mL PnIj injection Inject the contents of 2 pens (300 mg) under the skin every twenty-eight (28) days. 6 mL 3   ??? [EXPIRED] secukinumab (COSENTYX PEN, 2 PENS,) 150 mg/mL PnIj injection Inject 2 mL (300 mg total) under the skin once a week. 10 mL 0     No current facility-administered medications on file prior to visit.      PHYSICAL EXAM??  Vitals:    10/03/19 1226   BP: 138/79   BP Site: L Arm   BP Position: Sitting   BP Cuff Size: Large   Pulse: 94   Temp: 36.2 ??C   TempSrc: Temporal   Weight: (!) 114.3 kg (252 lb)   Height: 172.7 cm (5' 8)        General:   Pleasant 53 y.o.female in no acute distress, WDWN   Eyes:   PERRL, conjunctiva and sclera not inflamed. Tears appear adequate.    ENT:   No oropharyngeal lesions. Mucous membranes moist.    Lymph:   No masses or cervical lymphadenopathy.    Cardiovascular:  Regular rate and rhythm. No murmur, rub, or gallop. No lower extremity edema.    Lungs:  Clear to auscultation.Normal respiratory effort.    Musculoskeletal:   General: Ambulates w/o assistance   Hands: Tenderness to all PIPs. Slight stiffness in grip bilaterally with R> L, some R MCP fullness,  Grip Strength   - R: 30 cm   - L:  50 cm  Wrists: Reduced ROM b/l w/o pain  Elbows: Flexion contracture of the left  Shoulders: Full range of motion bilaterally without significant pain   Spine: Pain in anterior right hip and lateral left hip with Faber maneuver.  Occipital wall 0 cm.  Modified Schober with 4 cm difference.  Tenderness over midline thoracic spine. Chest wall expansion: 6 cm   Hips: Full flexion  Knees: Range motion without effusions.  Crepitus bilaterally.  Ankles: No swelling or tenderness.  No swelling over Achilles tendon insertion.  Feet:  Slight pain with MTP squeeze in L foot   Neurological:  CN 2-12 grossly intact. 5/5 strength on extremities. Psych:  Appropriate affect and mood   Skin:   No hyperpigmentation, flaking, or scaling around hair-line. Improving plaques over R buttocks, Likely new eruptions along bilateral thenar prominence.            IMAGING??   EXAM: HAND, TWO VIEWS, BILATERAL  DATE: 01/21/15 11:53:03  ACCESSION: [id number] UN  DICTATED: 01/21/15 14:42:11  COMPARISON: None.  TECHNIQUE: PA and N[place]aard views of the hands.  FINDINGS: No fracture or dislocation in either hand.  There is diffuse bilateral periarticular osteopenia is within the hands. Soft tissues are normal. No joint space narrowing, osteophytosis or chondrocalcinosis. No erosions are seen.  Small calcific fragment projects at the radial aspect of the right third finger DIP joint, possibly sequelae of prior trauma.  IMPRESSION: Bilateral, diffuse periarticular osteopenia of the hands. No evidence for erosive disease.    [id number] UN 09/18/15 ??11:43:23 [id number] [facility]) : XR FOOT 3 OR MORE VIEWS BILATERAL  EXAM: FOOT COMPLETE BILATERAL  Dorsal-plantar, oblique and lateral nonweightbearing views of the feet are presented for interpretation 09/18/15. ??No prior studies available for comparison.  No fracture is seen. Jointline is normal. Joint spaces are normal. No erosions are seen. Extensive enthesophytes are noted involving the calcaneus base of the fifth metatarsal and the first metatarsal sesamoids bilaterally. Heterotopic bone is noted in the dorsal talonavicular joints bilaterally; given calcification of the dorsal right talonavicular joint capsule these findings are likely posttraumatic. Soft tissues are normal.  IMPRESSION: Extensive bilateral foot enthesopathic changes which can be seen with the seronegative spondyloarthropathies such as psoriatic and reactive arthritis.    ??GENERAL SUMMARY/IMPRESSION AND RECOMMENDATIONS: ??  1. Psoriatic arthritis (CMS-HCC) Some concern for persistent peripheral arthritis. She is now taking auto injector formulation of methotrexate 15 mg with improved tolerance - no longer endorsing nausea. Due to concern for progressive plaque psoriasis, was switched to Cosentyx in August, 2020 from [place]. Will continued Cosentyx q28 days. I think patient still needs more time on the Cosentyx to eval for improvement on this new therapy. Due to concern for persisent peripheral arthrititis, and as patient has gone some years without recent imaging, will order the following:  - ESR, CRP  - MRI pelvis spine to evaluate for disease activity in the SI joints  - XR pelvis to evaluate for disease activity in the SI joints  - Referral to Rheum [place] clinic to evaluate hands and feet bilaterally for inflammation as we were able to appreciated decreased grip strength and R > L MCP fullness     2. Methotrexate, long term, current use  Order for monitoring labs in place   - Albumin; Future  - AST; Future  - ALT; Future  - CBC w/ Differential; Future  - Creatinine; Future    3. Fibromyalgia  Symptomatic  but stable. Has been sleeping better since improvement in sleep hygiene       HCM:   -PCV13 Status:10/23/15  -PPSV 23 Status:03/04/16  -Annual Influenza vaccine. Status: 09/2019 with [place] today  - Bone health: not on prednisone, checking vit D level today    - Contraception: s/p hysterectomy      >25 min spent in consultation with pt, >50% of which was spent discussing diagnosis and treatment options.     Patient was seen by and discussed with Dr. Scarlette Calico.

## 2019-10-05 LAB — VITAMIN D, TOTAL (25OH): Lab: 18.6 — ABNORMAL LOW

## 2019-10-08 DIAGNOSIS — E559 Vitamin D deficiency, unspecified: Principal | ICD-10-CM

## 2019-10-08 MED ORDER — ERGOCALCIFEROL (VITAMIN D2) 1,250 MCG (50,000 UNIT) CAPSULE: 50000 [IU] | capsule | 2 refills | 28 days | Status: AC

## 2019-10-09 DIAGNOSIS — E559 Vitamin D deficiency, unspecified: Principal | ICD-10-CM

## 2019-10-09 MED ORDER — ERGOCALCIFEROL (VITAMIN D2) 1,250 MCG (50,000 UNIT) CAPSULE: 50000 [IU] | capsule | 2 refills | 28 days | Status: AC

## 2019-10-10 MED FILL — ERGOCALCIFEROL (VITAMIN D2) 1,250 MCG (50,000 UNIT) CAPSULE: 28 days supply | Qty: 4 | Fill #0 | Status: AC

## 2019-10-10 MED FILL — ERGOCALCIFEROL (VITAMIN D2) 1,250 MCG (50,000 UNIT) CAPSULE: ORAL | 28 days supply | Qty: 4 | Fill #0

## 2019-10-19 ENCOUNTER — Ambulatory Visit: Admit: 2019-10-19 | Discharge: 2019-10-20 | Payer: MEDICARE | Attending: Internal Medicine | Primary: Internal Medicine

## 2019-10-19 DIAGNOSIS — L403 Pustulosis palmaris et plantaris: Principal | ICD-10-CM

## 2019-10-19 DIAGNOSIS — L409 Psoriasis, unspecified: Principal | ICD-10-CM

## 2019-10-19 DIAGNOSIS — L0292 Furuncle, unspecified: Principal | ICD-10-CM

## 2019-10-19 MED ORDER — CLINDAMYCIN 1 % LOTION
Freq: Two times a day (BID) | TOPICAL | 5 refills | 0.00000 days | Status: CP
Start: 2019-10-19 — End: ?
  Filled 2019-10-22: qty 60, 30d supply, fill #0

## 2019-10-19 NOTE — Unmapped (Addendum)
Patient Education   Water engineer and feet at night     Psoriasis: Care Instructions  Your Care Instructions  Psoriasis (say suh-RY-uh-sus) is a long-term skin problem that causes thick, white, silvery, or red patches on the skin. The patches may be small or large, and they occur most often on the knees, elbows, scalp, hands, feet, or lower back.  The skin may be scaly. If the condition is severe, your skin can become itchy and tender. Psoriasis also can be embarrassing if the patches are on visible areas.  You can treat psoriasis with good care at home and with medicine from your doctor. You may put medicine on your skin and take pills or have shots to stop the redness and swelling. Your doctor also may suggest ultraviolet light treatments.  Follow-up care is a key part of your treatment and safety. Be sure to make and go to all appointments, and call your doctor if you are having problems. It's also a good idea to know your test results and keep a list of the medicines you take.  How can you care for yourself at home?  ?? If your doctor prescribes medicine, use it exactly as prescribed. Follow your doctor's advice for sunlight or ultraviolet light treatment. Call your doctor if you think you are having a problem with your medicine.  ?? Protect your skin:  ? Keep your skin moist. After bathing, put an ointment, cream, or lotion on your skin while it is still damp. This seals in moisture. Use over-the-counter products that your doctor suggests. These may include Cetaphil, Lubriderm, or Eucerin. Petroleum jelly (such as Vaseline) and vegetable shortening (such as Crisco) also work.  ? If you have psoriasis on your scalp, use a shampoo with salicylic acid, such as Neutrogena T/Sal.  ? Avoid harsh skin products, such as those that contain alcohol.  ? Cover your skin in cold weather.  ? Try to prevent sunburn. Although short periods of sun exposure reduce psoriasis in most people, too much sun can damage the skin and cause skin cancer. In addition, sunburns can trigger psoriasis. Use sunscreen on areas of your skin that do not have psoriasis. Make sure to use a broad-spectrum sunscreen that has a sun protection factor (SPF) of 30 or higher. Use it every day, even when it is cloudy.  ? Take care to avoid accidents such as cutting or scraping your skin. An injury to the skin can cause psoriasis patches to form anywhere on the body, including the area of the injury.  ? Avoid tight shoes, clothing, watchbands, and hats. These may irritate your skin.  ? Use a vaporizer or humidifier to add moisture to your bedroom. Follow the directions for cleaning the machine.  ?? Try making one or more changes to your daily habits to help with managing your psoriasis. For example:  ? Try to control stress and anxiety. They may cause psoriasis to appear suddenly or can make symptoms worse.  ? If you smoke, think about quitting. If you need help quitting, talk to your doctor about stop-smoking programs and medicines.  ? If you drink, limit or reduce the amount of alcohol you drink.  ? If you are overweight, see if you can lose some weight.  ?? Seek support from family and friends. Talk to a counselor or other professional if you feel sad about your condition and need more help.  When should you call for help?   Call your doctor now or seek  immediate medical care if:  ?? ?? You have signs of infection, such as:  ? Increased pain, swelling, warmth, or redness.  ? Red streaks leading from the area.  ? Pus draining from the area.  ? A fever.   Watch closely for changes in your health, and be sure to contact your doctor if:  ?? ?? You have swelling, stiffness, or pain in your joints.   ?? ?? You do not get better as expected.   Where can you learn more?  Go to [facility] at [email or web address]  Select Patient Education under [employer]. Enter [phone] in the search box to learn more about Psoriasis: Care Instructions.  Current as of: June 21, 2019??????????????????????????????Content Version: 12.6  ?? 2006-2020 [employer].   Care instructions adapted under license by [facility]. If you have questions about a medical condition or this instruction, always ask your healthcare professional. [employer], Inc[employer] disclaims any warranty or liability for your use of this information.         Patient Education        Hidradenitis Suppurativa: Care Instructions  Your Care Instructions     Hidradenitis suppurativa (say hih-drad-uh-NY-tus sup-yur-uh-TY-vuh) is a skin condition that causes lumps on the skin that look like pimples or boils. The lumps are usually painful and can break open and drain blood and bad-smelling pus. The condition can come and go for many years.  Treatment for this condition may include antibiotics and other medicines. You may need surgery to remove the lumps. Home care includes wearing loose-fitting clothes and washing the area gently. You can help prevent lumps from coming back by staying at a healthy weight and not smoking.  Doctors don't know exactly how this condition starts. But they do know that something irritates and inflames the hair follicles, causing them to swell and form lumps. This skin condition can't be spread from person to person (isn't contagious).  Follow-up care is a key part of your treatment and safety. Be sure to make and go to all appointments, and call your doctor if you are having problems. It's also a good idea to know your test results and keep a list of the medicines you take.  How can you care for yourself at home?  Skin care  ?? ?? Wash the area every day with mild soap. Use your hands rather than a washcloth or sponge when you wash that part of your body.   ?? ?? Leave the affected areas uncovered when you can. If you have lumps that are draining, you can cover them with a bandage or other dressing. Put petroleum jelly (such as Vaseline) on the dressing to help keep it from sticking.   ?? ?? Wear-loose fitting clothes that don't rub against the area. Avoid activities that cause skin to rub together.   ?? ?? If you have pain, try a warm compress. Soak a towel or washcloth in warm water, wring it out, and place it on the affected skin for about 10 minutes.   Medicines  ?? ?? Be safe with medicines. Take your medicines exactly as prescribed. Call your doctor if you think you are having a problem with your medicine. You will get more details on the specific medicines your doctor prescribes.   ?? ?? If your doctor prescribed antibiotics, take them as directed. Do not stop taking them just because you feel better. You need to take the full course of antibiotics.   Lifestyle choices  ?? ?? If you  smoke, think about quitting. Smoking can make the condition worse. If you need help quitting, talk to your doctor about stop-smoking programs and medicines. These can increase your chances of quitting for good.   ?? ?? Stay at a healthy weight, or lose weight, by eating healthy foods and being physically active. Being overweight could make this condition worse.   When should you call for help?   Call your doctor now or seek immediate medical care if:  ?? ?? You have symptoms of infection, such as:  ? Increased pain, swelling, warmth, or redness.  ? Red streaks leading from the area.  ? Pus draining from the area.  ? A fever.   Watch closely for changes in your health, and be sure to contact your doctor if:  ?? ?? You do not get better as expected.   Where can you learn more?  Go to [facility] at [email or web address]  Select Patient Education under [employer]. Enter [phone] in the search box to learn more about Hidradenitis Suppurativa: Care Instructions.  Current as of: June 21, 2019??????????????????????????????Content Version: 12.6  ?? 2006-2020 [employer]wise[employer].   Care instructions adapted under license by [facility]. If you have questions about a medical condition or this instruction, always ask your healthcare professional. Healthwise, [employer] disclaims any warranty or liability for your use of this information.

## 2019-10-19 NOTE — Unmapped (Signed)
Assessment and Plan:    1. 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   Recently completed cosentyx loading dose. Cnotinues on MTX  Mild involvement of palms and soles  Discussed thick layering of clobetasol under glove occlusion  - continue BID clobetasol    2 Furunculosis vs mild HS  - clindamycin lotion AAA BIDprn    RTC : 6 mo or sooner prn    Chief Complaint: follow-up palmoplantar pustulosis    HPI: Mary Waters is a pleasant 53 y.o. female with a history of psoriasis and psoriatic arthritis who was last seen in our clinic on 08/01/2028 by Dr Domenic Schwab. At that time, suboptimal control of sx on cimzia, methotrexate, and clobetasol. Decision made to change therapy from cimzia to cosentyx. Started cosentyx late August. Reports overall hands doing well. New spots similar to her hands - pustules on her feet.     Also notes new problem of boils in groin over past 3-4 months. Previously w these in axillae as well. Not using any Rx currently though clindamycin has helped w similar areas in past       No other new, changing, or symptomatic lesions of concern are reported today.    Pertinent Past Medical History:    Psoriasis, psoriatic arthritis -- previously tried Humira, Enbrel, now on Cimzia and methotrexate since October 2018    Medications:    Current Outpatient Medications on File Prior to Visit   Medication Sig Dispense Refill   ??? acetaminophen (TYLENOL) 500 MG tablet Take 1,000 mg by mouth.     ??? alcohol swabs (ALCOHOL PADS) PadM Use as directed to prepare injection sites 100 each PRN   ??? baclofen (LIORESAL) 10 MG tablet TAKE 1 TABLET BY MOUTH THREE TIMES DAILY AS NEEDED FOR MUSCLE SPASMS 270 tablet 3   ??? clindamycin (CLEOCIN T) 1 % lotion Apply topically Two (2) times a day to affected bumps until resolved 60 mL 5   ??? clobetasoL (TEMOVATE) 0.05 % ointment Apply topically Two (2) times a day to palms 60 g 1 ??? cyclobenzaprine (FLEXERIL) 10 MG tablet Take 10 mg by mouth once as needed for muscle spasms. Takes only with severe symptoms, usually taking baclofen.     ??? ergocalciferol (VITAMIN D2) 1,250 mcg (50,000 unit) capsule Take 1 capsule (50,000 Units total) by mouth once a week for 12 doses. 4 capsule 2   ??? escitalopram oxalate (LEXAPRO) 10 MG tablet Take 1 tablet by mouth daily 90 tablet 3   ??? gabapentin (NEURONTIN) 300 MG capsule TAKE 1 CAPSULE (300 MG TOTAL) BY MOUTH NIGHTLY. 90 capsule 3   ??? leucovorin (WELLCOVORIN) 5 mg tablet Take 3 tablets (15 mg total) by mouth every seven (7) days. Take 12-24 hours after methotrexate. 36 tablet 3   ??? loratadine (CLARITIN) 10 mg tablet Take 1 tablet (10 mg total) by mouth once as needed for allergies. 90 tablet 3   ??? menthol/camphor (CAMPHOR-MENTHOL) 4-10 % Crea Apply topically.     ??? methotrexate, PF, (RASUVO, PF,) 15 mg/0.3 mL AtIn Inject the contents of 1 syringe (15 mg) under the skin every 7 days 3.6 mL 1   ??? phenylephrine (SUDAFED PE) 10 MG Tab Take 10 mg by mouth every four (4) hours as needed.     ??? phosphorated carbohydrate (EMETROL) solution Take 15 mL by mouth once as needed for nausea.     ??? secukinumab (COSENTYX PEN, 2 PENS,) 150 mg/mL PnIj injection Inject the contents of  2 pens (300 mg) under the skin every twenty-eight (28) days. 6 mL 3   ??? [EXPIRED] secukinumab (COSENTYX PEN, 2 PENS,) 150 mg/mL PnIj injection Inject 2 mL (300 mg total) under the skin once a week. 10 mL 0     No current facility-administered medications on file prior to visit.        Allergies: Prednisone, Sulfa (sulfonamide antibiotics), Sulfasalazine, and Enbrel [etanercept]    Review of Systems: Denies fevers, chills. No nausea, vomiting, diarrhea. No other skin complaints.     Physical Examination:  General: Well-developed, well-nourished, no acute distress.   Neuro : Alert and oriented, answers questions appropriately.  Mouth: no lesions of lips or oral mucosa Ext: there is thickening of her right first toenail, there is diffuse brown color of her 1-5 toes on her right foot, and 1-3 and 5th toes on the left  Skin: Per patient request, focused exam with inspection and palpation of the scalp, face, neck, back, bilateral upper extremities, bilateral lower extremities was performed today. Findings were normal with exception of the following:  Erythema of bilateral thenar eminences w few scattered pustules, similar findings on arches of bilateral soles  Erythematous tender papules and nodules over mons pubis, inner thighs

## 2019-10-22 MED FILL — CLINDAMYCIN 1 % LOTION: 30 days supply | Qty: 60 | Fill #0 | Status: AC

## 2019-10-22 NOTE — Unmapped (Signed)
[facility] Clinical Assessment & Refill Coordination Note    Mary Waters, DOB: 03-29-1966  Phone: [phone] (home)     All above HIPAA information was verified with patient.     Specialty Medication(s):   Inflammatory Disorders: Cosentyx and Rasuvo     Current Outpatient Medications   Medication Sig Dispense Refill   ??? acetaminophen (TYLENOL) 500 MG tablet Take 1,000 mg by mouth.     ??? alcohol swabs (ALCOHOL PADS) PadM Use as directed to prepare injection sites 100 each PRN   ??? baclofen (LIORESAL) 10 MG tablet TAKE 1 TABLET BY MOUTH THREE TIMES DAILY AS NEEDED FOR MUSCLE SPASMS 270 tablet 3   ??? clindamycin (CLEOCIN T) 1 % lotion Apply topically Two (2) times a day to affected bumps until resolved 60 mL 5   ??? clobetasoL (TEMOVATE) 0.05 % ointment Apply topically Two (2) times a day to palms 60 g 1   ??? cyclobenzaprine (FLEXERIL) 10 MG tablet Take 10 mg by mouth once as needed for muscle spasms. Takes only with severe symptoms, usually taking baclofen.     ??? ergocalciferol (VITAMIN D2) 1,250 mcg (50,000 unit) capsule Take 1 capsule (50,000 Units total) by mouth once a week for 12 doses. 4 capsule 2   ??? escitalopram oxalate (LEXAPRO) 10 MG tablet Take 1 tablet by mouth daily 90 tablet 3   ??? gabapentin (NEURONTIN) 300 MG capsule TAKE 1 CAPSULE (300 MG TOTAL) BY MOUTH NIGHTLY. 90 capsule 3   ??? leucovorin (WELLCOVORIN) 5 mg tablet Take 3 tablets (15 mg total) by mouth every seven (7) days. Take 12-24 hours after methotrexate. 36 tablet 3   ??? loratadine (CLARITIN) 10 mg tablet Take 1 tablet (10 mg total) by mouth once as needed for allergies. 90 tablet 3   ??? menthol/camphor (CAMPHOR-MENTHOL) 4-10 % Crea Apply topically.     ??? methotrexate, PF, (RASUVO, PF,) 15 mg/0.3 mL AtIn Inject the contents of 1 syringe (15 mg) under the skin every 7 days 3.6 mL 1   ??? phenylephrine (SUDAFED PE) 10 MG Tab Take 10 mg by mouth every four (4) hours as needed. ??? phosphorated carbohydrate (EMETROL) solution Take 15 mL by mouth once as needed for nausea.     ??? secukinumab (COSENTYX PEN, 2 PENS,) 150 mg/mL PnIj injection Inject the contents of 2 pens (300 mg) under the skin every twenty-eight (28) days. 6 mL 3     No current facility-administered medications for this visit.         Changes to medications: Mary Waters reports no changes at this time.    Allergies   Allergen Reactions   ??? Prednisone Hives   ??? Sulfa (Sulfonamide Antibiotics) Hives     Other reaction(s): HIVES  Other reaction(s): HIVES   ??? Sulfasalazine      Other reaction(s): HIVES   ??? Enbrel [Etanercept] Rash       Changes to allergies: No    SPECIALTY MEDICATION ADHERENCE     Cosentyx 150mg /ml: 0 days of medicine on hand   Rasuvo 15mg /0.78ml: 21 days of medicine on hand       Medication Adherence    Patient reported X missed doses in the last month: 0  Specialty Medication: Cosentyx 150mg /ml  Patient is on additional specialty medications: Yes  Additional Specialty Medications: Rasuvo 15mg /0.69ml  Patient Reported Additional Medication X Missed Doses in the Last Month: 0          Specialty medication(s) dose(s) confirmed: Regimen  is correct and unchanged.     Are there any concerns with adherence? No    Adherence counseling provided? Not needed    CLINICAL MANAGEMENT AND INTERVENTION      Clinical Benefit Assessment:    Do you feel the medicine is effective or helping your condition? Yes    Clinical Benefit counseling provided? Not needed    Adverse Effects Assessment:    Are you experiencing any side effects? No    Are you experiencing difficulty administering your medicine? No    Quality of Life Assessment:    How many days over the past month did your psoriatic arthritis and psoriasis keep you from your normal activities? For example, brushing your teeth or getting up in the morning. Patient declined to answer    Have you discussed this with your provider? Not needed    Therapy Appropriateness: Is therapy appropriate? Yes, therapy is appropriate and should be continued    DISEASE/MEDICATION-SPECIFIC INFORMATION      For patients on injectable medications: Patient currently has 0 doses left of Cosentyx and the next injection is scheduled for 10/29/2019. Patient currently has 3 doses left of Rasuvo and the next injection is scheduled for 10/22/2019.    PATIENT SPECIFIC NEEDS     ? Does the patient have any physical, cognitive, or cultural barriers? No    ? Is the patient high risk? No     ? Does the patient require a Care Management Plan? No     ? Does the patient require physician intervention or other additional services (i.e. nutrition, smoking cessation, social work)? No      SHIPPING     Specialty Medication(s) to be Shipped:   Inflammatory Disorders: Cosentyx 150mg /ml and Rasuvo 15mg /0.19ml    Other medication(s) to be shipped: none       Changes to insurance: No    Delivery Scheduled: Yes, Expected medication delivery date: 10/26/2019 for Cosentyx and 11/07/2019 for Rasuvo.     Medication will be delivered via Same Day Courier to the confirmed prescription address in [facility].    The patient will receive a drug information handout for each medication shipped and additional FDA Medication Guides as required.  Verified that patient has previously received a Conservation officer, historic buildings.    All of the patient's questions and concerns have been addressed.    Karene Fry Kerry Odonohue   [facility] Specialty Pharmacist

## 2019-10-23 ENCOUNTER — Ambulatory Visit: Admit: 2019-10-23 | Discharge: 2019-10-24 | Payer: MEDICARE | Attending: Internal Medicine | Primary: Internal Medicine

## 2019-10-23 DIAGNOSIS — M79641 Pain in right hand: Principal | ICD-10-CM

## 2019-10-23 DIAGNOSIS — L4050 Arthropathic psoriasis, unspecified: Principal | ICD-10-CM

## 2019-10-23 DIAGNOSIS — M79642 Pain in left hand: Principal | ICD-10-CM

## 2019-10-23 NOTE — Unmapped (Addendum)
Ultrasound Clinic Note    Study Type: Complete Diagnostic scan performed according to EULAR/OMERACT guidelines (1,2).    Indication: hand pain in a patient with psoriatic arthritis     Diagnosis code:    Diagnosis ICD-10-CM Associated Orders   1. Pain in both hands  M79.641     M79.642    2. Psoriatic arthritis (CMS-HCC)  L40.50       Study Site: left 2-3rd MCPs and PIPs     Equipment: Sonosite Xporte with linear transducer    Findings:  Orthogonal views of the left hand were obtained in gray scale and with color power Doppler and revealed the following:     Left 2nd & 3rd PIP volar transverse views showed abnormal flexor tendon pathology with increased hypoechogenicity of the tendon and small tendon sheath effusions.     Left 3rd MCP volar transverse view showed a tendon sheath effusion and hypoechoic areas within the tendon.     Left 3rd MCP volar longitudinal view showed small synovitis with negative CPD signal.     The remainder of visualized muscle, tendon, joint, bone and soft tissue were without any abnormalities.    Impressions:   Diffuse flexor tenosynovitis and tendonopathy of the hand, as well as resolving synovitis, consistent partially treated psoriatic arthritis.     Dr. Berton Lan was present during this procedure.     Janae Bridgeman, MD  Rheumatology Fellow PGY5  Pager [phone]  10/23/2019    Images are stored in the Division of Rheumatology.  CPT [phone]    References:  1. Backhaus et al. Guidelines for musculoskeletal ultrasound in rheumatology.  Ann Rheum Dis [phone].  2. Galen Manila al. Musculoskeletal ultrasound including definitions for ultrasonographic pathology.  J Rheumatol 2440;10:2725-3.    Left 2nd PIP palmar long      Left 2nd PIP palmar transverse      Left 3rd PIP palmar long      Left 3rd PIP palmar transverse      Left 3rd MCP palmar transverse

## 2019-10-30 MED FILL — COSENTYX PEN 300 MG/2 PENS (150 MG/ML) SUBCUTANEOUS: SUBCUTANEOUS | 84 days supply | Qty: 6 | Fill #0

## 2019-10-30 MED FILL — COSENTYX PEN 300 MG/2 PENS (150 MG/ML) SUBCUTANEOUS: 84 days supply | Qty: 6 | Fill #0 | Status: AC

## 2019-11-06 MED FILL — RASUVO (PF) 15 MG/0.3 ML SUBCUTANEOUS AUTO-INJECTOR: 84 days supply | Qty: 4 | Fill #1 | Status: AC

## 2019-11-06 MED FILL — RASUVO (PF) 15 MG/0.3 ML SUBCUTANEOUS AUTO-INJECTOR: SUBCUTANEOUS | 84 days supply | Qty: 3.6 | Fill #1

## 2019-11-09 MED FILL — ERGOCALCIFEROL (VITAMIN D2) 1,250 MCG (50,000 UNIT) CAPSULE: ORAL | 28 days supply | Qty: 4 | Fill #1

## 2019-11-09 MED FILL — ERGOCALCIFEROL (VITAMIN D2) 1,250 MCG (50,000 UNIT) CAPSULE: 28 days supply | Qty: 4 | Fill #1 | Status: AC

## 2019-11-28 NOTE — Unmapped (Signed)
patient has enough medication on hand, rescheduling call for 1/13
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
## 2020-01-07 ENCOUNTER — Institutional Professional Consult (permissible substitution): Admit: 2020-01-07 | Discharge: 2020-01-08 | Payer: MEDICARE | Attending: Family | Primary: Family

## 2020-01-07 DIAGNOSIS — L4050 Arthropathic psoriasis, unspecified: Principal | ICD-10-CM

## 2020-01-07 DIAGNOSIS — R208 Other disturbances of skin sensation: Principal | ICD-10-CM

## 2020-01-07 DIAGNOSIS — F419 Anxiety disorder, unspecified: Principal | ICD-10-CM

## 2020-01-07 DIAGNOSIS — L409 Psoriasis, unspecified: Principal | ICD-10-CM

## 2020-01-08 MED ORDER — LEUCOVORIN CALCIUM 5 MG TABLET
ORAL_TABLET | ORAL | 3 refills | 84.00000 days | Status: CP
Start: 2020-01-08 — End: ?
  Filled 2020-01-14: qty 36, 84d supply, fill #0

## 2020-01-08 MED ORDER — RASUVO (PF) 15 MG/0.3 ML SUBCUTANEOUS AUTO-INJECTOR
SUBCUTANEOUS | 1 refills | 84.00000 days | Status: CP
Start: 2020-01-08 — End: ?
  Filled 2020-01-14: qty 3.6, 84d supply, fill #0

## 2020-01-08 NOTE — Unmapped (Signed)
[facility] Refill Coordination Note    Specialty Medication(s) to be Shipped:   Inflammatory Disorders: Cosentyx and Rasuvo    Other medication(s) to be shipped: Leucovorin, Gabapentin, escitalopram,  Clindamycin lotion     Mary Waters, DOB: 1966/06/05  Phone: [phone] (home)       All above HIPAA information was verified with patient.     Was a Nurse, learning disability used for this call? No    Completed refill call assessment today to schedule patient's medication shipment from the [facility] [phone]).       Specialty medication(s) and dose(s) confirmed: Regimen is correct and unchanged.   Changes to medications: Isyss reports no changes at this time.  Changes to insurance: No  Questions for the pharmacist: No    Confirmed patient received Welcome Packet with first shipment. The patient will receive a drug information handout for each medication shipped and additional FDA Medication Guides as required.       DISEASE/MEDICATION-SPECIFIC INFORMATION        For patients on injectable medications: Patient currently has 1 doses left.  Next injection is scheduled for 01/11/20.    SPECIALTY MEDICATION ADHERENCE     Medication Adherence    Patient reported X missed doses in the last month: 1  Specialty Medication: Rasuvo  Patient is on additional specialty medications: Yes  Additional Specialty Medications: Consentyx  Patient Reported Additional Medication X Missed Doses in the Last Month: 0                Cosentyx 150 mg/ml: 3 days of medicine on hand   Methotrexate 15/0.3 mg/ml: 0 days of medicine on hand           SHIPPING     Shipping address confirmed in Epic.     Delivery Scheduled: Yes, Expected medication delivery date: 01/11/20.  However, Rx request for refills was sent to the provider as there are none remaining.     Medication will be delivered via Same Day Courier to the prescription address in [facility].    [facility] Specialty Technician

## 2020-01-08 NOTE — Unmapped (Signed)
Methotrexate & Leucovorin refill  Last ov: 10/23/2019   Next ov: 02/05/2020     Labs:   AST   Date Value Ref Range Status   10/03/2019 32 14 - 38 U/L Final   11/07/2014 20 14 - 38 U/L Final     ALT   Date Value Ref Range Status   10/03/2019 20 <35 U/L Final   11/07/2014 18 15 - 48 U/L Final     Creatinine   Date Value Ref Range Status   10/03/2019 0.82 0.60 - 1.00 mg/dL Final   16/09/9603 5.40 0.60 - 1.00 mg/dL Final     WBC   Date Value Ref Range Status   10/03/2019 5.5 4.5 - 11.0 10*9/L Final     HGB   Date Value Ref Range Status   10/03/2019 12.3 12.0 - 16.0 g/dL Final     HCT   Date Value Ref Range Status   10/03/2019 39.3 36.0 - 46.0 % Final     MCV   Date Value Ref Range Status   10/03/2019 95.2 80.0 - 100.0 fL Final     RDW   Date Value Ref Range Status   10/03/2019 13.9 12.0 - 15.0 % Final     Platelet   Date Value Ref Range Status   10/03/2019 311 150 - 440 10*9/L Final     Neutrophils %   Date Value Ref Range Status   10/03/2019 42.1 % Final     Lymphocytes %   Date Value Ref Range Status   10/03/2019 45.0 % Final     Monocytes %   Date Value Ref Range Status   10/03/2019 6.7 % Final     Eosinophils %   Date Value Ref Range Status   10/03/2019 2.7 % Final     Basophils %   Date Value Ref Range Status   10/03/2019 0.4 % Final

## 2020-01-14 MED FILL — ESCITALOPRAM 10 MG TABLET: ORAL | 90 days supply | Qty: 90 | Fill #2

## 2020-01-14 MED FILL — COSENTYX PEN 300 MG/2 PENS (150 MG/ML) SUBCUTANEOUS: SUBCUTANEOUS | 84 days supply | Qty: 6 | Fill #1

## 2020-01-14 MED FILL — CLINDAMYCIN 1 % LOTION: TOPICAL | 30 days supply | Qty: 60 | Fill #1

## 2020-01-14 MED FILL — GABAPENTIN 300 MG CAPSULE: 90 days supply | Qty: 90 | Fill #1 | Status: AC

## 2020-01-14 MED FILL — ESCITALOPRAM 10 MG TABLET: 90 days supply | Qty: 90 | Fill #2 | Status: AC

## 2020-01-14 MED FILL — LEUCOVORIN CALCIUM 5 MG TABLET: 84 days supply | Qty: 36 | Fill #0 | Status: AC

## 2020-01-14 MED FILL — RASUVO (PF) 15 MG/0.3 ML SUBCUTANEOUS AUTO-INJECTOR: 84 days supply | Qty: 4 | Fill #0 | Status: AC

## 2020-01-14 MED FILL — GABAPENTIN 300 MG CAPSULE: 90 days supply | Qty: 90 | Fill #1

## 2020-01-14 MED FILL — COSENTYX PEN 300 MG/2 PENS (150 MG/ML) SUBCUTANEOUS: 84 days supply | Qty: 6 | Fill #1 | Status: AC

## 2020-01-14 MED FILL — CLINDAMYCIN 1 % LOTION: 30 days supply | Qty: 60 | Fill #1 | Status: AC

## 2020-01-24 MED ORDER — DOXYCYCLINE HYCLATE 100 MG CAPSULE: 100 mg | capsule | Freq: Two times a day (BID) | 0 refills | 7 days | Status: AC

## 2020-01-24 MED ORDER — DOXYCYCLINE HYCLATE 100 MG CAPSULE
ORAL_CAPSULE | Freq: Two times a day (BID) | ORAL | 0 refills | 7.00000 days | Status: CP
Start: 2020-01-24 — End: 2020-01-31
  Filled 2020-01-28: qty 14, 7d supply, fill #0

## 2020-01-24 NOTE — Unmapped (Signed)
Last seen in October. This looks pretty inflamed. Do you want her to be seen or try a different treatment?

## 2020-01-24 NOTE — Unmapped (Signed)
Thank you :)

## 2020-01-25 NOTE — Unmapped (Signed)
Addended by: Ignacia Palma on: 01/24/2020 04:34 PM     Modules accepted: Orders

## 2020-01-28 MED FILL — DOXYCYCLINE HYCLATE 100 MG CAPSULE: 7 days supply | Qty: 14 | Fill #0 | Status: AC

## 2020-02-05 ENCOUNTER — Institutional Professional Consult (permissible substitution): Admit: 2020-02-05 | Discharge: 2020-02-06 | Payer: MEDICARE
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
## 2020-02-14 ENCOUNTER — Ambulatory Visit: Admit: 2020-02-14 | Discharge: 2020-02-14 | Payer: MEDICARE

## 2020-02-14 LAB — CBC W/ AUTO DIFF
BASOPHILS RELATIVE PERCENT: 1.5 %
EOSINOPHILS ABSOLUTE COUNT: 0.1 10*9/L (ref 0.0–0.7)
EOSINOPHILS RELATIVE PERCENT: 2.4 %
HEMATOCRIT: 35.9 % (ref 35.0–44.0)
HEMOGLOBIN: 11.8 g/dL — ABNORMAL LOW (ref 12.0–15.5)
LYMPHOCYTES ABSOLUTE COUNT: 2.3 10*9/L (ref 0.7–4.0)
MEAN CORPUSCULAR HEMOGLOBIN CONC: 33 g/dL (ref 30.0–36.0)
MEAN CORPUSCULAR HEMOGLOBIN: 29.6 pg (ref 26.0–34.0)
MEAN CORPUSCULAR VOLUME: 89.7 fL (ref 82.0–98.0)
MEAN PLATELET VOLUME: 6.8 fL — ABNORMAL LOW (ref 7.0–10.0)
MONOCYTES ABSOLUTE COUNT: 0.6 10*9/L (ref 0.1–1.0)
MONOCYTES RELATIVE PERCENT: 10.6 %
NEUTROPHILS ABSOLUTE COUNT: 2.4 10*9/L (ref 1.7–7.7)
PLATELET COUNT: 333 10*9/L (ref 150–450)
RED BLOOD CELL COUNT: 4 10*12/L (ref 3.90–5.03)
RED CELL DISTRIBUTION WIDTH: 14.1 % (ref 12.0–15.0)
WBC ADJUSTED: 5.5 10*9/L (ref 3.5–10.5)

## 2020-02-14 LAB — CREATININE
CREATININE: 0.83 mg/dL (ref 0.60–1.00)
Creatinine:MCnc:Pt:Ser/Plas:Qn:: 0.83

## 2020-02-14 LAB — ALT (SGPT): Alanine aminotransferase:CCnc:Pt:Ser/Plas:Qn:: 15

## 2020-02-14 LAB — ALBUMIN: Albumin:MCnc:Pt:Ser/Plas:Qn:: 4.5

## 2020-02-14 LAB — AST (SGOT): Aspartate aminotransferase:CCnc:Pt:Ser/Plas:Qn:: 25

## 2020-02-14 LAB — PLATELET COUNT: Platelets:NCnc:Pt:Bld:Qn:Automated count: 333

## 2020-02-29 DIAGNOSIS — R928 Other abnormal and inconclusive findings on diagnostic imaging of breast: Principal | ICD-10-CM

## 2020-03-25 NOTE — Unmapped (Signed)
[facility] Clinical Assessment & Refill Coordination Note    Mary Waters, DOB: October 13, 1966  Phone: [phone] (home)     All above HIPAA information was verified with patient.     Was a Nurse, learning disability used for this call? No    Specialty Medication(s):   Inflammatory Disorders: Cosentyx and Rasuvo     Current Outpatient Medications   Medication Sig Dispense Refill   ??? acetaminophen (TYLENOL) 500 MG tablet Take 1,000 mg by mouth.     ??? alcohol swabs (ALCOHOL PADS) PadM Use as directed to prepare injection sites 100 each PRN   ??? baclofen (LIORESAL) 10 MG tablet TAKE 1 TABLET BY MOUTH THREE TIMES DAILY AS NEEDED FOR MUSCLE SPASMS 270 tablet 3   ??? clindamycin (CLEOCIN T) 1 % lotion Apply topically to affected bumps two (2) times a  until resolved. 60 mL 5   ??? clobetasoL (TEMOVATE) 0.05 % ointment Apply topically Two (2) times a day to palms 60 g 1   ??? cyclobenzaprine (FLEXERIL) 10 MG tablet Take 10 mg by mouth once as needed for muscle spasms. Takes only with severe symptoms, usually taking baclofen.     ??? escitalopram oxalate (LEXAPRO) 10 MG tablet Take 1 tablet by mouth daily 90 tablet 3   ??? gabapentin (NEURONTIN) 300 MG capsule TAKE 1 CAPSULE (300 MG TOTAL) BY MOUTH NIGHTLY. 90 capsule 3   ??? leucovorin (WELLCOVORIN) 5 mg tablet Take 3 tablets (15 mg total) by mouth every seven (7) days. Take 12-24 hours after methotrexate. 36 tablet 3   ??? loratadine (CLARITIN) 10 mg tablet Take 1 tablet (10 mg total) by mouth once as needed for allergies. 90 tablet 3   ??? menthol/camphor (CAMPHOR-MENTHOL) 4-10 % Crea Apply topically.     ??? methotrexate, PF, (RASUVO, PF,) 15 mg/0.3 mL AtIn Inject the contents of 1 syringe (15 mg) under the skin every 7 days 3.6 mL 1   ??? phenylephrine (SUDAFED PE) 10 MG Tab Take 10 mg by mouth every four (4) hours as needed.     ??? phosphorated carbohydrate (EMETROL) solution Take 15 mL by mouth once as needed for nausea.     ??? secukinumab (COSENTYX PEN, 2 PENS,) 150 mg/mL PnIj injection Inject the contents of 2 pens (300 mg) under the skin every twenty-eight (28) days. 6 mL 3     No current facility-administered medications for this visit.         Changes to medications: Mary Waters reports no changes at this time.    Allergies   Allergen Reactions   ??? Prednisone Hives   ??? Sulfa (Sulfonamide Antibiotics) Hives     Other reaction(s): HIVES  Other reaction(s): HIVES   ??? Sulfasalazine      Other reaction(s): HIVES   ??? Enbrel [Etanercept] Rash       Changes to allergies: No    SPECIALTY MEDICATION ADHERENCE     Cosentyx 200mg /ml: 28 days of medicine on hand   Rasuvo 15mg /0.65ml: 21 days of medicine on hand     Medication Adherence    Patient reported X missed doses in the last month: 0  Specialty Medication: Cosentyx 150mg /ml  Patient is on additional specialty medications: Yes  Additional Specialty Medications: Rasuvo 15mg /0.52ml  Patient Reported Additional Medication X Missed Doses in the Last Month: 1-2          Specialty medication(s) dose(s) confirmed: Regimen is correct and unchanged.     Are there any concerns with adherence? No -  doses where held due to a minor infection ~1 month ago and then COVID-19 vaccine series    Adherence counseling provided? Not needed    CLINICAL MANAGEMENT AND INTERVENTION      Clinical Benefit Assessment:    Do you feel the medicine is effective or helping your condition? Yes    Clinical Benefit counseling provided? Not needed    Adverse Effects Assessment:    Are you experiencing any side effects? No    Are you experiencing difficulty administering your medicine? No    Quality of Life Assessment:    Rheumatology:   Quality of Life    On a scale of 1 ??? 10 with 1 representing not at all and 10 representing completely ??? how has your rheumatologic condition affected your:  Daily pain level?: decline to answer  Ability to complete your regular daily tasks (prepare meals, get dressed, etc.)?: decline to answer  Ability to participate in social or family activities?: decline to answer         Have you discussed this with your provider? Not needed    Therapy Appropriateness:    Is therapy appropriate? Yes, therapy is appropriate and should be continued    DISEASE/MEDICATION-SPECIFIC INFORMATION      For patients on injectable medications: Patient currently has 1 Cosentyx dose left and the next injection is scheduled for 03/25/2020. Patient currently has 3 Rasuvo doses left and the next injection is scheduled for 03/31/2020.    PATIENT SPECIFIC NEEDS     ? Does the patient have any physical, cognitive, or cultural barriers? No    ? Is the patient high risk? No     ? Does the patient require a Care Management Plan? No     ? Does the patient require physician intervention or other additional services (i.e. nutrition, smoking cessation, social work)? No      SHIPPING     Specialty Medication(s) to be Shipped: none    Other medication(s) to be shipped: none       Changes to insurance: No    Delivery Scheduled: Patient declined refill at this time due to having a supply of medication on hand right now.     The patient will receive a drug information handout for each medication shipped and additional FDA Medication Guides as required.  Verified that patient has previously received a Conservation officer, historic buildings.    All of the patient's questions and concerns have been addressed.    Karene Fry Amro Winebarger   [facility] Specialty Pharmacist

## 2020-04-22 NOTE — Unmapped (Signed)
[facility] Refill Coordination Note    Specialty Medication(s) to be Shipped:   Inflammatory Disorders: Cosentyx and Rasuvo    Other medication(s) to be shipped: clindamycin, escitalopram, gabapentin, leucovorin     Mary Waters, DOB: 26-Feb-1966  Phone: [phone] (home)       All above HIPAA information was verified with patient.     Was a Nurse, learning disability used for this call? No    Completed refill call assessment today to schedule patient's medication shipment from the [facility] [phone]).       Specialty medication(s) and dose(s) confirmed: Regimen is correct and unchanged.   Changes to medications: [name] reports no changes at this time.  Changes to insurance: No  Questions for the pharmacist: No    Confirmed patient received Welcome Packet with first shipment. The patient will receive a drug information handout for each medication shipped and additional FDA Medication Guides as required.       DISEASE/MEDICATION-SPECIFIC INFORMATION        For patients on injectable medications: Patient currently has 0 doses left.  Next injection is scheduled for 5/12.    SPECIALTY MEDICATION ADHERENCE     Medication Adherence    Patient reported X missed doses in the last month: 0  Specialty Medication: Cosentyx  Patient is on additional specialty medications: Yes  Additional Specialty Medications: Rasuvo  Patient Reported Additional Medication X Missed Doses in the Last Month: 0  Patient is on more than two specialty medications: No  Any gaps in refill history greater than 2 weeks in the last 3 months: no  Demonstrates understanding of importance of adherence: yes  Informant: patient                Cosentyx 150mg /ml: Patient has 0 days of medication on hand    Rasuvo 15mg /0.93ml: Patient has 0 days of medication on hand      SHIPPING     Shipping address confirmed in Epic.     Delivery Scheduled: Yes, Expected medication delivery date: 5/7.     Medication will be delivered via Same Day Courier to the prescription address in Epic WAM.    Olga Millers   [facility] Specialty Technician

## 2020-04-25 MED FILL — COSENTYX PEN 300 MG/2 PENS (150 MG/ML) SUBCUTANEOUS: 84 days supply | Qty: 6 | Fill #2 | Status: AC

## 2020-04-25 MED FILL — ESCITALOPRAM 10 MG TABLET: 90 days supply | Qty: 90 | Fill #3 | Status: AC

## 2020-04-25 MED FILL — CLINDAMYCIN 1 % LOTION: 30 days supply | Qty: 60 | Fill #2 | Status: AC

## 2020-04-25 MED FILL — GABAPENTIN 300 MG CAPSULE: 90 days supply | Qty: 90 | Fill #2

## 2020-04-25 MED FILL — GABAPENTIN 300 MG CAPSULE: 90 days supply | Qty: 90 | Fill #2 | Status: AC

## 2020-04-25 MED FILL — RASUVO (PF) 15 MG/0.3 ML SUBCUTANEOUS AUTO-INJECTOR: SUBCUTANEOUS | 84 days supply | Qty: 3.6 | Fill #1

## 2020-04-25 MED FILL — RASUVO (PF) 15 MG/0.3 ML SUBCUTANEOUS AUTO-INJECTOR: 84 days supply | Qty: 4 | Fill #1 | Status: AC

## 2020-04-25 MED FILL — LEUCOVORIN CALCIUM 5 MG TABLET: 84 days supply | Qty: 36 | Fill #1 | Status: AC

## 2020-04-25 MED FILL — COSENTYX PEN 300 MG/2 PENS (150 MG/ML) SUBCUTANEOUS: SUBCUTANEOUS | 84 days supply | Qty: 6 | Fill #2

## 2020-04-25 MED FILL — CLINDAMYCIN 1 % LOTION: TOPICAL | 30 days supply | Qty: 60 | Fill #2

## 2020-04-25 MED FILL — LEUCOVORIN CALCIUM 5 MG TABLET: ORAL | 84 days supply | Qty: 36 | Fill #1

## 2020-04-25 MED FILL — ESCITALOPRAM 10 MG TABLET: ORAL | 90 days supply | Qty: 90 | Fill #3

## 2020-05-26 NOTE — Unmapped (Signed)
patient has enough medication on hand, rescheduling refill call for 7/23

## 2020-06-05 ENCOUNTER — Ambulatory Visit: Admit: 2020-06-05 | Discharge: 2020-06-05 | Payer: MEDICARE

## 2020-06-05 LAB — CBC W/ AUTO DIFF
BASOPHILS ABSOLUTE COUNT: 0.1 10*9/L (ref 0.0–0.1)
BASOPHILS RELATIVE PERCENT: 0.9 %
EOSINOPHILS ABSOLUTE COUNT: 0.1 10*9/L (ref 0.0–0.7)
EOSINOPHILS RELATIVE PERCENT: 2.3 %
HEMATOCRIT: 35.7 % (ref 35.0–44.0)
HEMOGLOBIN: 11.2 g/dL — ABNORMAL LOW (ref 12.0–15.5)
LYMPHOCYTES ABSOLUTE COUNT: 2.6 10*9/L (ref 0.7–4.0)
LYMPHOCYTES RELATIVE PERCENT: 42.3 %
MEAN CORPUSCULAR HEMOGLOBIN CONC: 31.4 g/dL (ref 30.0–36.0)
MEAN CORPUSCULAR HEMOGLOBIN: 28 pg (ref 26.0–34.0)
MEAN PLATELET VOLUME: 7.3 fL (ref 7.0–10.0)
MONOCYTES ABSOLUTE COUNT: 0.7 10*9/L (ref 0.1–1.0)
MONOCYTES RELATIVE PERCENT: 11.4 %
NEUTROPHILS ABSOLUTE COUNT: 2.6 10*9/L (ref 1.7–7.7)
NUCLEATED RED BLOOD CELLS: 0 /100{WBCs} (ref <=4)
PLATELET COUNT: 300 10*9/L (ref 150–450)
RED BLOOD CELL COUNT: 4 10*12/L (ref 3.90–5.03)

## 2020-06-05 LAB — CREATININE: CREATININE: 0.8 mg/dL (ref 0.50–0.80)

## 2020-06-05 LAB — C-REACTIVE PROTEIN: C reactive protein:MCnc:Pt:Ser/Plas:Qn:: 35 — ABNORMAL HIGH

## 2020-06-05 LAB — RED BLOOD CELL COUNT: Lab: 4

## 2020-06-05 LAB — EGFR CKD-EPI AA FEMALE: Glomerular filtration rate/1.73 sq M.predicted.black:ArVRat:Pt:Ser/Plas/Bld:Qn:Creatinine-based formula (CKD-EPI): >90

## 2020-06-05 LAB — ALT (SGPT): Alanine aminotransferase:CCnc:Pt:Ser/Plas:Qn:: 10

## 2020-06-05 LAB — ERYTHROCYTE SEDIMENTATION RATE: Lab: 120 — ABNORMAL HIGH

## 2020-06-05 LAB — AST (SGOT): Aspartate aminotransferase:CCnc:Pt:Ser/Plas:Qn:: 19

## 2020-06-05 NOTE — Unmapped (Signed)
Patient Name: Mary Waters  PCP: ??Noralyn Pick, FNP  Source of History: patient and records  Date of Visit: 06/05/20 2:45 PM    Chief Compliant: follow-up for PsA    PRIOR RHEUMATOLOGIC HISTORY:?? Psoriatis arthritis.   Established care in September 2016.  Ultrasound in 2016 showing small PIP joint effusions, presence of enthesophytes, and achilles tendon inflammation/tendinitis consistent with an underlying spondyloarthropathy such as psoriatic arthritis.    Humira 2016-06/2016 with loss of efficacy  Methotrexate added 01/2016 for persistent peripheral arthritis; switched to auto-injector 07/2019  Enbrel 06/2016-09/2017 with loss of efficacy  Cimzia 09/2017 to 07/2019.   Cosentyx  07/2019 -     Never pursued surgical decompression for cervical myelopathy. Has numbness and paresthesias in both hands.     HPI: Mary Waters is a 54 y.o. female who presents for her follow-up for psoriatic arthritis with known cervical myelopathy. Last seen by me in person in October 2020. Completed ultrasound visit in November 2020 with findings suggestive for partially treated PsA. No change to regimen including Rasuvo 15 mg once a week with leucovorin 15 mg once a week as well as Cosentyx 300 mg once a month. Here today for follow-up in person.    Today, patient reports she is doing well. She is feeling stiff with the drive but otherwise doing well. She feels like Cosentyx is working just as well as the Enbrel. She has been exercising and has lost 30 lbs. She reports compliance with methotrexate 15 mg once a week and leucovorin 15 mg once a week except while she was on the COVID-19 vaccine. She did skip a dose after the 2nd dose but not with first. She reports skin is doing well. She reports skin cleared up following the COVID-19 vaccine series. She reports that she has pain in the thoracic spine. She reports mild stiffness in the knees.  She reports 70-75% satisfied with her current medication regimen. She does feel better compared to where she was in the Fall 2020 when ultrasound was completed. There was a period of time where she would feel as if she had a fever but temperature measurement was normal. She would take OTC acetaminophen and then break out in a cold sweat like a fever broke. This has resolved and is no longer happening. She is feeling better about her medical conditions. She is able to give herself the Cosentyx.     ROS??: Attests to the above, otherwise, review of all other systems is negative.   ????  Past Medical and Surgical History:  ??  Patient Active Problem List    Diagnosis Date Noted   ??? Cervical myelopathy (CMS-HCC) 12/09/2016   ??? Psoriatic arthritis (CMS-HCC) 01/07/2016   ??? Menopause 08/07/2015   ??? History of tobacco use 10/23/2014   ??? Fibromyalgia 04/01/2014   ??? Carpal tunnel syndrome of right wrist 04/01/2014   ??? GERD (gastroesophageal reflux disease) 04/01/2014   ??? Chronic pain syndrome 04/01/2014   ??? Morbid obesity due to excess calories (CMS-HCC) 04/01/2014   ??? Psoriasis 04/01/2014   ??? Allergic rhinitis 02/04/2014   ??? Cataracts, both eyes 10/03/2013   ??? Health care maintenance 12/23/2010   ??? IBS (irritable bowel syndrome) 12/20/2008     Past Surgical History:   Procedure Laterality Date   ??? CESAREAN SECTION     ??? DILATION AND CURETTAGE OF UTERUS     ??? HYSTERECTOMY  2001    TAH   ??? KNEE SURGERY     ???  TOTAL ABDOMINAL HYSTERECTOMY       Allergies:   ??  Allergies   Allergen Reactions   ??? Prednisone Hives   ??? Sulfa (Sulfonamide Antibiotics) Hives     Other reaction(s): HIVES  Other reaction(s): HIVES   ??? Sulfasalazine      Other reaction(s): HIVES   ??? Enbrel [Etanercept] Rash     Current Outpatient Medications:  ??  Current Outpatient Medications on File Prior to Visit   Medication Sig Dispense Refill   ??? acetaminophen (TYLENOL) 500 MG tablet Take 1,000 mg by mouth.     ??? clindamycin (CLEOCIN T) 1 % lotion Apply topically to affected bumps two (2) times a  until resolved. 60 mL 5   ??? clobetasoL (TEMOVATE) 0.05 % ointment Apply topically Two (2) times a day to palms 60 g 1   ??? cyclobenzaprine (FLEXERIL) 10 MG tablet Take 10 mg by mouth once as needed for muscle spasms. Takes only with severe symptoms, usually taking baclofen.     ??? escitalopram oxalate (LEXAPRO) 10 MG tablet Take 1 tablet by mouth daily 90 tablet 3   ??? gabapentin (NEURONTIN) 300 MG capsule TAKE 1 CAPSULE (300 MG TOTAL) BY MOUTH NIGHTLY. 90 capsule 3   ??? leucovorin (WELLCOVORIN) 5 mg tablet Take 3 tablets (15 mg total) by mouth every seven (7) days. Take 12-24 hours after methotrexate. 36 tablet 3   ??? loratadine (CLARITIN) 10 mg tablet Take 1 tablet (10 mg total) by mouth once as needed for allergies. 90 tablet 3   ??? menthol/camphor (CAMPHOR-MENTHOL) 4-10 % Crea Apply topically.     ??? methotrexate, PF, (RASUVO, PF,) 15 mg/0.3 mL AtIn Inject the contents of 1 syringe (15 mg) under the skin every 7 days 3.6 mL 1   ??? phenylephrine (SUDAFED PE) 10 MG Tab Take 10 mg by mouth every four (4) hours as needed.     ??? phosphorated carbohydrate (EMETROL) solution Take 15 mL by mouth once as needed for nausea.     ??? secukinumab (COSENTYX PEN, 2 PENS,) 150 mg/mL PnIj injection Inject the contents of 2 pens (300 mg) under the skin every twenty-eight (28) days. 6 mL 3   ??? alcohol swabs (ALCOHOL PADS) PadM Use as directed to prepare injection sites 100 each PRN     No current facility-administered medications on file prior to visit.   ????  ??  Immunization History   Administered Date(s) Administered   ??? COVID-19 VACC,MRNA,(PFIZER)(PF)(IM) 03/02/2020, 03/22/2020   ??? Influenza Vaccine Quad (IIV4 PF) 68mo+ injectable 09/26/2015, 10/14/2016, 12/02/2017, 11/07/2018, 10/03/2019   ??? PNEUMOCOCCAL POLYSACCHARIDE 23 03/04/2016   ??? PPD Test 09/12/2013   ??? Pneumococcal Conjugate 13-Valent 10/23/2015   ??? Rubella 01/10/1979   ??? TdaP 09/12/2013     ????  PHYSICAL EXAM??  Vital signs: BP (P) 117/72 (BP Site: L Arm, BP Position: Sitting, BP Cuff Size: Large)  - Pulse (P) 85  - Temp (P) 36.3 ??C (97.4 ??F)  - Ht (P) 172.7 cm (5' 8)  - Wt (P) 100.2 kg (221 lb)  - BMI (P) 33.60 kg/m?? Body mass index is 33.6 kg/m?? (pended).  Gen: Well-developed, well-nourished adult in no apparent distress. Normocephalic with no external signs of trauma. Pleasant and cooperative. AOx4.?  HEENT: PERRLA, EOMI, mask in place  Lungs: Broad chest excursion with good air movement. CTAB without wheezing/rhonchi/rales. ????  CV: RRR, Normal S1/S2, No murmurs/rubs/gallops heard.  PV: Warm, 2+ radial and pedal pulses, no C/C/E.????  Neuro: Good comprehension/cognition. CN 2-12  intact.  Muscle strength 5/5 in all extremities. Gait normal.????  Comprehensive Musculoskeletal Examination:????  ?? Jaw, neck without limited ROM.????  ?? Shoulders, elbows, wrists, hands, fingers:  No deformity, erythema, warmth, swelling, effusion, tenderness, limited ROM.??  ?? Occiput to wall 0 cm, chest wall expansion 5 cm, Modified Schober 15 cm to 17 cm (Last measured in October 2017 was 15 cm to 19 cm). Anterior hip pain on the right only with lifting leg.  ?? Knee, ankles, feet, toes: No deformity, erythema, warmth, swelling, effusion, tenderness, limited ROM. Knee stable to valgus/varus stress and anterior/posterior drawer sign.????  Skin: No psoriasis noted    LABORATORY  Recent Results (from the past 168 hour(s))   Creatinine    Collection Time: 06/05/20  3:51 PM   Result Value Ref Range    Creatinine 0.80 0.50 - 0.80 mg/dL    EGFR CKD-EPI Non-African American, Female 85 mL/min/1.23m2    EGFR CKD-EPI African American, Female >90 mL/min/1.46m2   AST    Collection Time: 06/05/20  3:51 PM   Result Value Ref Range    AST 19 <34 U/L   ALT    Collection Time: 06/05/20  3:51 PM   Result Value Ref Range    ALT 10 10 - 49 U/L   CRP  C-Reactive Protein    Collection Time: 06/05/20  3:51 PM   Result Value Ref Range    CRP 35.0 (H) <=10.0 mg/L   ESR Sed rate    Collection Time: 06/05/20  3:51 PM   Result Value Ref Range    Sed Rate 120 (H) 0 - 30 mm/h   CBC w/ Differential    Collection Time: 06/05/20  3:51 PM   Result Value Ref Range    WBC 6.2 3.5 - 10.5 10*9/L    RBC 4.00 3.90 - 5.03 10*12/L    HGB 11.2 (L) 12.0 - 15.5 g/dL    HCT 16.1 09.6 - 04.5 %    MCV 89.2 82.0 - 98.0 fL    MCH 28.0 26.0 - 34.0 pg    MCHC 31.4 30.0 - 36.0 g/dL    RDW 40.9 81.1 - 91.4 %    MPV 7.3 7.0 - 10.0 fL    Platelet 300 150 - 450 10*9/L    nRBC 0 <=4 /100 WBCs    Neutrophils % 43.1 %    Lymphocytes % 42.3 %    Monocytes % 11.4 %    Eosinophils % 2.3 %    Basophils % 0.9 %    Absolute Neutrophils 2.6 1.7 - 7.7 10*9/L    Absolute Lymphocytes 2.6 0.7 - 4.0 10*9/L    Absolute Monocytes 0.7 0.1 - 1.0 10*9/L    Absolute Eosinophils 0.1 0.0 - 0.7 10*9/L    Absolute Basophils 0.1 0.0 - 0.1 10*9/L       ????IMAGING????  EXAM: XR SACROILIAC JOINTS 3 OR MORE VIEWS  DATE: 06/05/2020 3:56 PM  ACCESSION: [id number]  DICTATED: 06/05/2020 9:13 PM  COMPARISON: None.  TECHNIQUE: AP and oblique views of the sacroiliac joints.  FINDINGS: Symmetric sacroiliac joints with mild degenerative changes. No erosions.  Unremarkable symphysis pubis. Bilateral hip joints are preserved. Multifocal pelvic enthesophytes. Lower lumbar degenerative changes.  IMPRESSION:Mild degenerative changes of the bilateral sacroiliac joints. No erosions.    ????GENERAL SUMMARY AND IMPRESSION: ????  ????  In summary, the patient is a 54 y.o. female here for follow-up for psoriatic arthritis on secukinumab 300 mg monthly injections with methotrexate 15 mg subcutaneous weekly  injections followed by leucovorin 15 mg once a week. Tolerating regimen with no side effects. Reports good disease control. Unfortunately, there has been decline in the Schober compared to last recorded measurement. However, during the intervening time she had been on and off therapy and had not responded well to Cimzia. Patient reports improvement in symptoms since being on Cosentyx so will not change therapy. Will reassess SI region with plain radiograph that returns primarily with degenerative changes. Lab studies for medication monitoring notes high ESR sed rate and CRP that does not fit with joint exam which did not show significant joint tenderness, stiffness or swelling. Will assess for other etiologies that can contribute to high inflammatory markers: HgbA1c, anemia studies, SPEP with IFX. Follow-up in 4 months with Carlus Pavlov [facility] and 8 months with myself or sooner pending lab studies.       RECOMMENDATIONS: ????  ????   Diagnosis ICD-10-CM Associated Orders   1. Psoriatic arthritis (CMS-HCC)  L40.50 XR Sacroiliac Joints 3 or More Views     CBC w/ Differential     Creatinine     AST     ALT     CRP  C-Reactive Protein     ESR Sed rate     25 OH Vit D     Hemoglobin A1c     Ferritin     Iron & TIBC     Transferrin     Vitamin B12 Level     Folate Level     Immunoglobulin Free LT Chains Blood     Serum Protein Electrophoresis and Immunofixation   2. Elevated erythrocyte sedimentation rate  R70.0 Hemoglobin A1c     Ferritin     Iron & TIBC     Transferrin     Vitamin B12 Level     Folate Level     Immunoglobulin Free LT Chains Blood     Serum Protein Electrophoresis and Immunofixation   3. Anemia, unspecified type  D64.9 Hemoglobin A1c     Ferritin     Iron & TIBC     Transferrin     Vitamin B12 Level     Folate Level     Immunoglobulin Free LT Chains Blood     Serum Protein Electrophoresis and Immunofixation   4. Abnormal finding of blood chemistry, unspecified   R79.9 Hemoglobin A1c     Patient Instructions   No change to medication regimen.    Labs to be taken today for medication monitoring.    X-ray of the sacroiliac joints needed today    The patient indicates understanding of these issues and agrees to the plan as outlined above.  Contact information provided for any concerns or questions in the interim.  ??  I personally spent 30 minutes face-to-face and non-face-to-face in the care of this patient, which includes all pre, intra, and post visit time on the date of service. Teah Votaw C. Scarlette Calico, MD, PhD  Assistant Professor of Medicine  Department of Medicine/Division of Rheumatology  [facility]

## 2020-06-05 NOTE — Unmapped (Signed)
No change to medication regimen.    Labs to be taken today for medication monitoring.    X-ray of the sacroiliac joints needed today

## 2020-06-06 LAB — IRON SATURATION (CALC): Iron saturation:MFr:Pt:Ser/Plas:Qn:: 13 — ABNORMAL LOW

## 2020-06-06 LAB — IMMUNOGLOBULIN FREE LT CHAINS BLOOD
K/L FLC RATIO: 1.65 (ref 0.26–1.65)
LAMBDA FREE, SER: 2.04 mg/dL (ref 0.57–2.63)

## 2020-06-06 LAB — IRON & TIBC
IRON SATURATION (CALC): 13 % — ABNORMAL LOW (ref 15–50)
IRON: 43 ug/dL (ref 35–165)
TRANSFERRIN: 259.8 mg/dL (ref 200.0–380.0)

## 2020-06-06 LAB — TRANSFERRIN: Chemistry studies:Cmplx:-:^Patient:Set:: 211 — ABNORMAL LOW

## 2020-06-06 LAB — FOLATE
FOLATE: 4.3 ng/mL (ref 2.7–20.0)
Folate:MCnc:Pt:Ser/Plas:Qn:: 4.3

## 2020-06-06 LAB — FERRITIN: Chemistry studies:Cmplx:-:^Patient:Set:: 87.6

## 2020-06-06 LAB — VITAMIN B-12: Cobalamins:MCnc:Pt:Ser/Plas:Qn:: 1950 — ABNORMAL HIGH

## 2020-06-06 LAB — KAPPA FREE,SERUM: Lab: 3.36 — ABNORMAL HIGH

## 2020-06-08 LAB — HEMOGLOBIN A1C
HEMOGLOBIN A1C: 6.1 % — ABNORMAL HIGH (ref 4.8–5.6)
Hemoglobin A1c/Hemoglobin.total:MFr:Pt:Bld:Qn:: 6.1 — ABNORMAL HIGH

## 2020-06-09 LAB — SERUM PROTEIN ELECTROPHORESIS AND IMMUNOFIXATION
ALPHA-1 GLOBULIN: 0.4 g/dL (ref 0.2–0.5)
ALPHA-2 GLOBULIN: 1 g/dL (ref 0.5–1.1)
BETA-1 GLOBULIN: 0.5 g/dL (ref 0.3–0.6)
BETA-2 GLOBULIN: 0.7 g/dL — ABNORMAL HIGH (ref 0.2–0.6)
PROTEIN TOTAL: 8.1 g/dL (ref 6.5–8.3)

## 2020-06-09 LAB — BETA-2 GLOBULIN: Lab: 0.7 — ABNORMAL HIGH

## 2020-06-11 LAB — VITAMIN D, TOTAL (25OH): Lab: 33.8

## 2020-07-22 DIAGNOSIS — L4050 Arthropathic psoriasis, unspecified: Principal | ICD-10-CM

## 2020-07-22 DIAGNOSIS — F419 Anxiety disorder, unspecified: Principal | ICD-10-CM

## 2020-07-22 DIAGNOSIS — L409 Psoriasis, unspecified: Principal | ICD-10-CM

## 2020-07-22 MED ORDER — ESCITALOPRAM 10 MG TABLET
ORAL_TABLET | ORAL | 3 refills | 0 days
Start: 2020-07-22 — End: 2021-07-22

## 2020-07-22 MED ORDER — RASUVO (PF) 15 MG/0.3 ML SUBCUTANEOUS AUTO-INJECTOR
SUBCUTANEOUS | 1 refills | 84.00000 days | Status: CP
Start: 2020-07-22 — End: ?
  Filled 2020-07-24: qty 3.6, 84d supply, fill #0

## 2020-07-22 NOTE — Unmapped (Signed)
[facility] Refill Coordination Note    Specialty Medication(s) to be Shipped:   Inflammatory Disorders: Cosentyx and Rasuvo    Other medication(s) to be shipped: escitalopram, gabapentin, leucovorin     Mary Waters, DOB: [birth year]  Phone: [phone] (home)       All above HIPAA information was verified with patient.     Was a Nurse, learning disability used for this call? No    Completed refill call assessment today to schedule patient's medication shipment from the [facility] [phone]).       Specialty medication(s) and dose(s) confirmed: Regimen is correct and unchanged.   Changes to medications: Mary Waters reports no changes at this time.  Changes to insurance: No  Questions for the pharmacist: No    Confirmed patient received Welcome Packet with first shipment. The patient will receive a drug information handout for each medication shipped and additional FDA Medication Guides as required.       DISEASE/MEDICATION-SPECIFIC INFORMATION        For patients on injectable medications: Patient currently has 0 doses left.  Next injection is scheduled for 8/10.    SPECIALTY MEDICATION ADHERENCE     Medication Adherence    Patient reported X missed doses in the last month: 0  Specialty Medication: Cosentyx  Patient is on additional specialty medications: Yes  Additional Specialty Medications: Rasuvo  Patient Reported Additional Medication X Missed Doses in the Last Month: 0  Patient is on more than two specialty medications: No  Any gaps in refill history greater than 2 weeks in the last 3 months: no  Demonstrates understanding of importance of adherence: yes  Informant: patient                Cosentyx 150mg /ml: Patient has 0 days of medication on hand    Rasuvo 15mg /0.58ml: Patient has 0 days of medication on hand      SHIPPING     Shipping address confirmed in Epic.     Delivery Scheduled: Yes, Expected medication delivery date: 8/5.  However, Rx request for refills was sent to the provider as there are none remaining.     Medication will be delivered via Same Day Courier to the prescription address in Epic WAM.    Mary Waters   [facility] Specialty Technician

## 2020-07-22 NOTE — Unmapped (Signed)
Methotrexate refill  Last ov: 06/05/2020   Next ov: 10/08/2020     Labs:   AST   Date Value Ref Range Status   06/05/2020 19 <34 U/L Final   11/07/2014 20 14 - 38 U/L Final     ALT   Date Value Ref Range Status   06/05/2020 10 10 - 49 U/L Final   11/07/2014 18 15 - 48 U/L Final     Creatinine   Date Value Ref Range Status   06/05/2020 0.80 0.50 - 0.80 mg/dL Final   16/09/9603 5.40 0.60 - 1.00 mg/dL Final     WBC   Date Value Ref Range Status   06/05/2020 6.2 3.5 - 10.5 10*9/L Final     HGB   Date Value Ref Range Status   06/05/2020 11.2 (L) 12.0 - 15.5 g/dL Final     HCT   Date Value Ref Range Status   06/05/2020 35.7 35.0 - 44.0 % Final     MCV   Date Value Ref Range Status   06/05/2020 89.2 82.0 - 98.0 fL Final     RDW   Date Value Ref Range Status   06/05/2020 13.8 12.0 - 15.0 % Final     Platelet   Date Value Ref Range Status   06/05/2020 300 150 - 450 10*9/L Final     Neutrophils %   Date Value Ref Range Status   06/05/2020 43.1 % Final     Lymphocytes %   Date Value Ref Range Status   06/05/2020 42.3 % Final     Monocytes %   Date Value Ref Range Status   06/05/2020 11.4 % Final     Eosinophils %   Date Value Ref Range Status   06/05/2020 2.3 % Final     Basophils %   Date Value Ref Range Status   06/05/2020 0.9 % Final

## 2020-07-24 DIAGNOSIS — F419 Anxiety disorder, unspecified: Principal | ICD-10-CM

## 2020-07-24 MED ORDER — ESCITALOPRAM 10 MG TABLET
ORAL_TABLET | Freq: Every day | ORAL | 3 refills | 90.00000 days
Start: 2020-07-24 — End: 2021-07-24

## 2020-07-24 MED FILL — COSENTYX PEN 300 MG/2 PENS (150 MG/ML) SUBCUTANEOUS: SUBCUTANEOUS | 84 days supply | Qty: 6 | Fill #3

## 2020-07-24 MED FILL — LEUCOVORIN CALCIUM 5 MG TABLET: ORAL | 84 days supply | Qty: 36 | Fill #2

## 2020-07-24 MED FILL — COSENTYX PEN 300 MG/2 PENS (150 MG/ML) SUBCUTANEOUS: 84 days supply | Qty: 6 | Fill #3 | Status: AC

## 2020-07-24 MED FILL — GABAPENTIN 300 MG CAPSULE: 90 days supply | Qty: 90 | Fill #3

## 2020-07-24 MED FILL — GABAPENTIN 300 MG CAPSULE: 90 days supply | Qty: 90 | Fill #3 | Status: AC

## 2020-07-24 MED FILL — RASUVO (PF) 15 MG/0.3 ML SUBCUTANEOUS AUTO-INJECTOR: 84 days supply | Qty: 4 | Fill #0 | Status: AC

## 2020-07-24 MED FILL — LEUCOVORIN CALCIUM 5 MG TABLET: 84 days supply | Qty: 36 | Fill #2 | Status: AC

## 2020-07-24 NOTE — Unmapped (Signed)
P/C requesting refill on lexapro , last ordered on 06/20/19 , last visit 05/26/20
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
## 2020-07-25 MED ORDER — ESCITALOPRAM 10 MG TABLET
ORAL_TABLET | Freq: Every day | ORAL | 3 refills | 90.00000 days | Status: CP
Start: 2020-07-25 — End: 2021-07-25
  Filled 2020-07-30: qty 90, 90d supply, fill #0

## 2020-07-29 ENCOUNTER — Ambulatory Visit: Admit: 2020-07-29 | Discharge: 2020-07-30 | Payer: MEDICARE | Attending: Family | Primary: Family

## 2020-07-29 DIAGNOSIS — R7309 Other abnormal glucose: Principal | ICD-10-CM

## 2020-07-29 DIAGNOSIS — Z1211 Encounter for screening for malignant neoplasm of colon: Principal | ICD-10-CM

## 2020-07-29 DIAGNOSIS — Z13220 Encounter for screening for lipoid disorders: Principal | ICD-10-CM

## 2020-07-29 DIAGNOSIS — R799 Abnormal finding of blood chemistry, unspecified: Principal | ICD-10-CM

## 2020-07-29 DIAGNOSIS — D649 Anemia, unspecified: Principal | ICD-10-CM

## 2020-07-29 NOTE — Unmapped (Signed)
Patient Education        Prediabetes: Care Instructions  Overview     Prediabetes is a warning sign that you're at risk for getting type 2 diabetes. It means that your blood sugar is higher than it should be. But it's not high enough to be diabetes.  The food you eat naturally turns into sugar. Your body uses the sugar for energy. Normally, an organ called the pancreas makes insulin. And insulin allows the sugar in your blood to get into your body's cells. But sometimes the body can't use insulin the right way. So the sugar stays in your blood instead. This is called insulin resistance. The buildup of sugar in your blood means you have prediabetes.  The good news is that you may be able to prevent or delay diabetes. Making small lifestyle changes, like getting active and changing your eating habits, may help you get your blood sugar back to normal. You can work with your doctor to make a treatment plan.  Follow-up care is a key part of your treatment and safety. Be sure to make and go to all appointments, and call your doctor if you are having problems. It's also a good idea to know your test results and keep a list of the medicines you take.  How can you care for yourself at home?  ?? Watch your weight. A healthy weight helps your body use insulin properly.  ?? Limit the amount of calories, sweets, and unhealthy fat you eat. Ask your doctor if you should see a dietitian. A registered dietitian can help you create meal plans that fit your lifestyle.  ?? Get at least 30 minutes of exercise on most days of the week. Exercise helps control your blood sugar. It also helps you maintain a healthy weight. Walking is a good choice. You also may want to do other activities, such as running, swimming, cycling, or playing tennis or team sports.  ?? Do not smoke. Smoking can make prediabetes worse. If you need help quitting, talk to your doctor about stop-smoking programs and medicines. These can increase your chances of quitting for good.  ?? If your doctor prescribed medicines, take them exactly as prescribed. Call your doctor if you think you are having a problem with your medicine. You will get more details on the specific medicines your doctor prescribes.  When should you call for help?  Watch closely for changes in your health, and be sure to contact your doctor if:  ?? ?? You have any symptoms of diabetes. These may include:  ? Being thirsty more often.  ? Urinating more.  ? Being hungrier.  ? Losing weight.  ? Being very tired.  ? Having blurry vision.   ?? ?? You have a wound that will not heal.   ?? ?? You have an infection that will not go away.   ?? ?? You have problems with your blood pressure.   ?? ?? You want more information about diabetes and how you can keep from getting it.   Where can you learn more?  Go to [facility] at [email or web address]  Select Patient Education under [employer]. Enter I222 in the search box to learn more about Prediabetes: Care Instructions.  Current as of: August 20, 2019??????????????????????????????Content Version: 12.9  ?? 2006-2021 Healthwise, Inc[employer].   Care instructions adapted under license by [facility]. If you have questions about a medical condition or this instruction, always ask your healthcare professional. Healthwise, Incorporated disclaims any warranty or  liability for your use of this information.

## 2020-07-30 MED FILL — ESCITALOPRAM 10 MG TABLET: 90 days supply | Qty: 90 | Fill #0 | Status: AC

## 2020-08-07 DIAGNOSIS — Z1211 Encounter for screening for malignant neoplasm of colon: Principal | ICD-10-CM

## 2020-08-26 NOTE — Unmapped (Signed)
[facility] Clinical Assessment & Refill Coordination Note    Mary Waters, DOB: 03/07/1966  Phone: [phone] (home)     All above HIPAA information was verified with patient.     Was a Nurse, learning disability used for this call? No    Specialty Medication(s):   Inflammatory Disorders: Cosentyx and Rasuvo     Current Outpatient Medications   Medication Sig Dispense Refill   ??? acetaminophen (TYLENOL) 500 MG tablet Take 1,000 mg by mouth.     ??? alcohol swabs (ALCOHOL PADS) PadM Use as directed to prepare injection sites 100 each PRN   ??? clindamycin (CLEOCIN T) 1 % lotion Apply topically to affected bumps two (2) times a  until resolved. 60 mL 5   ??? escitalopram oxalate (LEXAPRO) 10 MG tablet Take 1 tablet (10 mg total) by mouth daily. 90 tablet 3   ??? gabapentin (NEURONTIN) 300 MG capsule TAKE 1 CAPSULE (300 MG TOTAL) BY MOUTH NIGHTLY. 90 capsule 3   ??? leucovorin (WELLCOVORIN) 5 mg tablet Take 3 tablets (15 mg total) by mouth every seven (7) days. Take 12-24 hours after methotrexate. 36 tablet 3   ??? loratadine (CLARITIN) 10 mg tablet Take 1 tablet (10 mg total) by mouth once as needed for allergies. 90 tablet 3   ??? menthol/camphor (CAMPHOR-MENTHOL) 4-10 % Crea Apply topically.     ??? methotrexate, PF, (RASUVO, PF,) 15 mg/0.3 mL AtIn Inject the contents of 1 syringe (15 mg) under the skin every 7 days 3.6 mL 1   ??? phosphorated carbohydrate (EMETROL) solution Take 15 mL by mouth once as needed for nausea.     ??? secukinumab (COSENTYX PEN, 2 PENS,) 150 mg/mL PnIj injection Inject the contents of 2 pens (300 mg) under the skin every twenty-eight (28) days. 6 mL 3     No current facility-administered medications for this visit.        Changes to medications: Kandas reports no changes at this time.    Allergies   Allergen Reactions   ??? Prednisone Hives   ??? Sulfa (Sulfonamide Antibiotics) Hives     Other reaction(s): HIVES  Other reaction(s): HIVES   ??? Sulfasalazine      Other reaction(s): HIVES   ??? Enbrel [Etanercept] Rash       Changes to allergies: No    SPECIALTY MEDICATION ADHERENCE     Cosentyx 150mg /ml: 56 days of medicine on hand   Rasuvo 15mg /0.66ml: 49 days of medicine on hand     Medication Adherence    Patient reported X missed doses in the last month: 0  Specialty Medication: Cosentyx 150mg /ml  Patient is on additional specialty medications: Yes  Additional Specialty Medications: Rasuvo 15mg /0.18ml  Patient Reported Additional Medication X Missed Doses in the Last Month: 0          Specialty medication(s) dose(s) confirmed: Regimen is correct and unchanged.     Are there any concerns with adherence? No    Adherence counseling provided? Not needed    CLINICAL MANAGEMENT AND INTERVENTION      Clinical Benefit Assessment:    Do you feel the medicine is effective or helping your condition? Yes    Clinical Benefit counseling provided? Not needed    Adverse Effects Assessment:    Are you experiencing any side effects? No    Are you experiencing difficulty administering your medicine? No    Quality of Life Assessment:    Rheumatology:   Quality of Life  On a scale of 1 ??? 10 with 1 representing not at all and 10 representing completely ??? how has your rheumatologic condition affected your:  Daily pain level?: decline to answer  Ability to complete your regular daily tasks (prepare meals, get dressed, etc.)?: decline to answer  Ability to participate in social or family activities?: decline to answer         Have you discussed this with your provider? Not needed    Therapy Appropriateness:    Is therapy appropriate? Yes, therapy is appropriate and should be continued    DISEASE/MEDICATION-SPECIFIC INFORMATION      For patients on injectable medications: Patient currently has 2 Cosentyx doses left and the next injection is scheduled for 09/09/2020.  Patient currently has 7 Rasuvo doses left and the next injection is scheduled for 09/01/2020.    PATIENT SPECIFIC NEEDS     - Does the patient have any physical, cognitive, or cultural barriers? No    - Is the patient high risk? No    - Does the patient require a Care Management Plan? No     - Does the patient require physician intervention or other additional services (i.e. nutrition, smoking cessation, social work)? No      SHIPPING     Specialty Medication(s) to be Shipped: none    Other medication(s) to be shipped: No additional medications requested for fill at this time     Changes to insurance: No    Delivery Scheduled: Patient declined refill at this time due to having a supply of medication on hand.     The patient will receive a drug information handout for each medication shipped and additional FDA Medication Guides as required.  Verified that patient has previously received a Conservation officer, historic buildings.    All of the patient's questions and concerns have been addressed.    Karene Fry Ginette Bradway   [facility] Specialty Pharmacist

## 2020-08-27 LAB — COLOGUARD: COLOGUARD: NEGATIVE

## 2020-09-11 ENCOUNTER — Other Ambulatory Visit: Admit: 2020-09-11 | Discharge: 2020-09-12 | Payer: MEDICARE

## 2020-09-11 DIAGNOSIS — R799 Abnormal finding of blood chemistry, unspecified: Principal | ICD-10-CM

## 2020-09-11 DIAGNOSIS — R7309 Other abnormal glucose: Principal | ICD-10-CM

## 2020-09-11 DIAGNOSIS — Z13220 Encounter for screening for lipoid disorders: Principal | ICD-10-CM

## 2020-09-11 DIAGNOSIS — D649 Anemia, unspecified: Principal | ICD-10-CM

## 2020-09-11 LAB — LIPID PANEL
CHOLESTEROL/HDL RATIO SCREEN: 4.4 (ref 1.0–4.5)
CHOLESTEROL: 203 mg/dL — ABNORMAL HIGH (ref <=200)
NON-HDL CHOLESTEROL: 157 mg/dL — ABNORMAL HIGH (ref 70–130)
TRIGLYCERIDES: 73 mg/dL (ref 0–150)

## 2020-09-11 LAB — CHOLESTEROL: Cholesterol:MCnc:Pt:Ser/Plas:Qn:: 203 — ABNORMAL HIGH

## 2020-09-12 LAB — CBC
HEMATOCRIT: 37.7 % (ref 36.0–46.0)
HEMOGLOBIN: 12 g/dL (ref 12.0–16.0)
MEAN CORPUSCULAR VOLUME: 94.6 fL (ref 80.0–100.0)
MEAN PLATELET VOLUME: 10.8 fL — ABNORMAL HIGH (ref 7.0–10.0)
PLATELET COUNT: 305 10*9/L (ref 150–440)
RED BLOOD CELL COUNT: 3.99 10*12/L — ABNORMAL LOW (ref 4.00–5.20)
RED CELL DISTRIBUTION WIDTH: 14.2 % (ref 12.0–15.0)
WBC ADJUSTED: 4.9 10*9/L (ref 4.5–11.0)

## 2020-09-12 LAB — MEAN CORPUSCULAR VOLUME: Erythrocyte mean corpuscular volume:EntVol:Pt:RBC:Qn:Automated count: 94.6

## 2020-09-14 LAB — HEMOGLOBIN A1C
ESTIMATED AVERAGE GLUCOSE: 117 mg/dL
Hemoglobin A1c/Hemoglobin.total:MFr:Pt:Bld:Qn:: 5.7 — ABNORMAL HIGH

## 2020-10-08 ENCOUNTER — Ambulatory Visit: Admit: 2020-10-08 | Discharge: 2020-10-09 | Payer: MEDICARE

## 2020-10-08 DIAGNOSIS — L4050 Arthropathic psoriasis, unspecified: Principal | ICD-10-CM

## 2020-10-08 DIAGNOSIS — Z79899 Other long term (current) drug therapy: Principal | ICD-10-CM

## 2020-10-08 LAB — C-REACTIVE PROTEIN: C reactive protein:MCnc:Pt:Ser/Plas:Qn:: 31 — ABNORMAL HIGH

## 2020-10-08 LAB — EGFR CKD-EPI NON-AA FEMALE
Glomerular filtration rate/1.73 sq M.predicted.non black:ArVRat:Pt:Ser/Plas/Bld:Qn:Creatinine-based formula (CKD-EPI): 73

## 2020-10-08 LAB — ALT (SGPT): Alanine aminotransferase:CCnc:Pt:Ser/Plas:Qn:: 9 — ABNORMAL LOW

## 2020-10-08 LAB — ERYTHROCYTE SEDIMENTATION RATE: Lab: 111 — ABNORMAL HIGH

## 2020-10-08 LAB — AST (SGOT): Aspartate aminotransferase:CCnc:Pt:Ser/Plas:Qn:: 19

## 2020-10-08 LAB — ALBUMIN: Albumin:MCnc:Pt:Ser/Plas:Qn:: 3.9

## 2020-10-08 LAB — CREATININE: CREATININE: 0.9 mg/dL
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
## 2020-10-08 NOTE — Unmapped (Unsigned)
Administered influenza vaccine in L deltoid. Pt tolerated inj well.

## 2020-10-15 NOTE — Unmapped (Signed)
The [facility] has made a second and final attempt to reach this patient to refill the following medication:Cosentyx and Rasuvo.      We have left voicemails on the following phone numbers: [phone] and have sent a MyChart message.    Dates contacted: 10/21, 10/27  Last scheduled delivery: 8/3    The patient may be at risk of non-compliance with this medication. The patient should call the [facility] at [phone] (option 4) to refill medication.    Olga Millers   [facility] Specialty Technician

## 2020-10-22 DIAGNOSIS — L409 Psoriasis, unspecified: Principal | ICD-10-CM

## 2020-10-22 DIAGNOSIS — L4050 Arthropathic psoriasis, unspecified: Principal | ICD-10-CM

## 2020-10-22 MED ORDER — COSENTYX PEN 300 MG/2 PENS (150 MG/ML) SUBCUTANEOUS
SUBCUTANEOUS | 3 refills | 84 days
Start: 2020-10-22 — End: ?

## 2020-10-22 NOTE — Unmapped (Signed)
[facility] RHEUMATOLOGY CLINIC - PHARMACIST NOTES    Received message from [facility] that they were not able to reach patient for refill of Cosentyx. Called patient to follow up. She is a bit off schedule for Cosentyx due to recent COVID-19 vaccine but plans to call the pharmacy today to set up future refills.     Chelsea Aus

## 2020-10-22 NOTE — Unmapped (Signed)
[facility] Refill Coordination Note    Specialty Medication(s) to be Shipped:   Inflammatory Disorders: Cosentyx and Rasuvo    Other medication(s) to be shipped:escitalopram and leucovorin     [name], DOB: May 30, 1966  Phone: [phone] (home)       All above HIPAA information was verified with patient.     Was a Nurse, learning disability used for this call? No    Completed refill call assessment today to schedule patient's medication shipment from the [facility] [phone]).       Specialty medication(s) and dose(s) confirmed: Regimen is correct and unchanged.   Changes to medications: Chancy reports no changes at this time.  Changes to insurance: No  Questions for the pharmacist: No    Confirmed patient received Welcome Packet with first shipment. The patient will receive a drug information handout for each medication shipped and additional FDA Medication Guides as required.       DISEASE/MEDICATION-SPECIFIC INFORMATION        For patients on injectable medications: Patient currently has 1 of both doses left.  Next injection is scheduled for 11/04 for Rasuvo and 11/11 for Cosentyx.    SPECIALTY MEDICATION ADHERENCE     Medication Adherence    Patient reported X missed doses in the last month: 1  Specialty Medication: secukinumab (COSENTYX PEN, 2 PENS,) 150 mg/mL PnIj injection-pt recived her Covid booster shot and held off on taking Cosentyx  Patient is on additional specialty medications: Yes  Additional Specialty Medications: methotrexate, PF, (RASUVO, PF,) 15 mg/0.3 mL AtIn-patient skipped a dose after receiving her Covid Booster shot  Patient Reported Additional Medication X Missed Doses in the Last Month: 1  Patient is on more than two specialty medications: No  Any gaps in refill history greater than 2 weeks in the last 3 months: no  Demonstrates understanding of importance of adherence: yes  Informant: patient  Reliability of informant: reliable  Confirmed plan for next specialty medication refill: delivery by pharmacy  Refills needed for supportive medications: not needed                Cosentyx 150mg /ml: Patient has 28 days of medication on hand    Rasuvo 15mg /0.32ml: Patient has 7 days of medication on hand      SHIPPING     Shipping address confirmed in Epic.     Delivery Scheduled: Yes, Expected medication delivery date: 10/24/2020.  However, Rx request for refills was sent to the provider as there are none remaining.     Medication will be delivered via Same Day Courier to the prescription address in Epic WAM.    Kona Yusuf D Keyandra Swenson   [facility] Specialty Technician

## 2020-10-23 MED ORDER — COSENTYX PEN 300 MG/2 PENS (150 MG/ML) SUBCUTANEOUS
SUBCUTANEOUS | 3 refills | 84 days | Status: CP
Start: 2020-10-23 — End: ?
  Filled 2020-10-24: qty 6, 84d supply, fill #0

## 2020-10-23 NOTE — Unmapped (Signed)
Secukinumab (Cosentyx) refill  Last ov: 10/08/2020   Next ov: 02/12/2021     Labs:   AST   Date Value Ref Range Status   10/08/2020 19 <=34 U/L Final   11/07/2014 20 14 - 38 U/L Final     ALT   Date Value Ref Range Status   10/08/2020 9 (L) 10 - 49 U/L Final   11/07/2014 18 15 - 48 U/L Final     Creatinine   Date Value Ref Range Status   10/08/2020 0.90 0.55 - 1.02 mg/dL Final   16/09/9603 5.40 0.60 - 1.00 mg/dL Final     WBC   Date Value Ref Range Status   09/11/2020 4.9 4.5 - 11.0 10*9/L Final     HGB   Date Value Ref Range Status   09/11/2020 12.0 12.0 - 16.0 g/dL Final     HCT   Date Value Ref Range Status   09/11/2020 37.7 36.0 - 46.0 % Final     MCV   Date Value Ref Range Status   09/11/2020 94.6 80.0 - 100.0 fL Final     RDW   Date Value Ref Range Status   09/11/2020 14.2 12.0 - 15.0 % Final     Platelet   Date Value Ref Range Status   09/11/2020 305 150 - 440 10*9/L Final     Neutrophils %   Date Value Ref Range Status   06/05/2020 43.1 % Final     Lymphocytes %   Date Value Ref Range Status   06/05/2020 42.3 % Final     Monocytes %   Date Value Ref Range Status   06/05/2020 11.4 % Final     Eosinophils %   Date Value Ref Range Status   06/05/2020 2.3 % Final     Basophils %   Date Value Ref Range Status   06/05/2020 0.9 % Final

## 2020-10-24 MED FILL — COSENTYX PEN 300 MG/2 PENS (150 MG/ML) SUBCUTANEOUS: 84 days supply | Qty: 6 | Fill #0 | Status: AC

## 2020-10-24 MED FILL — RASUVO (PF) 15 MG/0.3 ML SUBCUTANEOUS AUTO-INJECTOR: SUBCUTANEOUS | 84 days supply | Qty: 3.6 | Fill #1

## 2020-10-24 MED FILL — ESCITALOPRAM 10 MG TABLET: 90 days supply | Qty: 90 | Fill #1 | Status: AC

## 2020-10-24 MED FILL — LEUCOVORIN CALCIUM 5 MG TABLET: 84 days supply | Qty: 36 | Fill #3 | Status: AC

## 2020-10-24 MED FILL — LEUCOVORIN CALCIUM 5 MG TABLET: ORAL | 84 days supply | Qty: 36 | Fill #3

## 2020-10-24 MED FILL — RASUVO (PF) 15 MG/0.3 ML SUBCUTANEOUS AUTO-INJECTOR: 84 days supply | Qty: 4 | Fill #1 | Status: AC

## 2020-10-24 MED FILL — ESCITALOPRAM 10 MG TABLET: ORAL | 90 days supply | Qty: 90 | Fill #1

## 2021-01-08 DIAGNOSIS — L4050 Arthropathic psoriasis, unspecified: Principal | ICD-10-CM

## 2021-01-08 DIAGNOSIS — L409 Psoriasis, unspecified: Principal | ICD-10-CM

## 2021-01-08 MED ORDER — RASUVO (PF) 15 MG/0.3 ML SUBCUTANEOUS AUTO-INJECTOR
SUBCUTANEOUS | 1 refills | 84 days | Status: CP
Start: 2021-01-08 — End: ?
  Filled 2021-01-21: qty 3.6, 84d supply, fill #0

## 2021-01-09 DIAGNOSIS — L409 Psoriasis, unspecified: Principal | ICD-10-CM

## 2021-01-09 DIAGNOSIS — L4050 Arthropathic psoriasis, unspecified: Principal | ICD-10-CM

## 2021-01-19 DIAGNOSIS — L4050 Arthropathic psoriasis, unspecified: Principal | ICD-10-CM

## 2021-01-19 MED ORDER — BACLOFEN 10 MG TABLET
ORAL_TABLET | Freq: Three times a day (TID) | ORAL | 3 refills | 90.00000 days | Status: CP | PRN
Start: 2021-01-19 — End: 2022-01-19
  Filled 2021-01-21: qty 270, 90d supply, fill #0

## 2021-01-19 MED ORDER — LEUCOVORIN CALCIUM 5 MG TABLET
ORAL_TABLET | ORAL | 3 refills | 84 days | Status: CP
Start: 2021-01-19 — End: ?
  Filled 2021-01-21: qty 36, 84d supply, fill #0

## 2021-01-19 NOTE — Unmapped (Signed)
[facility] Clinical Assessment & Refill Coordination Note    [name], DOB: 04-Jan-1966  Phone: [phone] (home)     All above HIPAA information was verified with patient.     Was a Nurse, learning disability used for this call? No    Specialty Medication(s):   Inflammatory Disorders: Cosentyx and Rasuvo     Current Outpatient Medications   Medication Sig Dispense Refill   ??? acetaminophen (TYLENOL) 500 MG tablet Take 1,000 mg by mouth.     ??? alcohol swabs (ALCOHOL PADS) PadM Use as directed to prepare injection sites 100 each PRN   ??? clindamycin (CLEOCIN T) 1 % lotion Apply topically to affected bumps two (2) times a  until resolved. 60 mL 5   ??? escitalopram oxalate (LEXAPRO) 10 MG tablet Take 1 tablet (10 mg total) by mouth daily. 90 tablet 3   ??? gabapentin (NEURONTIN) 300 MG capsule TAKE 1 CAPSULE (300 MG TOTAL) BY MOUTH NIGHTLY. 90 capsule 3   ??? leucovorin (WELLCOVORIN) 5 mg tablet Take 3 tablets (15 mg total) by mouth every seven (7) days. Take 12-24 hours after methotrexate. 36 tablet 3   ??? loratadine (CLARITIN) 10 mg tablet Take 1 tablet (10 mg total) by mouth once as needed for allergies. 90 tablet 3   ??? menthol/camphor (CAMPHOR-MENTHOL) 4-10 % Crea Apply topically.     ??? methotrexate, PF, (RASUVO, PF,) 15 mg/0.3 mL AtIn Inject the contents of 1 syringe (15 mg) under the skin every 7 days 3.6 mL 1   ??? phosphorated carbohydrate (EMETROL) solution Take 15 mL by mouth once as needed for nausea.     ??? secukinumab (COSENTYX PEN, 2 PENS,) 150 mg/mL PnIj injection Inject the contents of 2 pens (300 mg) under the skin every twenty-eight (28) days. 6 mL 3     No current facility-administered medications for this visit.        Changes to medications: Archie reports no changes at this time.    Allergies   Allergen Reactions   ??? Prednisone Hives   ??? Sulfa (Sulfonamide Antibiotics) Hives     Other reaction(s): HIVES  Other reaction(s): HIVES   ??? Sulfasalazine      Other reaction(s): HIVES   ??? Enbrel [Etanercept] Rash       Changes to allergies: No    SPECIALTY MEDICATION ADHERENCE     Cosentyx 150mg /ml: 0 days of medicine on hand   Rasuvo 15mg /0.27ml: 0 days of medicine on hand     Medication Adherence    Patient reported X missed doses in the last month: 0  Specialty Medication: Cosentyx 150mg /ml  Patient is on additional specialty medications: Yes  Additional Specialty Medications: Rasuvo 15mg /0.27ml  Patient Reported Additional Medication X Missed Doses in the Last Month: 0          Specialty medication(s) dose(s) confirmed: Regimen is correct and unchanged.     Are there any concerns with adherence? No    Adherence counseling provided? Not needed    CLINICAL MANAGEMENT AND INTERVENTION      Clinical Benefit Assessment:    Do you feel the medicine is effective or helping your condition? Yes    Clinical Benefit counseling provided? Not needed    Adverse Effects Assessment:    Are you experiencing any side effects? No    Are you experiencing difficulty administering your medicine? No    Quality of Life Assessment:    Rheumatology:   Quality of Life  On a scale of 1 ??? 10 with 1 representing not at all and 10 representing completely ??? how has your rheumatologic condition affected your:  Daily pain level?: decline to answer  Ability to complete your regular daily tasks (prepare meals, get dressed, etc.)?: decline to answer  Ability to participate in social or family activities?: decline to answer         Have you discussed this with your provider? Not needed    Therapy Appropriateness:    Is therapy appropriate? Yes, therapy is appropriate and should be continued    DISEASE/MEDICATION-SPECIFIC INFORMATION      For patients on injectable medications: Patient currently has 0 Cosentyx doses left and next injection is scheduled for 01/22/2021.   Patient currently has 0 Rasuvo doses left and next injection is scheduled for 01/22/2021.    PATIENT SPECIFIC NEEDS     - Does the patient have any physical, cognitive, or cultural barriers? No    - Is the patient high risk? No    - Does the patient require a Care Management Plan? No     - Does the patient require physician intervention or other additional services (i.e. nutrition, smoking cessation, social work)? No      SHIPPING     Specialty Medication(s) to be Shipped:   Inflammatory Disorders: Cosentyx 150mg /ml and Rasuvo 15mg /0.56ml    Other medication(s) to be shipped: No additional medications requested for fill at this time     Changes to insurance: No    Delivery Scheduled: Yes, Expected medication delivery date: 01/21/2021.     Medication will be delivered via Same Day Courier to the confirmed prescription address in [facility].    The patient will receive a drug information handout for each medication shipped and additional FDA Medication Guides as required.  Verified that patient has previously received a Conservation officer, historic buildings.    All of the patient's questions and concerns have been addressed.    Karene Fry Carmeline Kowal   [facility] Specialty Pharmacist

## 2021-01-21 MED FILL — COSENTYX PEN 300 MG/2 PENS (150 MG/ML) SUBCUTANEOUS: SUBCUTANEOUS | 84 days supply | Qty: 6 | Fill #1

## 2021-01-21 MED FILL — ESCITALOPRAM 10 MG TABLET: ORAL | 90 days supply | Qty: 90 | Fill #2

## 2021-02-12 ENCOUNTER — Ambulatory Visit: Admit: 2021-02-12 | Discharge: 2021-02-12 | Payer: MEDICARE

## 2021-02-12 DIAGNOSIS — L409 Psoriasis, unspecified: Principal | ICD-10-CM

## 2021-02-12 DIAGNOSIS — Z79899 Other long term (current) drug therapy: Principal | ICD-10-CM

## 2021-02-12 DIAGNOSIS — L4050 Arthropathic psoriasis, unspecified: Principal | ICD-10-CM

## 2021-02-12 LAB — CBC W/ AUTO DIFF
BASOPHILS ABSOLUTE COUNT: 0.1 10*9/L (ref 0.0–0.1)
BASOPHILS RELATIVE PERCENT: 1.3 %
EOSINOPHILS ABSOLUTE COUNT: 0.1 10*9/L (ref 0.0–0.7)
EOSINOPHILS RELATIVE PERCENT: 1.9 %
HEMATOCRIT: 38.1 % (ref 35.0–44.0)
HEMOGLOBIN: 12.8 g/dL (ref 12.0–15.5)
LYMPHOCYTES ABSOLUTE COUNT: 3 10*9/L (ref 0.7–4.0)
LYMPHOCYTES RELATIVE PERCENT: 52.1 %
MEAN CORPUSCULAR HEMOGLOBIN CONC: 33.5 g/dL (ref 30.0–36.0)
MEAN CORPUSCULAR HEMOGLOBIN: 30 pg (ref 26.0–34.0)
MEAN CORPUSCULAR VOLUME: 89.5 fL (ref 82.0–98.0)
MEAN PLATELET VOLUME: 7.4 fL (ref 7.0–10.0)
MONOCYTES ABSOLUTE COUNT: 0.5 10*9/L (ref 0.1–1.0)
MONOCYTES RELATIVE PERCENT: 8.8 %
NEUTROPHILS ABSOLUTE COUNT: 2 10*9/L (ref 1.7–7.7)
NEUTROPHILS RELATIVE PERCENT: 35.9 %
PLATELET COUNT: 266 10*9/L (ref 150–450)
RED BLOOD CELL COUNT: 4.25 10*12/L (ref 3.90–5.03)
RED CELL DISTRIBUTION WIDTH: 13.7 % (ref 12.0–15.0)
WBC ADJUSTED: 5.7 10*9/L (ref 3.5–10.5)

## 2021-02-12 LAB — CREATININE
CREATININE: 0.83 mg/dL — ABNORMAL HIGH
EGFR CKD-EPI AA FEMALE: >90 mL/min/{1.73_m2} (ref >=60)
EGFR CKD-EPI NON-AA FEMALE: 81 mL/min/{1.73_m2} (ref >=60)

## 2021-02-12 LAB — ALT: ALT (SGPT): 9 U/L — ABNORMAL LOW (ref 10–49)

## 2021-02-12 LAB — AST: AST (SGOT): 26 U/L (ref <=34)

## 2021-02-12 MED ORDER — LEUCOVORIN CALCIUM 5 MG TABLET
ORAL_TABLET | ORAL | 3 refills | 84 days | Status: CP
Start: 2021-02-12 — End: ?

## 2021-02-12 MED ORDER — RASUVO (PF) 20 MG/0.4 ML SUBCUTANEOUS AUTO-INJECTOR
SUBCUTANEOUS | 11 refills | 0.00000 days | Status: CP
Start: 2021-02-12 — End: ?
  Filled 2021-02-18: qty 1.6, 28d supply, fill #0

## 2021-02-12 NOTE — Unmapped (Addendum)
Today, we plan to increase your Rasuvo to 20 mg once a week and increase leucovorin as well to 20 mg once a week. Continue Cosentyx.    Monitoring labs needed today.    Recommend you get #4 COVID-19 vaccine shot.     You would be a candidate for Evusheld but you cannot receive this earlier than two weeks after the #4 Vaccine Shot. Evusheld is being given under FDA emergency use action and is still in early clinical trials.      Patient Education     Fact Sheet for Patients, Parents And Caregivers   Emergency Use Authorization (EUA) of EVUSHELD???   (tixagevimab co-packaged with cilgavimab)   for Coronavirus Disease 2019 (COVID-19)    You are being given this Fact Sheet because your healthcare provider believes it is necessary to provide you with EVUSHELD (tixagevimab co-packaged with cilgavimab) for pre-exposure prophylaxis for prevention of coronavirus disease 2019 (COVID-19) caused by the SARS-CoV-2 virus.    This Fact Sheet contains information to help you understand the potential risks and potential benefits of taking EVUSHELD, which you have received or may receive.    The U.S. Food and Drug Administration (FDA) has issued an Emergency Use Authorization (EUA) to make EVUSHELD available during the COVID-19 pandemic (for more details about an EUA please see What is an Emergency Use Authorization? at the end of this document). EVUSHELD is not an FDA-approved medicine in the [place].    Read this Fact Sheet for information about EVUSHELD. Talk to your healthcare provider if you have any questions. It is your choice to receive or not receive EVUSHELD.    What is COVID-19?  COVID-19 is caused by a virus called a coronavirus. You can get COVID-19 through close contact with another person who has the virus.    COVID-19 illnesses have ranged from very mild (including some with no reported symptoms) to severe, including illness resulting in death. While information so far suggests that most COVID-19 illness is mild, serious illness can happen and may cause some of your other medical conditions to become worse. Older people and people of all ages with severe, long-lasting (chronic) medical conditions like heart disease, lung disease, and diabetes, for example, seem to be at higher risk of being hospitalized for COVID-19.    What is EVUSHELD (tixagevimab co-packaged with cilgavimab)?  EVUSHELD is an investigational medicine used in adults and adolescents (54 years of age and older who weigh at least 88 pounds [40 kg]) for pre-exposure prophylaxis for prevention of COVID-19 in persons who are:  ?? not currently infected with SARS-CoV-2 and who have not had recent known close contact with someone who is infected with SARS-CoV-2 and  ?? Who have moderate to severe immune compromise due to a medical condition or have received immunosuppressive medicines or treatments and may not mount an adequate immune response to COVID-19 vaccination or  ?? For whom vaccination with any available COVID-19 vaccine, according to the approved or authorized schedule, is not recommended due to a history of severe adverse reaction (such as severe allergic reaction) to a COVID-19 vaccine(s) or COVID-19 vaccine ingredient(s).    EVUSHELD is investigational because it is still being studied. There is limited information known about the safety and effectiveness of using EVUSHELD for pre-exposure prophylaxis for prevention of COVID-19. EVUSHELD is not authorized for post-exposure prophylaxis for prevention of COVID-19.    The FDA has authorized the emergency use of EVUSHELD for pre-exposure prophylaxis for prevention of COVID-19 under an  Emergency Use Authorization (EUA).    What should I tell my healthcare provider before I receive EVUSHELD?  Tell your healthcare provider if you:  ?? Have any allergies  ?? Have low numbers of blood platelets (which help blood clotting), a bleeding disorder, or are taking anticoagulants (to prevent blood clots)  ?? Have had a heart attack or stroke, have other heart problems, or are at high-risk of cardiac (heart) events  ?? Are pregnant or plan to become pregnant  ?? Are breastfeeding a child  ?? Have any serious illness  ?? Are taking any medications (prescription, over-the-counter, vitamins, or herbal products)     How will I receive EVUSHELD?  ?? EVUSHELD consists of two investigational medicines, tixagevimab and cilgavimab.  ?? You will receive 1 dose of EVUSHELD, consisting of 2 separate injections (tixagevimab and cilgavimab).  ?? EVUSHELD will be given to you by your healthcare provider as 2 intramuscular injections. They are usually, given one after the other, 1 into each of your buttocks.    After the initial dose, if your healthcare provider determines that you need to receive additional doses of EVUSHELD for ongoing protection, the additional doses would be administered once every 6 months.    Who should generally not take EVUSHELD?  Do not take EVUSHELD if you have had a severe allergic reaction to EVUSHELD or any ingredient in EVUSHELD.    What are the important possible side effects of EVUSHELD?  Possible side effects of EVUSHELD are:  ?? Allergic reactions. Allergic reactions can happen during and after injection of EVUSHELD. Tell your healthcare provider right away if you get any of the following signs and symptoms of allergic reactions: fever, chills, nausea, headache, shortness of breath, low or high blood pressure, rapid or slow heart rate, chest discomfort or pain, weakness, confusion, feeling tired, wheezing, swelling of your lips, face, or throat, rash including hives, itching, muscle aches, dizziness and sweating. These reactions may be severe or life threatening.  ?? Cardiac (heart) events: Serious cardiac adverse events have happened, but were not common, in people who received EVUSHELD and also in people who did not receive EVUSHELD in the clinical trial studying pre-exposure prophylaxis for prevention of COVID-19. In people with risk factors for cardiac events (including a history of heart attack), more people who received EVUSHELD experienced serious cardiac events than people who did not receive EVUSHELD. It is not known if these events are related to [facility] or underlying medical conditions. Contact your healthcare provider or get medical help right away if you get any symptoms of cardiac events, including pain, pressure, or discomfort in the chest, arms, neck, back, stomach or jaw, as well as shortness of breath, feeling tired or weak (fatigue), feeling sick (nausea), or swelling in your ankles or lower legs.    The side effects of getting any medicine by intramuscular injection may include pain, bruising of the skin, soreness, swelling, and possible bleeding or infection at the injection site.    These are not all the possible side effects of EVUSHELD. Not a lot of people have been given EVUSHELD. Serious and unexpected side effects may happen. EVUSHELD is still being studied so it is possible that all of the risks are not known at this time.    It is possible that EVUSHELD may reduce your body's immune response to a COVID-19 vaccine. If you have received a COVID-19 vaccine, you should wait to receive EVUSHELD until at least 2 weeks after COVID-19 vaccination.  What other prevention choices are there?  Vaccines to prevent COVID-19 are approved or available under Emergency Use Authorization. Use of EVUSHELD does not replace vaccination against COVID-19. For more information about other medicines authorized for treatment or prevention of COVID-19 go to [email or web address] for more information.    It is your choice to receive or not receive EVUSHELD. Should you decide not to receive EVUSHELD, it will not change your standard medical care.    EVUSHELD is not authorized for post-exposure prophylaxis of COVID-19.    What if I am pregnant or breastfeeding?  If you are pregnant or breastfeeding, discuss your options and specific situation with your healthcare provider.    How do I report side effects with EVUSHELD?  Contact your healthcare provider if you have any side effects that bother you or do not go away. Report side effects to FDA MedWatch at [employer] or call 1-800-FDA-1088 or call [employer] at [phone].    Additional Information  If you have questions, visit the website or call the telephone number provided below.    Website  Telephone number    [email or web address]  [phone]      How can I learn more about COVID-19?  ?? Ask your healthcare provider.  ?? Visit: [email or web address]    ?? Contact your local or state public health department.    What is an Emergency Use Authorization?  The [place] FDA has made EVUSHELD (tixagevimab co-packaged with cilgavimab) available under an emergency access mechanism called an Emergency Use Authorization EUA. The EUA is supported by a [employer]or, [employer] and Human Service (HHS) declaration that circumstances exist to justify the emergency use of drugs and biological products during the COVID-19 pandemic.    EVUSHELD for pre-exposure prophylaxis for prevention of coronavirus disease 2019 (COVID-19) caused by the SARS-CoV-2 virus has not undergone the same type of review as an FDA-approved product. In issuing an EUA under the COVID-19 public health emergency, the FDA has determined, among other things, that based on the total amount of scientific evidence available including data from adequate and well-controlled clinical trials, if available, it is reasonable to believe that the product may be effective for diagnosing, treating, or preventing COVID-19, or a serious or life-threatening disease or condition caused by COVID-19; that the known and potential benefits of the product, when used to diagnose, treat, or prevent such disease or condition, outweigh the known and potential risks of such product; and that there are no adequate, approved and available alternatives.    All of these criteria must be met to allow for the product to be used in the treatment of patients during the COVID-19 pandemic. The EUA for EVUSHELD is in effect for the duration of the COVID-19 declaration justifying emergency use of EVUSHELD,  unless terminated or revoked (after which EVUSHELD may no longer be used under the EUA).    Distributed by: [employer], [place], [place] [address]    Manufactured by: [employer], [address], [place], [place] [address], [place]    ??[employer] 2021. All rights reserved.     Patient Education   Frequently Asked Questions on the Emergency Use Authorization for Evusheld (tixagevimab co-packaged with cilgavimab) for Pre-exposure Prophylaxis (PrEP) of COVID-19     Q. What is an Emergency Use Authorization (EUA)?   A: Under section 564 of the [employer], Drug & Cosmetic Act, after a declaration by the [facility] Secretary based on one of four types of determinations, FDA may authorize an unapproved product or unapproved  uses of an approved product for emergency use. In issuing an EUA, FDA must determine, among other things, that based on the totality of scientific evidence available to the Agency, including data from adequate and well-controlled clinical trials, if available, it is reasonable to believe that the product may be effective in diagnosing, treating, or preventing a serious or life-threatening disease or condition caused by a chemical, biological, radiological, or nuclear agent; that the known and potential benefits, when used to treat, diagnose or prevent such disease or condition, outweigh the known and potential risks for the product; and that there are no adequate, approved, and available alternatives. Emergency use authorization is NOT the same as FDA approval or licensure.     Q. What does this EUA authorize? What are the limitations of authorized use?   A: The EUA authorizes [employer]???s Evusheld (tixagevimab co-packaged with cilgavimab) for emergency use as pre-exposure prophylaxis for prevention of COVID-19 in adults and pediatric individuals (25 years of age and older weighing at least 40 kg):     ??? Who are not currently infected with SARS-CoV-2 and who have not had a known recent exposure to an individual infected with SARS-CoV-2 and  o Who have moderate to severe immune compromise due to a medical condition or receipt of immunosuppressive medications or treatments and may not mount an adequate immune response to COVID-19 vaccination or  o For whom vaccination with any available COVID-19 vaccine, according to the approved or authorized schedule, is not recommended due to a history of severe adverse reaction (e.g., severe allergic reaction) to a COVID-19 vaccine(s) and/or COVID-19 vaccine component(s).     Limitations of Authorized Use     ??? Evusheld is not authorized for use in individuals:   o For treatment of COVID-19, or   o For post-exposure prophylaxis of COVID-19 in individuals who have been exposed to someone infected with SARS-CoV-2.   ??? Pre-exposure prophylaxis with Evusheld is not a substitute for vaccination in individuals for whom COVID-19 vaccination is recommended. Individuals for whom COVID-19 vaccination is recommended, including individuals with moderate to severe immune compromise who may derive benefit from COVID-19 vaccination, should receive COVID-19 vaccination.   ??? In individuals who have received a COVID-19 vaccine, Evusheld should be administered at least two weeks after vaccination.     Q. What are some medical conditions or treatments that may lead to an inadequate immune response to the COVID-19 vaccination?   A: Medical conditions or treatments that may result in moderate to severe immunocompromise and an inadequate immune response to COVID-19 vaccination include but are not limited to: ??? Active treatment for solid tumor and hematologic malignancies   ??? Receipt of solid-organ transplant and taking immunosuppressive therapy   ??? Receipt of chimeric antigen receptor (CAR)-T-cell or hematopoietic stem cell transplant (within 2 years of transplantation or taking immunosuppression therapy)   ??? Moderate or severe primary immunodeficiency (e.g., DiGeorge syndrome, Wiskott-Aldrich syndrome)   ??? Advanced or untreated HIV infection (people with HIV and CD4 cell counts)  ??? Active treatment with high-dose corticosteroids (i.e., ?20 mg prednisone or equivalent per day when administered for ?2 weeks), alkylating agents, antimetabolites, transplant-related immunosuppressive drugs, cancer chemotherapeutic agents classified as severely immunosuppressive, tumor-necrosis (TNF) blockers, and other biologic agents that are immunosuppressive or immunomodulatory (e.g., B-cell depleting agents)     For additional information, refer to the [facility] Vaccines & Immunizations website.     Q. Is Evusheld approved by the FDA to prevent or treat COVID-19?   A. No.  Evusheld is not FDA-approved to prevent or treat any diseases or conditions, including COVID-19. Evusheld is an investigational drug.     Q. How can Evusheld be obtained for use under the EUA?   A. For questions on how to obtain Evusheld, please contact COVID19therapeutics@hhs .gov.     Q. Who may prescribe Evusheld under the EUA?   A. Evusheld may only be prescribed for an individual patient by physicians, advanced practice registered nurses, and physician assistants that are licensed or authorized under state law to prescribe drugs in the therapeutic class to which Evusheld belongs (i.e., anti-infectives).     Q. Are tixagevimab and cilgavimab monoclonal antibodies? What is a monoclonal antibody?   A. Yes, tixagevimab and cilgavimab are monoclonal antibodies. Monoclonal antibodies are laboratory-produced molecules engineered to serve as substitute antibodies that can restore, enhance or mimic the immune system's attack on pathogens. Evusheld is designed to block viral attachment and entry into human cells, thus neutralizing the virus.     Q. When should Evusheld be administered to a patient?   A. Patients should talk to their health care provider to determine whether, based on their individual circumstances, they are eligible to receive Evusheld, and when it should be administered. More information about administration is available in the Fact Sheet for Health Care Providers.     Q. Are there potential side effects of Evusheld?   A. Possible side effects of Evusheld include the following:     Allergic reactions can happen during and after injection of Evusheld. Reactions to Evusheld may include difficulty breathing or swallowing; shortness of breath; wheezing; swelling of the face, lips, tongue or throat; rash including hives; or itching.     The side effects of getting any medicine by intramuscular injection may include pain, bruising of the skin, soreness, swelling, and possible bleeding or infection at the injection site.     Serious cardiac adverse events (such as myocardial infarction and heart failure) were infrequent in the clinical trial evaluating Evusheld for pre-exposure prophylaxis for prevention. However, more trial participants had serious cardiac adverse events after receiving Evusheld compared to placebo. These participants all had risk factors for cardiac disease or a history of cardiovascular disease before participating in the clinical trial. It is not clear if Evusheld caused these cardiac adverse events.     These are not all the possible side effects of Evusheld. Not a lot of people have been given Evusheld. Serious and unexpected side effects may happen. Evusheld is still being studied so it is possible that all of the risks are not known at this time.     Q. Are there reporting requirements for health care facilities and providers as part of the EUA?   A. Yes. As part of the EUA, FDA requires health care providers who prescribe Evusheld to report all medication errors and serious adverse events considered to be potentially related to Evusheld through FDA???s MedWatch Adverse Event Reporting program.     Health care facilities and providers must report therapeutics information and utilization data as directed by the U.S. Department of Health and [employer].     Q. Do patient outcomes need to be reported under the EUA?   A. No, reporting of patient outcomes is not required under the EUA. However, reporting of all medication errors and serious adverse events considered to be potentially related to Evusheld occurring during treatment is required.     Q. FDA has issued a number of EUAs including for therapeutics. If state laws  impose different or additional requirements on the medical product covered by an EUA, are those state laws preempted?   A. As stated in FDA???s Emergency Use Authorization of Medical Products and Related Authorities Guidance, ???FDA believes that the terms and conditions of an EUA issued under section 564 preempt state or local law, both legislative requirements and common-law duties, that impose different or additional requirements on the medical product for which the EUA was issued in the context of the emergency declared under section 564.??? The guidance explains the basis for FDA???s views on this subject.     Q. Can health care providers share the patient/caregiver Fact Sheet electronically?   A. The letter of authorization for Evusheld, requires that Fact Sheets be made available to health care providers and to patients/caregivers ???through appropriate means.??? Electronic delivery of the patient/caregiver Fact Sheet is an appropriate means. For example, when the patient requests the Fact       Sheet electronically, it can be delivered as a PDF prior to medication administration. Health care providers should confirm receipt of the Fact Sheet with the patient.     Q. Can I receive a COVID-19 vaccine if I was treated with a monoclonal antibody for COVID-19?   A. Patients and health care providers should refer to recommendations of the Advisory Committee on Immunization Practices regarding vaccination.     Q. Can I receive Evusheld if I recently received a COVID-19 vaccine?   A. Evusheld may reduce your body???s immune response to a COVID-19 vaccine. If you receive a COVID-19 vaccine, you should wait to receive Evusheld until at least two weeks after your COVID-19 vaccination.     Q. Are there data showing Evusheld may provide benefit for pre-exposure prophylaxis for prevention of COVID-19 in certain patients?   A. Yes. The most important scientific evidence supporting the authorization of Evusheld is from [employer], a randomized, double-blind, placebo-controlled clinical trial in adults who had not received a COVID-19 vaccine and did not have a history of SARS-CoV-2 infection or test positive for SARS-CoV-2 infection at the start of the trial. All trial participants were either ?54 years of age, had a pre-specified co-morbidity (obesity, congestive heart failure, chronic obstructive pulmonary disease, chronic kidney disease, chronic liver disease, immunocompromised state, or previous history of severe or serious adverse event after receiving any approved vaccine), or were at increased risk of SARS-CoV-2 infection due to their living situation or occupation.     The main outcome measured in the trial was whether the trial participant had a case of documented COVID-19 after receiving Evusheld or placebo and before day 183 of the trial. In this trial, 3,441 people received Evusheld and 1,731 received a placebo. In the primary analysis, Evusheld recipients saw a 77% reduced risk of developing COVID-19 compared to those who received a placebo, a statistically significant difference. In additional analyses, the reduction in risk of developing COVID-19 was maintained for Evusheld recipients through six months. The safety and effectiveness of this investigational therapy for use in the pre-exposure prevention of COVID-19 continue to be evaluated.

## 2021-02-12 NOTE — Unmapped (Signed)
Patient Name: Mary Waters  PCP: ??Noralyn Pick, FNP  Source of History: patient and records  Date of Visit: 02/12/21 3:11 PM    Chief Compliant: follow-up for PsA    PRIOR RHEUMATOLOGIC HISTORY:?? Psoriatis arthritis.   Established care in September 2016.  Ultrasound in 2016 showing small PIP joint effusions, presence of enthesophytes, and achilles tendon inflammation/tendinitis consistent with an underlying spondyloarthropathy such as psoriatic arthritis.    Humira 2016-06/2016 with loss of efficacy  Methotrexate added 01/2016 for persistent peripheral arthritis; switched to auto-injector 07/2019  Enbrel 06/2016-09/2017 with loss of efficacy  Cimzia 09/2017 to 07/2019.   Cosentyx  07/2019 -     Never pursued surgical decompression for cervical myelopathy. Has numbness and paresthesias in both hands.     HPI: Mary Waters is a 55 y.o. female who presents for her follow-up for psoriatic arthritis with known cervical myelopathy. Last seen by me in person in June 2021 with concerns for decline in Schober but because she noted improvement on Cosentyx continued therapy unchanged. Saw Carlus Pavlov [facility] in October 2021 and reported good disease control. She presents to clinic in person today.    Today, patient reports she is doing well. She reports compliance with Cosentyx 300 mg once a month, Rasuvo 15 mg weekly and leucovorin 15 mg once a week after the Rasuvo. No active psoriasis. She continues to endorse neck and back stiffness. She also notes increased pain and stiffness in her right 3rd PIP and left 2nd PIP. She also has more difficulty making tight fist on the right.  We discussed increasing her Rasuvo and leucovorin to address her joints.     ROS??: Attests to the above, otherwise, review of all other systems is negative.   ????  Past Medical and Surgical History:  ??  Patient Active Problem List    Diagnosis Date Noted   ??? Psoriatic arthritis (CMS-HCC) 01/07/2016   ??? Menopause 08/07/2015   ??? History of tobacco use 10/23/2014   ??? Fibromyalgia 04/01/2014   ??? Carpal tunnel syndrome of right wrist 04/01/2014   ??? GERD (gastroesophageal reflux disease) 04/01/2014   ??? Chronic pain syndrome 04/01/2014   ??? Morbid obesity due to excess calories (CMS-HCC) 04/01/2014   ??? Psoriasis 04/01/2014   ??? Allergic rhinitis 02/04/2014   ??? Cataracts, both eyes 10/03/2013   ??? Health care maintenance 12/23/2010   ??? IBS (irritable bowel syndrome) 12/20/2008     Past Surgical History:   Procedure Laterality Date   ??? CESAREAN SECTION     ??? DILATION AND CURETTAGE OF UTERUS     ??? HYSTERECTOMY  2001    TAH   ??? KNEE SURGERY     ??? TOTAL ABDOMINAL HYSTERECTOMY       Allergies:   ??  Allergies   Allergen Reactions   ??? Prednisone Hives   ??? Sulfa (Sulfonamide Antibiotics) Hives     Other reaction(s): HIVES  Other reaction(s): HIVES   ??? Sulfasalazine      Other reaction(s): HIVES   ??? Enbrel [Etanercept] Rash     Current Outpatient Medications:  ??  Current Outpatient Medications on File Prior to Visit   Medication Sig Dispense Refill   ??? acetaminophen (TYLENOL) 500 MG tablet Take 1,000 mg by mouth.     ??? alcohol swabs (ALCOHOL PADS) PadM Use as directed to prepare injection sites 100 each PRN   ??? baclofen (LIORESAL) 10 MG tablet Take 1 tablet (10 mg total) by mouth Three (3)  times a day as needed for muscle spasms. 270 tablet 3   ??? clindamycin (CLEOCIN T) 1 % lotion Apply topically to affected bumps two (2) times a  until resolved. 60 mL 5   ??? escitalopram oxalate (LEXAPRO) 10 MG tablet Take 1 tablet (10 mg total) by mouth daily. 90 tablet 3   ??? leucovorin (WELLCOVORIN) 5 mg tablet Take 3 tablets (15 mg total) by mouth every seven (7) days. Take 12-24 hours after methotrexate. 36 tablet 3   ??? loratadine (CLARITIN) 10 mg tablet Take 1 tablet (10 mg total) by mouth once as needed for allergies. 90 tablet 3   ??? menthol/camphor (CAMPHOR-MENTHOL) 4-10 % Crea Apply topically.     ??? methotrexate, PF, (RASUVO, PF,) 15 mg/0.3 mL AtIn Inject the contents of 1 syringe (15 mg) under the skin every 7 days 3.6 mL 1   ??? phosphorated carbohydrate (EMETROL) solution Take 15 mL by mouth once as needed for nausea.     ??? secukinumab (COSENTYX PEN, 2 PENS,) 150 mg/mL PnIj injection Inject the contents of 2 pens (300 mg) under the skin every twenty-eight (28) days. 6 mL 3   ??? gabapentin (NEURONTIN) 300 MG capsule TAKE 1 CAPSULE (300 MG TOTAL) BY MOUTH NIGHTLY. 90 capsule 3     No current facility-administered medications on file prior to visit.   ????  ??  Immunization History   Administered Date(s) Administered   ??? COVID-19 VACC,MRNA,(PFIZER)(PF)(IM) 03/02/2020, 03/22/2020, 09/16/2020   ??? Influenza Vaccine Quad (IIV4 PF) 41mo+ injectable 09/26/2015, 10/14/2016, 12/02/2017, 11/07/2018, 10/03/2019, 10/08/2020   ??? PNEUMOCOCCAL POLYSACCHARIDE 23 03/04/2016   ??? PPD Test 09/12/2013   ??? Pneumococcal Conjugate 13-Valent 10/23/2015   ??? Rubella 01/10/1979   ??? TdaP 09/12/2013     ????  PHYSICAL EXAM??  Vital signs: BP 132/77 (BP Site: L Arm, BP Position: Sitting, BP Cuff Size: Large)  - Pulse 92  - Temp 36.2 ??C (97.1 ??F) (Temporal)  - Wt 98.1 kg (216 lb 3.2 oz)  - BMI 32.88 kg/m?? Body mass index is 32.88 kg/m??.  Gen: Well-developed, well-nourished adult in no apparent distress. Normocephalic with no external signs of trauma. Pleasant and cooperative. AOx4.?  HEENT: PERRLA, EOMI, mask in place  Lungs: Broad chest excursion with good air movement. CTAB without wheezing/rhonchi/rales. ????  CV: RRR, Normal S1/S2, No murmurs/rubs/gallops heard.  PV: Warm, 2+ radial and pedal pulses, no C/C/E.????  Neuro: Good comprehension/cognition. CN 2-12 intact.  Muscle strength 5/5 in all extremities but with reduced grip strength on right. Gait normal.????  Comprehensive Musculoskeletal Examination:????  ?? Jaw, neck without limited ROM.????  ?? Shoulders, elbows, wrists, hands, fingers: Mild swelling and tenderness at right 2nd and 3rd PIP as well as left 2nd PIP. Unable to make tight fist on the right. Wrists, elbows and shoulders wnl  ?? Occiput to wall 0 cm, chest wall expansion 5 cm, Modified Schober 15 cm to 17 cm, unchanged from prior with me in June 2021.   ?? Bilateral hips with good ROM  ?? Knee, ankles, feet, toes: No deformity, erythema, warmth, swelling, effusion, tenderness, limited ROM. Knee stable to valgus/varus stress and anterior/posterior drawer sign.????  Skin: No psoriasis noted    LABORATORY-monitoring labs obtained during visit for medication monitoring with no signs for toxicity  Recent Results (from the past 168 hour(s))   Creatinine    Collection Time: 02/12/21  3:52 PM   Result Value Ref Range    Creatinine 0.83 (H) 0.60 - 0.80 mg/dL  EGFR CKD-EPI Non-African American, Female 51 >=60 mL/min/1.20m2    EGFR CKD-EPI African American, Female >90 >=60 mL/min/1.47m2   AST    Collection Time: 02/12/21  3:52 PM   Result Value Ref Range    AST 26 <=34 U/L   ALT    Collection Time: 02/12/21  3:52 PM   Result Value Ref Range    ALT 9 (L) 10 - 49 U/L   CBC w/ Differential    Collection Time: 02/12/21  3:52 PM   Result Value Ref Range    WBC 5.7 3.5 - 10.5 10*9/L    RBC 4.25 3.90 - 5.03 10*12/L    HGB 12.8 12.0 - 15.5 g/dL    HCT 16.1 09.6 - 04.5 %    MCV 89.5 82.0 - 98.0 fL    MCH 30.0 26.0 - 34.0 pg    MCHC 33.5 30.0 - 36.0 g/dL    RDW 40.9 81.1 - 91.4 %    MPV 7.4 7.0 - 10.0 fL    Platelet 266 150 - 450 10*9/L    Neutrophils % 35.9 %    Lymphocytes % 52.1 %    Monocytes % 8.8 %    Eosinophils % 1.9 %    Basophils % 1.3 %    Absolute Neutrophils 2.0 1.7 - 7.7 10*9/L    Absolute Lymphocytes 3.0 0.7 - 4.0 10*9/L    Absolute Monocytes 0.5 0.1 - 1.0 10*9/L    Absolute Eosinophils 0.1 0.0 - 0.7 10*9/L    Absolute Basophils 0.1 0.0 - 0.1 10*9/L       ????IMAGING???? - no recent    ????GENERAL SUMMARY AND IMPRESSION: ????  ????  In summary, the patient is a 55 y.o. female here for follow-up for psoriatic arthritis on secukinumab 300 mg monthly injections with methotrexate 15 mg subcutaneous weekly injections followed by leucovorin 15 mg once a week. Tolerating regimen with no side effects. Exam today concerning for increasing inflammatory arthritis with mild swelling and tenderness across several PIPs as well as reports of stiffness. Axial exam stable. Given concerns for increasing peripheral inflammatory arthritis, discussed increasing methotrexate/rasuvo dosing. She agrees to increase. Medication monitoring labs completed today with no signs for toxicity. In addition, we discussed that she is considered immunocompromised and therefore recommend COVID-19 vaccine #4 shot for her to receive. She does not want to receive today but will consider. We also discussed she is candidate for Evusheld if she is interested. Recommend getting booster followed by Evusheld no sooner than two weeks after booster. Literature on Evusheld provided. Advised her to contact me if she is interested. Follow-up in 3-4 months with Carlus Pavlov [facility] and 7-8 months with myself or sooner pending lab studies.     RECOMMENDATIONS: ????  ????   Diagnosis ICD-10-CM Associated Orders   1. Psoriatic arthritis (CMS-HCC)  L40.50 CBC w/ Differential     Creatinine     AST     ALT     methotrexate, PF, (RASUVO, PF,) 20 mg/0.4 mL AtIn     leucovorin (WELLCOVORIN) 5 mg tablet   2. Methotrexate, long term, current use  Z79.899 CBC w/ Differential     Creatinine     AST     ALT     methotrexate, PF, (RASUVO, PF,) 20 mg/0.4 mL AtIn     leucovorin (WELLCOVORIN) 5 mg tablet   3. Psoriasis  L40.9      Patient Instructions     Today, we plan to increase your Rasuvo to  20 mg once a week and increase leucovorin as well to 20 mg once a week. Continue Cosentyx.    Monitoring labs needed today.    Recommend you get #4 COVID-19 vaccine shot.     You would be a candidate for Evusheld but you cannot receive this earlier than two weeks after the #4 Vaccine Shot. Evusheld is being given under FDA emergency use action and is still in early clinical trials.      Patient Education     Fact Sheet for Patients, Parents And Caregivers   Emergency Use Authorization (EUA) of EVUSHELD???   (tixagevimab co-packaged with cilgavimab)   for Coronavirus Disease 2019 (COVID-19)    You are being given this Fact Sheet because your healthcare provider believes it is necessary to provide you with EVUSHELD (tixagevimab co-packaged with cilgavimab) for pre-exposure prophylaxis for prevention of coronavirus disease 2019 (COVID-19) caused by the SARS-CoV-2 virus.    This Fact Sheet contains information to help you understand the potential risks and potential benefits of taking EVUSHELD, which you have received or may receive.    The U.S. Food and Drug Administration (FDA) has issued an Emergency Use Authorization (EUA) to make EVUSHELD available during the COVID-19 pandemic (for more details about an EUA please see What is an Emergency Use Authorization? at the end of this document). EVUSHELD is not an FDA-approved medicine in the [place].    Read this Fact Sheet for information about EVUSHELD. Talk to your healthcare provider if you have any questions. It is your choice to receive or not receive EVUSHELD.    What is COVID-19?  COVID-19 is caused by a virus called a coronavirus. You can get COVID-19 through close contact with another person who has the virus.    COVID-19 illnesses have ranged from very mild (including some with no reported symptoms) to severe, including illness resulting in death. While information so far suggests that most COVID-19 illness is mild, serious illness can happen and may cause some of your other medical conditions to become worse. Older people and people of all ages with severe, long-lasting (chronic) medical conditions like heart disease, lung disease, and diabetes, for example, seem to be at higher risk of being hospitalized for COVID-19.    What is EVUSHELD (tixagevimab co-packaged with cilgavimab)?  EVUSHELD is an investigational medicine used in adults and adolescents (77 years of age and older who weigh at least 88 pounds [40 kg]) for pre-exposure prophylaxis for prevention of COVID-19 in persons who are:  ?? not currently infected with SARS-CoV-2 and who have not had recent known close contact with someone who is infected with SARS-CoV-2 and  ?? Who have moderate to severe immune compromise due to a medical condition or have received immunosuppressive medicines or treatments and may not mount an adequate immune response to COVID-19 vaccination or  ?? For whom vaccination with any available COVID-19 vaccine, according to the approved or authorized schedule, is not recommended due to a history of severe adverse reaction (such as severe allergic reaction) to a COVID-19 vaccine(s) or COVID-19 vaccine ingredient(s).    EVUSHELD is investigational because it is still being studied. There is limited information known about the safety and effectiveness of using EVUSHELD for pre-exposure prophylaxis for prevention of COVID-19. EVUSHELD is not authorized for post-exposure prophylaxis for prevention of COVID-19.    The FDA has authorized the emergency use of EVUSHELD for pre-exposure prophylaxis for prevention of COVID-19 under an Emergency Use Authorization (EUA).    What  should I tell my healthcare provider before I receive EVUSHELD?  Tell your healthcare provider if you:  ?? Have any allergies  ?? Have low numbers of blood platelets (which help blood clotting), a bleeding disorder, or are taking anticoagulants (to prevent blood clots)  ?? Have had a heart attack or stroke, have other heart problems, or are at high-risk of cardiac (heart) events  ?? Are pregnant or plan to become pregnant  ?? Are breastfeeding a child  ?? Have any serious illness  ?? Are taking any medications (prescription, over-the-counter, vitamins, or herbal products)     How will I receive EVUSHELD?  ?? EVUSHELD consists of two investigational medicines, tixagevimab and cilgavimab.  ?? You will receive 1 dose of EVUSHELD, consisting of 2 separate injections (tixagevimab and cilgavimab).  ?? EVUSHELD will be given to you by your healthcare provider as 2 intramuscular injections. They are usually, given one after the other, 1 into each of your buttocks.    After the initial dose, if your healthcare provider determines that you need to receive additional doses of EVUSHELD for ongoing protection, the additional doses would be administered once every 6 months.    Who should generally not take EVUSHELD?  Do not take EVUSHELD if you have had a severe allergic reaction to EVUSHELD or any ingredient in EVUSHELD.    What are the important possible side effects of EVUSHELD?  Possible side effects of EVUSHELD are:  ?? Allergic reactions. Allergic reactions can happen during and after injection of EVUSHELD. Tell your healthcare provider right away if you get any of the following signs and symptoms of allergic reactions: fever, chills, nausea, headache, shortness of breath, low or high blood pressure, rapid or slow heart rate, chest discomfort or pain, weakness, confusion, feeling tired, wheezing, swelling of your lips, face, or throat, rash including hives, itching, muscle aches, dizziness and sweating. These reactions may be severe or life threatening.  ?? Cardiac (heart) events: Serious cardiac adverse events have happened, but were not common, in people who received EVUSHELD and also in people who did not receive EVUSHELD in the clinical trial studying pre-exposure prophylaxis for prevention of COVID-19. In people with risk factors for cardiac events (including a history of heart attack), more people who received EVUSHELD experienced serious cardiac events than people who did not receive EVUSHELD. It is not known if these events are related to [place] or underlying medical conditions. Contact your healthcare provider or get medical help right away if you get any symptoms of cardiac events, including pain, pressure, or discomfort in the chest, arms, neck, back, stomach or jaw, as well as shortness of breath, feeling tired or weak (fatigue), feeling sick (nausea), or swelling in your ankles or lower legs.    The side effects of getting any medicine by intramuscular injection may include pain, bruising of the skin, soreness, swelling, and possible bleeding or infection at the injection site.    These are not all the possible side effects of EVUSHELD. Not a lot of people have been given EVUSHELD. Serious and unexpected side effects may happen. EVUSHELD is still being studied so it is possible that all of the risks are not known at this time.    It is possible that EVUSHELD may reduce your body's immune response to a COVID-19 vaccine. If you have received a COVID-19 vaccine, you should wait to receive EVUSHELD until at least 2 weeks after COVID-19 vaccination.    What other prevention choices are there?  Vaccines  to prevent COVID-19 are approved or available under Emergency Use Authorization. Use of EVUSHELD does not replace vaccination against COVID-19. For more information about other medicines authorized for treatment or prevention of COVID-19 go to [email or web address] for more information.    It is your choice to receive or not receive EVUSHELD. Should you decide not to receive EVUSHELD, it will not change your standard medical care.    EVUSHELD is not authorized for post-exposure prophylaxis of COVID-19.    What if I am pregnant or breastfeeding?  If you are pregnant or breastfeeding, discuss your options and specific situation with your healthcare provider.    How do I report side effects with EVUSHELD?  Contact your healthcare provider if you have any side effects that bother you or do not go away. Report side effects to FDA MedWatch at [employer] or call 1-800-FDA-1088 or call [employer] at ([phone].    Additional Information  If you have questions, visit the website or call the telephone number provided below.    Website  Telephone number    http://www.evusheld.com  [phone]      How can I learn more about COVID-19?  ?? Ask your healthcare provider.  ?? Visit: [email or web address]    ?? Contact your local or state public health department.    What is an Emergency Use Authorization?  The [place] FDA has made EVUSHELD (tixagevimab co-packaged with cilgavimab) available under an emergency access mechanism called an Emergency Use Authorization EUA. The EUA is supported by a Surveyor, minerals and Human Service (HHS) declaration that circumstances exist to justify the emergency use of drugs and biological products during the COVID-19 pandemic.    EVUSHELD for pre-exposure prophylaxis for prevention of coronavirus disease 2019 (COVID-19) caused by the SARS-CoV-2 virus has not undergone the same type of review as an FDA-approved product. In issuing an EUA under the COVID-19 public health emergency, the FDA has determined, among other things, that based on the total amount of scientific evidence available including data from adequate and well-controlled clinical trials, if available, it is reasonable to believe that the product may be effective for diagnosing, treating, or preventing COVID-19, or a serious or life-threatening disease or condition caused by COVID-19; that the known and potential benefits of the product, when used to diagnose, treat, or prevent such disease or condition, outweigh the known and potential risks of such product; and that there are no adequate, approved and available alternatives.    All of these criteria must be met to allow for the product to be used in the treatment of patients during the COVID-19 pandemic. The EUA for EVUSHELD is in effect for the duration of the COVID-19 declaration justifying emergency use of EVUSHELD,  unless terminated or revoked (after which EVUSHELD may no longer be used under the EUA).    Distributed by: [employer], , [place] [address]    Manufactured by: [employer], [address], [place], [place] [address], [place]    ??[employer] 2021. All rights reserved.     Patient Education   Frequently Asked Questions on the Emergency Use Authorization for Evusheld (tixagevimab co-packaged with cilgavimab) for Pre-exposure Prophylaxis (PrEP) of COVID-19     Q. What is an Emergency Use Authorization (EUA)?   A: Under section 564 of the [employer], Drug & Cosmetic Act, after a declaration by the [facility] Secretary based on one of four types of determinations, FDA may authorize an unapproved product or unapproved uses of an approved product for emergency use.  In issuing an EUA, FDA must determine, among other things, that based on the totality of scientific evidence available to the Agency, including data from adequate and well-controlled clinical trials, if available, it is reasonable to believe that the product may be effective in diagnosing, treating, or preventing a serious or life-threatening disease or condition caused by a chemical, biological, radiological, or nuclear agent; that the known and potential benefits, when used to treat, diagnose or prevent such disease or condition, outweigh the known and potential risks for the product; and that there are no adequate, approved, and available alternatives. Emergency use authorization is NOT the same as FDA approval or licensure.     Q. What does this EUA authorize? What are the limitations of authorized use?   A: The EUA authorizes [employer]???s Evusheld (tixagevimab co-packaged with cilgavimab) for emergency use as pre-exposure prophylaxis for prevention of COVID-19 in adults and pediatric individuals (75 years of age and older weighing at least 40 kg):     ??? Who are not currently infected with SARS-CoV-2 and who have not had a known recent exposure to an individual infected with SARS-CoV-2 and  o Who have moderate to severe immune compromise due to a medical condition or receipt of immunosuppressive medications or treatments and may not mount an adequate immune response to COVID-19 vaccination or  o For whom vaccination with any available COVID-19 vaccine, according to the approved or authorized schedule, is not recommended due to a history of severe adverse reaction (e.g., severe allergic reaction) to a COVID-19 vaccine(s) and/or COVID-19 vaccine component(s).     Limitations of Authorized Use     ??? Evusheld is not authorized for use in individuals:   o For treatment of COVID-19, or   o For post-exposure prophylaxis of COVID-19 in individuals who have been exposed to someone infected with SARS-CoV-2.   ??? Pre-exposure prophylaxis with Evusheld is not a substitute for vaccination in individuals for whom COVID-19 vaccination is recommended. Individuals for whom COVID-19 vaccination is recommended, including individuals with moderate to severe immune compromise who may derive benefit from COVID-19 vaccination, should receive COVID-19 vaccination.   ??? In individuals who have received a COVID-19 vaccine, Evusheld should be administered at least two weeks after vaccination.     Q. What are some medical conditions or treatments that may lead to an inadequate immune response to the COVID-19 vaccination?   A: Medical conditions or treatments that may result in moderate to severe immunocompromise and an inadequate immune response to COVID-19 vaccination include but are not limited to:   ??? Active treatment for solid tumor and hematologic malignancies   ??? Receipt of solid-organ transplant and taking immunosuppressive therapy   ??? Receipt of chimeric antigen receptor (CAR)-T-cell or hematopoietic stem cell transplant (within 2 years of transplantation or taking immunosuppression therapy)   ??? Moderate or severe primary immunodeficiency (e.g., DiGeorge syndrome, Wiskott-Aldrich syndrome)   ??? Advanced or untreated HIV infection (people with HIV and CD4 cell counts)  ??? Active treatment with high-dose corticosteroids (i.e., ?20 mg prednisone or equivalent per day when administered for ?2 weeks), alkylating agents, antimetabolites, transplant-related immunosuppressive drugs, cancer chemotherapeutic agents classified as severely immunosuppressive, tumor-necrosis (TNF) blockers, and other biologic agents that are immunosuppressive or immunomodulatory (e.g., B-cell depleting agents)     For additional information, refer to the [facility] Vaccines & Immunizations website.     Q. Is Evusheld approved by the FDA to prevent or treat COVID-19?   A. No. Evusheld is not FDA-approved to prevent  or treat any diseases or conditions, including COVID-19. Evusheld is an investigational drug.     Q. How can Evusheld be obtained for use under the EUA?   A. For questions on how to obtain Evusheld, please contact COVID19therapeutics@hhs .gov.     Q. Who may prescribe Evusheld under the EUA?   A. Evusheld may only be prescribed for an individual patient by physicians, advanced practice registered nurses, and physician assistants that are licensed or authorized under state law to prescribe drugs in the therapeutic class to which Evusheld belongs (i.e., anti-infectives).     Q. Are tixagevimab and cilgavimab monoclonal antibodies? What is a monoclonal antibody?   A. Yes, tixagevimab and cilgavimab are monoclonal antibodies. Monoclonal antibodies are laboratory-produced molecules engineered to serve as substitute antibodies that can restore, enhance or mimic the immune system's attack on pathogens. Evusheld is designed to block viral attachment and entry into human cells, thus neutralizing the virus.     Q. When should Evusheld be administered to a patient?   A. Patients should talk to their health care provider to determine whether, based on their individual circumstances, they are eligible to receive Evusheld, and when it should be administered. More information about administration is available in the Fact Sheet for Health Care Providers.     Q. Are there potential side effects of Evusheld?   A. Possible side effects of Evusheld include the following:     Allergic reactions can happen during and after injection of Evusheld. Reactions to Evusheld may include difficulty breathing or swallowing; shortness of breath; wheezing; swelling of the face, lips, tongue or throat; rash including hives; or itching.     The side effects of getting any medicine by intramuscular injection may include pain, bruising of the skin, soreness, swelling, and possible bleeding or infection at the injection site.     Serious cardiac adverse events (such as myocardial infarction and heart failure) were infrequent in the clinical trial evaluating Evusheld for pre-exposure prophylaxis for prevention. However, more trial participants had serious cardiac adverse events after receiving Evusheld compared to placebo. These participants all had risk factors for cardiac disease or a history of cardiovascular disease before participating in the clinical trial. It is not clear if Evusheld caused these cardiac adverse events.     These are not all the possible side effects of Evusheld. Not a lot of people have been given Evusheld. Serious and unexpected side effects may happen. Evusheld is still being studied so it is possible that all of the risks are not known at this time.     Q. Are there reporting requirements for health care facilities and providers as part of the EUA?   A. Yes. As part of the EUA, FDA requires health care providers who prescribe Evusheld to report all medication errors and serious adverse events considered to be potentially related to Evusheld through FDA???s MedWatch Adverse Event Reporting program.     Health care facilities and providers must report therapeutics information and utilization data as directed by the U.S. Department of Health and [employer].     Q. Do patient outcomes need to be reported under the EUA?   A. No, reporting of patient outcomes is not required under the EUA. However, reporting of all medication errors and serious adverse events considered to be potentially related to Evusheld occurring during treatment is required.     Q. FDA has issued a number of EUAs including for therapeutics. If state laws impose different or additional requirements on  the medical product covered by an EUA, are those state laws preempted?   A. As stated in FDA???s Emergency Use Authorization of Medical Products and Related Authorities Guidance, ???FDA believes that the terms and conditions of an EUA issued under section 564 preempt state or local law, both legislative requirements and common-law duties, that impose different or additional requirements on the medical product for which the EUA was issued in the context of the emergency declared under section 564.??? The guidance explains the basis for FDA???s views on this subject.     Q. Can health care providers share the patient/caregiver Fact Sheet electronically?   A. The letter of authorization for Evusheld, requires that Fact Sheets be made available to health care providers and to patients/caregivers ???through appropriate means.??? Electronic delivery of the patient/caregiver Fact Sheet is an appropriate means. For example, when the patient requests the Fact       Sheet electronically, it can be delivered as a PDF prior to medication administration. Health care providers should confirm receipt of the Fact Sheet with the patient.     Q. Can I receive a COVID-19 vaccine if I was treated with a monoclonal antibody for COVID-19?   A. Patients and health care providers should refer to recommendations of the Advisory Committee on Immunization Practices regarding vaccination.     Q. Can I receive Evusheld if I recently received a COVID-19 vaccine?   A. Evusheld may reduce your body???s immune response to a COVID-19 vaccine. If you receive a COVID-19 vaccine, you should wait to receive Evusheld until at least two weeks after your COVID-19 vaccination.     Q. Are there data showing Evusheld may provide benefit for pre-exposure prophylaxis for prevention of COVID-19 in certain patients?   A. Yes. The most important scientific evidence supporting the authorization of Evusheld is from [employer], a randomized, double-blind, placebo-controlled clinical trial in adults who had not received a COVID-19 vaccine and did not have a history of SARS-CoV-2 infection or test positive for SARS-CoV-2 infection at the start of the trial. All trial participants were either ?54 years of age, had a pre-specified co-morbidity (obesity, congestive heart failure, chronic obstructive pulmonary disease, chronic kidney disease, chronic liver disease, immunocompromised state, or previous history of severe or serious adverse event after receiving any approved vaccine), or were at increased risk of SARS-CoV-2 infection due to their living situation or occupation.     The main outcome measured in the trial was whether the trial participant had a case of documented COVID-19 after receiving Evusheld or placebo and before day 183 of the trial. In this trial, 3,441 people received Evusheld and 1,731 received a placebo. In the primary analysis, Evusheld recipients saw a 77% reduced risk of developing COVID-19 compared to those who received a placebo, a statistically significant difference. In additional analyses, the reduction in risk of developing COVID-19 was maintained for Evusheld recipients through six months. The safety and effectiveness of this investigational therapy for use in the pre-exposure prevention of COVID-19 continue to be evaluated.        The patient indicates understanding of these issues and agrees to the plan as outlined above.  Contact information provided for any concerns or questions in the interim.  ??  I personally spent 30 minutes face-to-face and non-face-to-face in the care of this patient, which includes all pre, intra, and post visit time on the date of service.    Daniel Johndrow C. Scarlette Calico, MD, PhD  Assistant Professor of Medicine  Department of  Medicine/Division of Rheumatology  [facility]  8:56 PM

## 2021-02-13 NOTE — Unmapped (Signed)
Clinical Assessment Needed For: Dose Change  Medication: Rasuvo pf 20mg /0.5ml pens  Last Fill Date/Day Supply: 01/21/21 / 84 days  Copay $0  Was previous dose already scheduled to fill: No    Notes to Pharmacist:

## 2021-02-17 NOTE — Unmapped (Signed)
[facility] Clinical Assessment & Refill Coordination Note    Mary Waters, DOB: [birth year]  Phone: [phone] (home)     All above HIPAA information was verified with patient.     Was a Nurse, learning disability used for this call? No    Specialty Medication(s):   Inflammatory Disorders: Cosentyx and Rasuvo     Current Outpatient Medications   Medication Sig Dispense Refill   ??? acetaminophen (TYLENOL) 500 MG tablet Take 1,000 mg by mouth.     ??? alcohol swabs (ALCOHOL PADS) PadM Use as directed to prepare injection sites 100 each PRN   ??? baclofen (LIORESAL) 10 MG tablet Take 1 tablet (10 mg total) by mouth Three (3) times a day as needed for muscle spasms. 270 tablet 3   ??? clindamycin (CLEOCIN T) 1 % lotion Apply topically to affected bumps two (2) times a  until resolved. 60 mL 5   ??? escitalopram oxalate (LEXAPRO) 10 MG tablet Take 1 tablet (10 mg total) by mouth daily. 90 tablet 3   ??? gabapentin (NEURONTIN) 300 MG capsule TAKE 1 CAPSULE (300 MG TOTAL) BY MOUTH NIGHTLY. 90 capsule 3   ??? leucovorin (WELLCOVORIN) 5 mg tablet Take 4 tablets (20 mg total) by mouth every seven (7) days. Take 12-24 hours after methotrexate. 48 tablet 3   ??? loratadine (CLARITIN) 10 mg tablet Take 1 tablet (10 mg total) by mouth once as needed for allergies. 90 tablet 3   ??? menthol/camphor (CAMPHOR-MENTHOL) 4-10 % Crea Apply topically.     ??? methotrexate, PF, (RASUVO, PF,) 20 mg/0.4 mL AtIn Inject the contents of 1 pen (20 mg) under the skin every seven (7) days. 1.6 mL 11   ??? phosphorated carbohydrate (EMETROL) solution Take 15 mL by mouth once as needed for nausea.     ??? secukinumab (COSENTYX PEN, 2 PENS,) 150 mg/mL PnIj injection Inject the contents of 2 pens (300 mg) under the skin every twenty-eight (28) days. 6 mL 3     No current facility-administered medications for this visit.        Changes to medications: Mary Waters reports no changes at this time.    Allergies   Allergen Reactions   ??? Prednisone Hives ??? Sulfa (Sulfonamide Antibiotics) Hives     Other reaction(s): HIVES  Other reaction(s): HIVES   ??? Sulfasalazine      Other reaction(s): HIVES   ??? Enbrel [Etanercept] Rash       Changes to allergies: No    SPECIALTY MEDICATION ADHERENCE     Cosentyx 150mg /ml: 56 days of medicine on hand   Rasuvo 15mg /0.53ml: 56 days of medicine on hand     Medication Adherence    Patient reported X missed doses in the last month: 0  Specialty Medication: Cosentyx 150mg /ml  Patient is on additional specialty medications: Yes  Additional Specialty Medications: Rasuvo 15mg /0.73ml  Patient Reported Additional Medication X Missed Doses in the Last Month: 0          Specialty medication(s) dose(s) confirmed: Patient reports changes to the regimen as follows: New dose of Rasuvo is 20mg  q7d - patient is aware of and expecting this change     Are there any concerns with adherence? No    Adherence counseling provided? Not needed    CLINICAL MANAGEMENT AND INTERVENTION      Clinical Benefit Assessment:    Do you feel the medicine is effective or helping your condition? Yes    Clinical Benefit counseling provided? Not  needed    Adverse Effects Assessment:    Are you experiencing any side effects? No    Are you experiencing difficulty administering your medicine? No    Quality of Life Assessment:    Rheumatology:   Quality of Life    On a scale of 1 ??? 10 with 1 representing not at all and 10 representing completely ??? how has your rheumatologic condition affected your:  Daily pain level?: decline to answer  Ability to complete your regular daily tasks (prepare meals, get dressed, etc.)?: decline to answer  Ability to participate in social or family activities?: decline to answer         Have you discussed this with your provider? Not needed    Therapy Appropriateness:    Is therapy appropriate? Yes, therapy is appropriate and should be continued    DISEASE/MEDICATION-SPECIFIC INFORMATION      For patients on injectable medications: Patient currently has 2 Cosentyx doses left and next injection is scheduled for 02/19/2021. She is next due for Rasuvo injection 02/19/2021 and she will start her new 20mg  dose that day.    PATIENT SPECIFIC NEEDS     - Does the patient have any physical, cognitive, or cultural barriers? No    - Is the patient high risk? No    - Does the patient require a Care Management Plan? No     - Does the patient require physician intervention or other additional services (i.e. nutrition, smoking cessation, social work)? No      SHIPPING     Specialty Medication(s) to be Shipped:   Inflammatory Disorders: Rasuvo 20mg /0.63ml    Other medication(s) to be shipped: No additional medications requested for fill at this time     Changes to insurance: No    Delivery Scheduled: Yes, Expected medication delivery date: 02/19/2021.     Medication will be delivered via Next Day Courier to the confirmed prescription address in [facility].    The patient will receive a drug information handout for each medication shipped and additional FDA Medication Guides as required.  Verified that patient has previously received a Conservation officer, historic buildings.    All of the patient's questions and concerns have been addressed.    Mary Waters Mary Waters   [facility] Specialty Pharmacist

## 2021-03-02 ENCOUNTER — Ambulatory Visit: Admit: 2021-03-02 | Discharge: 2021-03-02 | Payer: MEDICARE

## 2021-03-06 NOTE — Unmapped (Signed)
Patient has enough medication on hand, rescheduling refill call for 4/1
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
## 2021-03-25 ENCOUNTER — Ambulatory Visit: Admit: 2021-03-25 | Discharge: 2021-03-26 | Payer: MEDICARE | Attending: Family | Primary: Family

## 2021-03-25 DIAGNOSIS — G894 Chronic pain syndrome: Principal | ICD-10-CM

## 2021-03-25 DIAGNOSIS — M542 Cervicalgia: Principal | ICD-10-CM

## 2021-03-25 DIAGNOSIS — G8929 Other chronic pain: Principal | ICD-10-CM

## 2021-03-25 DIAGNOSIS — L4050 Arthropathic psoriasis, unspecified: Principal | ICD-10-CM
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
## 2021-03-26 NOTE — Unmapped (Signed)
[facility] has made a second and final attempt to reach this patient to refill the following medication:Cosentyx & Rasuvo.      We have left voicemails on the following phone numbers: [phone] and have sent a MyChart message.    Dates contacted: 4/1, 4/7  Last scheduled delivery: 2/2 (cosentyx) 3/2 (rasuvo)    The patient may be at risk of non-compliance with this medication. The patient should call the [facility] at [phone] (option 4) to refill medication.    Mary Waters   [facility] Specialty Technician

## 2021-04-15 ENCOUNTER — Ambulatory Visit: Admit: 2021-04-15 | Discharge: 2021-04-16 | Payer: MEDICARE

## 2021-04-15 DIAGNOSIS — M542 Cervicalgia: Principal | ICD-10-CM

## 2021-04-15 DIAGNOSIS — G894 Chronic pain syndrome: Principal | ICD-10-CM

## 2021-04-15 DIAGNOSIS — G8929 Other chronic pain: Principal | ICD-10-CM

## 2021-04-15 DIAGNOSIS — M797 Fibromyalgia: Principal | ICD-10-CM

## 2021-04-15 DIAGNOSIS — R29898 Other symptoms and signs involving the musculoskeletal system: Principal | ICD-10-CM

## 2021-04-15 DIAGNOSIS — M5030 Other cervical disc degeneration, unspecified cervical region: Principal | ICD-10-CM
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
## 2021-04-15 NOTE — Unmapped (Addendum)
It was a pleasure to see you at the [facility] today. I hope that we have identified a plan of care that will help you improve mobility and also will help to improve your discomfort.     Our current working diagnosis and plan of care include:    Your diagnosis:   1. Degenerative cervical disc    2. Fibromyalgia    3. Chronic pain syndrome    4. Chronic cervical pain    5. Weakness of right upper extremity        Recommendations/Plan  ?? Activity: Activities as tolerated, Recommend progressive/daily walking., To prevent falls, remove small loose rugs, keep hallway lights on, and install grab bars. and Monitor your symptoms. Be mindful of the warning symptoms we discussed.  ?? Conservative Care: Let's continue to monitor your symptoms. and We referred you to Physical Therapy  ?? Provided handout with instructions of recommended home exercises for neck pain.  ?? Recommend: Tylenol up to 1000mg   PO TID PRN for pain. Do not exceed daily dose of 3000mg ., OTC topical menthol analgesic (ie. Biofreeze) according to package instructions, OTC diclofenac or Voltaren gel BID PRN for pain according to package instructions and OTC lidocaine patches (ie Solanpas) according to package instructions. Counseled regarding common side effects and appropriate administration of medications.    ?? Medications prescribed today: None and Continue taking medications as prescribed by your Medical Care Team. Counseled regarding common side effects and appropriate administration of medications.   ?? Imaging studies: MRI of the Cervical and Thoracic spine was ordered. Medical necessity: Radiographs have been performed/ordered..  ?? We referred you for the following procedure: None.   ?? Labs: None.  ?? Please note I will notify you if any results are abnormal.       ?? Follow-up: After imaging studies are completed.    It is important that you continue to monitor your symptoms and return to clinic if you are not improving, experience new symptoms, or have further care questions. Please send [place] a message via MyChart or call us at [phone] for any questions or concerns.    Seek emergency care for red flag signs such as new or progressive motor weakness, sensory deficits, saddle anesthesia, bowel/bladder dysfunction, gait/coordination disturbance, weight loss, and night pain that should prompt evaluation at nearest ER.    Be well,    Wyatt Haste, FNP   [facility]  [phone]      Patient Education        Neck Arthritis: Exercises  Introduction  Here are some examples of exercises for you to try. The exercises may be suggested for a condition or for rehabilitation. Start each exercise slowly. Ease off the exercises if you start to have pain.  You will be told when to start these exercises and which ones will work best for you.  How to do the exercises  Neck stretches to the side    1. This stretch works best if you keep your shoulder down as you lean away from it. To help you remember to do this, start by relaxing your shoulders and lightly holding on to your thighs or your chair.  2. Tilt your head toward your shoulder and hold for 15 to 30 seconds. Let the weight of your head stretch your muscles.  3. Repeat 2 to 4 times toward each shoulder.  Chin tuck    1. Lie on the floor with a rolled-up towel under  your neck. Your head should be touching the floor.  2. Slowly bring your chin toward your chest.  3. Hold for a count of 6, and then relax for up to 10 seconds.  4. Repeat 8 to 12 times.  Active cervical rotation    1. Sit in a firm chair, or stand up straight.  2. Keeping your chin level, turn your head to the right, and hold for 15 to 30 seconds.  3. Turn your head to the left and hold for 15 to 30 seconds.  4. Repeat 2 to 4 times to each side.  Shoulder blade squeeze    1. While standing, squeeze your shoulder blades together.  2. Do not raise your shoulders up as you are squeezing.  3. Hold for 6 seconds.  4. Repeat 8 to 12 times.  Shoulder rolls    1. Sit comfortably with your feet shoulder-width apart. You can also do this exercise standing up.  2. Roll your shoulders up, then back, and then down in a smooth, circular motion.  3. Repeat 2 to 4 times.  Follow-up care is a key part of your treatment and safety. Be sure to make and go to all appointments, and call your doctor if you are having problems. It's also a good idea to know your test results and keep a list of the medicines you take.  Where can you learn more?  Go to [facility] at [email or web address].[email or web address] forces logistics/[email or web address] officer in the Menu. Enter [phone] in the search box to learn more about Neck Arthritis: Exercises.  Current as of: June 19, 2020??????????????????????????????Content Version: 13.2  ?? 2006-2022 [employer].   Care instructions adapted under license by [facility]. If you have questions about a medical condition or this instruction, always ask your healthcare professional. [employer] disclaims any warranty or liability for your use of this information.

## 2021-04-30 ENCOUNTER — Ambulatory Visit: Admit: 2021-04-30 | Discharge: 2021-05-01 | Payer: MEDICARE

## 2021-05-04 NOTE — Unmapped (Signed)
Hi Cornell,     Your cervical MRI shows severe canal narrowing in your neck which may be creating your weakness. We will discuss next steps at our follow up visit.

## 2021-05-05 ENCOUNTER — Ambulatory Visit: Admit: 2021-05-05 | Discharge: 2021-05-06 | Payer: MEDICARE

## 2021-05-05 DIAGNOSIS — M4802 Spinal stenosis, cervical region: Principal | ICD-10-CM
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
## 2021-05-05 NOTE — Unmapped (Signed)
It was a pleasure to see you at the [facility] today. I hope that we have identified a plan of care that will help you improve mobility and also will help to improve your discomfort.     Our current working diagnosis and plan of care include:    Your diagnosis:   1. Spinal stenosis in cervical region        Recommendations/Plan  ?? Activity: Activities as tolerated, Recommend progressive/daily walking., To prevent falls, remove small loose rugs, keep hallway lights on, and install grab bars. and Monitor your symptoms. Be mindful of the warning symptoms we discussed.  ?? Conservative Care: Let's continue to monitor your symptoms.  ?? Recommend: Tylenol up to 1000mg   PO TID PRN for pain. Do not exceed daily dose of 3000mg ., OTC topical menthol analgesic (ie. Biofreeze) according to package instructions, OTC diclofenac or Voltaren gel BID PRN for pain according to package instructions and OTC lidocaine patches (ie Solanpas) according to package instructions. Counseled regarding common side effects and appropriate administration of medications.    ?? Medications prescribed today: Continue taking medications as prescribed by your Medical Care Team. Counseled regarding common side effects and appropriate administration of medications.   ?? Imaging studies: None.  ?? We referred you for the following procedure: None.   ?? Labs: None.  ?? I have placed a referral for you to see Dr. Lorenda Peck, an orthopaedic surgeon at the Spine center      ?? Follow-up: Pending evaluation with surgeon.     It is important that you continue to monitor your symptoms and return to clinic if you are not improving, experience new symptoms, or have further care questions. Please send [place] a message via MyChart or call us at [phone] for any questions or concerns.    Seek emergency care for red flag signs such as new or progressive motor weakness, sensory deficits, saddle anesthesia, bowel/bladder dysfunction, gait/coordination disturbance, weight loss, and night pain that should prompt evaluation at nearest ER.    Be well,    Mary Haste, FNP   [facility]  [phone]

## 2021-05-14 MED ORDER — CLOBETASOL 0.05 % TOPICAL OINTMENT
Freq: Two times a day (BID) | TOPICAL | 1 refills | 0.00000 days | Status: CN
Start: 2021-05-14 — End: 2022-05-14

## 2021-05-14 NOTE — Unmapped (Signed)
[facility] Refill Coordination Note    Specialty Medication(s) to be Shipped:   Inflammatory Disorders: Rasuvo 20mg /0.20ml    Other medication(s) to be shipped: clobetasol, leucovorin     Mary Waters, DOB: 01/07/1966  Phone: [phone] (home)       All above HIPAA information was verified with patient.     Was a Nurse, learning disability used for this call? No    Completed refill call assessment today to schedule patient's medication shipment from the [facility] [phone]).  All relevant notes have been reviewed.     Specialty medication(s) and dose(s) confirmed: Regimen is correct and unchanged.   Changes to medications: [name] reports no changes at this time.  Changes to insurance: No  New side effects reported not previously addressed with a pharmacist or physician: None reported  Questions for the pharmacist: No    Confirmed patient received a Conservation officer, historic buildings and a Surveyor, mining with first shipment. The patient will receive a drug information handout for each medication shipped and additional FDA Medication Guides as required.       DISEASE/MEDICATION-SPECIFIC INFORMATION        For patients on injectable medications: Patient currently has 1 Cosentyx dose left next injection is scheduled for 05/19/2021. Patient currently has 1 Rasuvo dose left next injection is scheduled for 05/19/2021.    SPECIALTY MEDICATION ADHERENCE     Medication Adherence    Patient reported X missed doses in the last month: 3-4  Specialty Medication: Rasuvo 20mg /0.26ml  Patient is on additional specialty medications: Yes  Additional Specialty Medications: Coxentyx 150mg /ml        Were doses missed due to medication being on hold? No    Coxentyx 150mg /ml: 28 days of medicine on hand   Rasuvo 20mg /0.60ml: 7 days of medicine on hand     REFERRAL TO PHARMACIST     Referral to the pharmacist: Not needed      [facility]     Shipping address confirmed in Epic.     Delivery Scheduled: Yes, Expected medication delivery date: 05/21/2021.     Medication will be delivered via Next Day Courier to the prescription address in Epic WAM.    Lupita Shutter   [facility] Specialty Pharmacist

## 2021-05-14 NOTE — Unmapped (Signed)
[facility] RHEUMATOLOGY CLINIC - PHARMACIST NOTES    [name] is currently filling Cosentyx/Rasuvo through the [facility]. The [facility] have made multiple unsuccessful attempts to refill her medication(s) the past few weeks.     Attempt to reach patient from clinic for follow up today. Spoke with patient and transferred call to [facility] pharmacy for medication refill.     Chelsea Aus

## 2021-05-18 DIAGNOSIS — L403 Pustulosis palmaris et plantaris: Principal | ICD-10-CM

## 2021-05-21 ENCOUNTER — Ambulatory Visit: Admit: 2021-05-21 | Discharge: 2021-05-22 | Payer: MEDICARE

## 2021-05-21 DIAGNOSIS — M4802 Spinal stenosis, cervical region: Principal | ICD-10-CM

## 2021-05-21 MED FILL — LEUCOVORIN CALCIUM 5 MG TABLET: ORAL | 84 days supply | Qty: 48 | Fill #0

## 2021-05-21 MED FILL — RASUVO (PF) 20 MG/0.4 ML SUBCUTANEOUS AUTO-INJECTOR: SUBCUTANEOUS | 28 days supply | Qty: 1.6 | Fill #1
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
## 2021-05-21 NOTE — Unmapped (Signed)
ORTHOPAEDIC SPINE SURGERY       Arvil Persons. Lorenda Peck, MD  Assistant Professor  Orthopaedic Spine Surgery  [email or web address]  [email or web address]  [phone]             Your diagnosis: The encounter diagnosis was Spinal stenosis in cervical region.      Recommendations/Plan  Activities as tolerated.  FU in 3 months  Surgery is an option for you, and we think has the potential to help you, if you are interested  If you are interested in surgery, please call the office back, and we can schedule a date  Followup: for preoperative surgery visit (if interested)  Avoid nicotine/tobacco  Maintain a healthy weight    Stay physically active and exercise regularly as tolerated ([email or web address])  Maintain good sleeping habits    We want to improve, and you can help.  You may receive a survey asking you about your visit.  Please take a few minutes to complete the survey.  We will use your feedback to make improvements.    Contact our team electronically via MyChart message to [facility] Clinical Staff.    Contact our team at [phone] or [phone] with any questions/concerns.  If your symptoms abruptly get worse, you should call the office go to the nearest emergency room

## 2021-05-26 DIAGNOSIS — L403 Pustulosis palmaris et plantaris: Principal | ICD-10-CM

## 2021-06-03 ENCOUNTER — Ambulatory Visit
Admit: 2021-06-03 | Discharge: 2021-07-02 | Payer: MEDICARE | Attending: Rehabilitative and Restorative Service Providers" | Primary: Rehabilitative and Restorative Service Providers"

## 2021-06-03 NOTE — Unmapped (Unsigned)
[facility]  OUTPATIENT PHYSICAL THERAPY  06/03/2021  Note Type: Evaluation       Patient Name: Mary Waters  Date of Birth:1966/09/17  Diagnosis:   Encounter Diagnoses   Name Primary?   ??? Fibromyalgia    ??? Chronic pain syndrome    ??? Chronic cervical pain    ??? Degenerative cervical disc      Referring MD:  Wyatt Haste, FNP     Date of Onset of Impairment-06/04/2016  Date PT Care Plan Established or Reviewed-06/03/2021  Date PT Treatment Started-06/03/2021   Plan of Care Effective Date:     Assessment/Plan:    Assessment details:       [name] is a 55 y.o. female who presents for Physical Therapy Evaluation with c/c of chronic neck pain with RUE symptoms affecting digits 1-3. Primary impairments include gross cervical and thoracic hypomobility with reproduction of pt's pain, RUE symptom recreation with ULTT 2a, and decreased cervical AROM. Clinical presentation today is consistent with neck pain with mobility deficits. The patient will benefit from skilled Physical Therapy intervention to address the moderate impairments listed below and to assist the patient in maximizing her functional independence and safe return to prior level of function.   Impairments: decreased endurance, pain, decreased strength and decreased range of motion    Personal Factors/Comorbidities: 1    Examination of Body Systems: 1-2 elements  Body System: Activity limitations, participation restrictions, muscle strength, peripheral nervous system, balance systems  Clinical Presentation: stable  Clinical Decision Making: low  Prognosis: good prognosis  Positive Prognosis Rationale: response to trial tx.  Negative Prognosis Rationale: Pain Status, medical status/condition, chronicity of condition and severity of symptoms.    Therapy Goals  Goals:      1. In 10 weeks the patient will demonstrate independent performance of HEP to maintain functional gains.   2. In 10 weeks the patient will demonstrate cervical rotation > 80 deg B to indicate improved ability to check blindspots while driving.   3. In 10 weeks the patient will be able to sleep through the night without waking due to pain.         Plan  Therapy options: will be seen for skilled physical therapy services  Planned therapy interventions: Aquatic Therapy, Location manager, Education - Patient, Endurance Activites, Functional Mobility, Gait Training, Home Exercise Program, Manual Therapy, Neuromuscular Re-education, Therapeutic Activities and Therapeutic Exercises  DME Equipment: [employer].  Frequency: 1x week  Duration in weeks: 10  Education provided to: patient.  Education provided: HEP, Treatment options and plan, Symptom management, Safety education, Importance of Therapy, Anatomy, Body mechanics, Role of therapy in Rehabilitation and Posture  Education results: verbalized good understanding, demonstrates understanding and needs reinforcement.  Communication/Consultation: N/A.              Subjective:   History of Present Illness  Date of Onset: 06/04/2016    Date of Evaluation: 06/03/2021    Reason for Referral/Chief Complaint:       Cervical pain with RUE symptoms  Subjective:     Pt is a 55 y/o female presenting to PT with c/c of neck pain and RUE symptoms of insidious onset. Pains can come on separate and has been going on for 5-6 years. Pt states that certain ways of sitting will increase her pain but will improve with pillow support. States that she feels some pain in R forearm but this was abolished with wearing a fitbit. Biggest complaint right now is  feeling stiff in neck and R hand, feeling difficulty with gripping pen or pencil. Has been trying to walk but states she will get some lateral shin pain that stops her. Having difficulty with seeing while driving. Has some loss of distal 1-3 finger sensation on RUE.     Has fluctuating pain with fibromyalgia and has flairs in pain that can last for about a week.     Sleeping can be very rough. Has to adjust position 2-3 times per night.   Quality of life: fair    Pain  Current pain rating: 3  At best pain rating: 0  At worst pain rating: 7  Quality: aching, burning, constant, intermittent, throbbing, tightness, stiffness, radiating and numb  Relieving factors: medications and rest  Aggravating factors: as the day progresses, sitting and sleeping  Pain Related Behaviors: none  Progression: no change  Red flags: none.  Prior Functional Status: No physical limitations.    Current functional status: limited exercise, limited standing tolerance, limited household activities, limited recreation, limited walking tolerance, limited bending and disturbed sleep    Precautions and Equipment  Precautions: None  Current Braces/Orthoses: None  Equipment Currently Used: None  Social Support  Lives in: one-story house  Hand dominance: right  Communication Preference: verbal, written and visual  Barriers to Learning: No Barriers      Treatments  Previous treatment: medication  Current treatment: medication and physical therapy      Patient Goals  Patient goal: Improve neck and upper back stiffness, improve RUE numbness, be able to turn head fully, sleep better      Objective:   Objective    Observation: minimal cervical motion noted during subjective    A/PROM -  Cervical:  Flexion: 38  Extension: 24  Rotation   Right: 41  Left 58  Sidbending  Right: 20  Left 20   Quadrant   Right +  Left +    Shoulder: symmetrical flexion and abduction, slight pain at end range abduction    NEUROLOGIC SCREEN -  Reflexes:  Biceps 2+  Brachioradialis 1+  Triceps 2+    Myotomes: normal B C4-T1   Dermatomes: normal B C4-T1    PALPATION -(+) TTP to B UT/LS, cervical paraspinals    JOINT MOBILITY -  Cervical Spine:    Central Normal upper cervical, hypo lower cervical   Unilateral NT  Thoracic Spine:    Central Hypo C1-8    Special test:  ULTT 2a: (+) R, (-) L  ULTT 2b: (-) B  ULTT 3: (-) B  Cervical compression: (-)  Spurling's A: (-) B  Distraction: feels good    TREATMENT:  - Physical Therapy Evaluation  Manual therapy: Total Time: 10 minutes.  Cervical-thoracic CPAs gr. 2-3 C4-T6  Therapeutic exercise: Total Time: 10 minutes.  Education on evaluative findings, prognosis, POC, HEP  B cervical SNAGs 10x  Thoracic extensions in chair 10x    Total Treatment Time: 45 minutes    PT Evaluation Charges  $$ PT Evaluation - Low Complexity [mins]: 25     Therapeutic Interventions Charges  $$ Therapeutic Exercise [mins]: 10  $$ Manual Therapy [mins]: 10                 I attest that I have reviewed the above information.  Signed: Verneita Griffes, PT  06/03/2021 1:25 PM Jean Rosenthal, PT  06/03/2021 8:53 AM

## 2021-06-05 NOTE — Unmapped (Signed)
[facility] Refill Coordination Note    Specialty Medication(s) to be Shipped:   Inflammatory Disorders: Cosentyx and Rasuvo    Other medication(s) to be shipped: escitalopram     Mary Waters, DOB: 1966-06-09  Phone: [phone] (home)       All above HIPAA information was verified with patient.     Was a Nurse, learning disability used for this call? No    Completed refill call assessment today to schedule patient's medication shipment from the [facility] [phone]).  All relevant notes have been reviewed.     Specialty medication(s) and dose(s) confirmed: Regimen is correct and unchanged.   Changes to medications: [name] reports no changes at this time.  Changes to insurance: No  New side effects reported not previously addressed with a pharmacist or physician: None reported  Questions for the pharmacist: No    Confirmed patient received a Conservation officer, historic buildings and a Surveyor, mining with first shipment. The patient will receive a drug information handout for each medication shipped and additional FDA Medication Guides as required.       DISEASE/MEDICATION-SPECIFIC INFORMATION        For patients on injectable medications: Patient currently has 2 doses of rasuvo, 0 doses of cosentyx doses left.  Next injection is scheduled for 6/30.    SPECIALTY MEDICATION ADHERENCE     Medication Adherence    Patient reported X missed doses in the last month: 1  Specialty Medication: Cosentyx  Patient is on additional specialty medications: No  Patient is on more than two specialty medications: No  Any gaps in refill history greater than 2 weeks in the last 3 months: no  Demonstrates understanding of importance of adherence: yes  Informant: patient              Were doses missed due to medication being on hold? No    Cosentyx 150mg /ml: Patient has 0 days of medication on hand  Rasuvo 20mg /0.55ml: Patient has 14 days of medication on hand    REFERRAL TO PHARMACIST     Referral to the pharmacist: Not needed      [facility]     Shipping address confirmed in Epic.     Delivery Scheduled: Yes, Expected medication delivery date: 6/28.     Medication will be delivered via Same Day Courier to the prescription address in Epic WAM.    Olga Millers   [facility] Specialty Technician

## 2021-06-16 MED FILL — COSENTYX PEN 300 MG/2 PENS (150 MG/ML) SUBCUTANEOUS: SUBCUTANEOUS | 84 days supply | Qty: 6 | Fill #2

## 2021-06-16 MED FILL — ESCITALOPRAM 10 MG TABLET: ORAL | 90 days supply | Qty: 90 | Fill #3

## 2021-06-16 MED FILL — RASUVO (PF) 20 MG/0.4 ML SUBCUTANEOUS AUTO-INJECTOR: SUBCUTANEOUS | 28 days supply | Qty: 1.6 | Fill #2

## 2021-06-24 NOTE — Unmapped (Signed)
[facility] PT [facility] [place]  OUTPATIENT PHYSICAL THERAPY  06/24/2021  Note Type: Treatment Note       Patient Name: Mary Waters  Date of Birth:Sep 16, 1966  Diagnosis:   Encounter Diagnoses   Name Primary?   ??? Fibromyalgia Yes   ??? Chronic pain syndrome    ??? Chronic cervical pain      Referring MD:  Wyatt Haste, FNP     Date of Onset of Impairment-06/04/2016  Date PT Care Plan Established or Reviewed-06/03/2021  Date PT Treatment Started-06/03/2021   Plan of Care Effective Date:     Visit: 2    Assessment/Plan:    Assessment details:       Pt presents for first follow up with large reduction in symptoms and improvement in cervical AROM upon re-assessment today, indicating appropriate POC. Continued with manual interventions to thoracic and cervical spine today and progress mobility focused exercise with introduction of posterior thoracic strengthening. Will plan to follow up on HEP and soreness at next appt. The patient will benefit from skilled Physical Therapy intervention to address the moderate impairments listed below and to assist the patient in maximizing her functional independence and safe return to prior level of function.   Impairments: decreased endurance, pain, decreased strength and decreased range of motion    Personal Factors/Comorbidities: 1    Examination of Body Systems: 1-2 elements  Body System: Activity limitations, participation restrictions, muscle strength, peripheral nervous system, balance systems  Clinical Presentation: stable  Clinical Decision Making: low  Prognosis: good prognosis  Positive Prognosis Rationale: response to trial tx.  Negative Prognosis Rationale: Pain Status, medical status/condition, chronicity of condition and severity of symptoms.    Therapy Goals  Goals:      1. In 10 weeks the patient will demonstrate independent performance of HEP to maintain functional gains.   2. In 10 weeks the patient will demonstrate cervical rotation > 80 deg B to indicate improved ability to check blindspots while driving.   3. In 10 weeks the patient will be able to sleep through the night without waking due to pain.         Plan  Therapy options: will be seen for skilled physical therapy services  Planned therapy interventions: Aquatic Therapy, Location manager, Education - Patient, Endurance Activites, Functional Mobility, Gait Training, Home Exercise Program, Manual Therapy, Neuromuscular Re-education, Therapeutic Activities and Therapeutic Exercises  DME Equipment: Theraband.  Frequency: 1x week  Duration in weeks: 10  Education provided to: patient.  Education provided: HEP, Treatment options and plan, Symptom management, Safety education, Importance of Therapy, Anatomy, Body mechanics, Role of therapy in Rehabilitation and Posture  Education results: verbalized good understanding, demonstrates understanding and needs reinforcement.  Communication/Consultation: N/A.              Subjective:   History of Present Illness  Date of Onset: 06/04/2016    Date of Evaluation: 06/03/2021    Reason for Referral/Chief Complaint:       Cervical pain with RUE symptoms  Subjective:     Pt reports that the neck has been doing better and the R arm has not been hurting. Exercise is going well but has difficulty with finding correct chair. Sleep has been getting better, re-adjusting less through the night. 0/10 neck pain currently.  Quality of life: fair    Pain  Current pain rating: 3  At best pain rating: 0  At worst pain rating: 7  Quality: aching, burning, constant, intermittent, throbbing, tightness,  stiffness, radiating and numb  Relieving factors: medications and rest  Aggravating factors: as the day progresses, sitting and sleeping  Pain Related Behaviors: none  Progression: no change  Red flags: none.  Prior Functional Status: No physical limitations.    Current functional status: limited exercise, limited standing tolerance, limited household activities, limited recreation, limited walking tolerance, limited bending and disturbed sleep    Precautions and Equipment  Precautions: None  Current Braces/Orthoses: None  Equipment Currently Used: None  Social Support  Lives in: one-story house  Hand dominance: right  Communication Preference: verbal, written and visual  Barriers to Learning: No Barriers      Treatments  Previous treatment: medication  Current treatment: medication and physical therapy      Patient Goals  Patient goal: Improve neck and upper back stiffness, improve RUE numbness, be able to turn head fully, sleep better      Objective:   Objective      Observation: minimal cervical motion noted during subjective    A/PROM -  Cervical:  Flexion: 45  Extension: 35  Rotation   Right: 62  Left 61    PALPATION -(+) TTP to B UT/LS, cervical paraspinals    JOINT MOBILITY -  Cervical Spine:    Central Normal upper cervical, hypo lower cervical   Unilateral NT  Thoracic Spine:    Central Hypo C1-8    TREATMENT:  Manual therapy: Total Time: 15 minutes.  Cervical-thoracic CPAs gr. 2-3 C4-T6  B cervical sideglides C3/4-5/6 gr. 2-3  Therapeutic exercise: Total Time: 25 minutes.  Education on evaluative findings, prognosis, POC, HEP  Thoracic extensions in chair 10x  Open books 15x each  Thoracic threading 3x5 each UE  Prone Ts 2x8    Total Treatment Time: 40 minutes          Therapeutic Interventions Charges  $$ Therapeutic Exercise [mins]: 25  $$ Manual Therapy [mins]: 15                 I attest that I have reviewed the above information.  Signed: Verneita Griffes, PT  06/24/2021 11:33 AM

## 2021-06-29 NOTE — Unmapped (Signed)
[facility] PT [facility]   OUTPATIENT PHYSICAL THERAPY  06/29/2021  Note Type: Treatment Note       Patient Name: Mary Waters  Date of Birth:03-11-1966  Diagnosis:   Encounter Diagnoses   Name Primary?   ??? Fibromyalgia Yes   ??? Chronic pain syndrome    ??? Chronic cervical pain    ??? Degenerative cervical disc      Referring MD:  Wyatt Haste, FNP     Date of Onset of Impairment-06/04/2016  Date PT Care Plan Established or Reviewed-06/03/2021  Date PT Treatment Started-06/03/2021   Plan of Care Effective Date:     Visit: 2    Assessment/Plan:    Assessment details:       Had good discussion on radiographic findings and how these can relate to pain experience, which helped to address some pt fears. Cervical spine pain today presents with minimal discomfort and states more R sided mid-thoracic pain today which was reproduced with thoracic rotation and CPAs to T7-8. Progressed scapular strengthening and will monitor response/soreness at next appt. The patient will benefit from skilled Physical Therapy intervention to address the moderate impairments listed below and to assist the patient in maximizing her functional independence and safe return to prior level of function.   Impairments: decreased endurance, pain, decreased strength and decreased range of motion    Personal Factors/Comorbidities: 1    Examination of Body Systems: 1-2 elements  Body System: Activity limitations, participation restrictions, muscle strength, peripheral nervous system, balance systems  Clinical Presentation: stable  Clinical Decision Making: low  Prognosis: good prognosis  Positive Prognosis Rationale: response to trial tx.  Negative Prognosis Rationale: Pain Status, medical status/condition, chronicity of condition and severity of symptoms.    Therapy Goals  Goals:      1. In 10 weeks the patient will demonstrate independent performance of HEP to maintain functional gains.   2. In 10 weeks the patient will demonstrate cervical rotation > 80 deg B to indicate improved ability to check blindspots while driving.   3. In 10 weeks the patient will be able to sleep through the night without waking due to pain.         Plan  Therapy options: will be seen for skilled physical therapy services  Planned therapy interventions: Aquatic Therapy, Location manager, Education - Patient, Endurance Activites, Functional Mobility, Gait Training, Home Exercise Program, Manual Therapy, Neuromuscular Re-education, Therapeutic Activities and Therapeutic Exercises  DME Equipment: Theraband.  Frequency: 1x week  Duration in weeks: 10  Education provided to: patient.  Education provided: HEP, Treatment options and plan, Symptom management, Safety education, Importance of Therapy, Anatomy, Body mechanics, Role of therapy in Rehabilitation and Posture  Education results: verbalized good understanding, demonstrates understanding and needs reinforcement.  Communication/Consultation: N/A.              Subjective:   History of Present Illness  Date of Onset: 06/04/2016    Date of Evaluation: 06/03/2021    Reason for Referral/Chief Complaint:       Cervical pain with RUE symptoms  Subjective:     Pt reports that her neck is doing well, feeling some R sided mid back pain today which she feels may be due to some stress. Current neck pain is 1/10.   Quality of life: fair    Pain  Current pain rating: 1  At best pain rating: 0  At worst pain rating: 7  Quality: aching, burning, constant, intermittent, throbbing, tightness, stiffness, radiating  and numb  Relieving factors: medications and rest  Aggravating factors: as the day progresses, sitting and sleeping  Pain Related Behaviors: none  Progression: no change  Red flags: none.  Prior Functional Status: No physical limitations.    Current functional status: limited exercise, limited standing tolerance, limited household activities, limited recreation, limited walking tolerance, limited bending and disturbed sleep    Precautions and Equipment  Precautions: None  Current Braces/Orthoses: None  Equipment Currently Used: None  Social Support  Lives in: one-story house  Hand dominance: right  Communication Preference: verbal, written and visual  Barriers to Learning: No Barriers      Treatments  Previous treatment: medication  Current treatment: medication and physical therapy      Patient Goals  Patient goal: Improve neck and upper back stiffness, improve RUE numbness, be able to turn head fully, sleep better      Objective:   Objective    Observation: minimal cervical motion noted during subjective    A/PROM -  Cervical:  Flexion: 45  Extension: 35  Rotation   Right: 62  Left 61    PALPATION -(+) TTP to B UT/LS, cervical paraspinals    JOINT MOBILITY -  Cervical Spine:    Central Normal upper cervical, hypo lower cervical   Unilateral NT  Thoracic Spine:    Central Hypo C1-8    TREATMENT:  Manual therapy: Total Time: 20 minutes.  Cervical-thoracic CPAs gr. 2-3 C4-T8, pt reporting some R thoracic referral at C7-8  B cervical sideglides C3/4-5/6 gr. 2-3  STM to B UT/LS  Therapeutic exercise: Total Time: 25 minutes.  Education on evaluative findings, prognosis, POC, HEP  Scapular wall slide orange band 3x8  Open books 15x each  Resisted thoracic rotation green loop band, 2x15 each side    Total Treatment Time: 40 minutes          Therapeutic Interventions Charges  $$ Therapeutic Exercise [mins]: 25  $$ Manual Therapy [mins]: 20                 I attest that I have reviewed the above information.  Signed: Verneita Griffes, PT  06/29/2021 11:25 AM

## 2021-07-14 NOTE — Unmapped (Signed)
[facility] Refill Coordination Note    Specialty Medication(s) to be Shipped:   Inflammatory Disorders: Rasuvo    Other medication(s) to be shipped: No additional medications requested for fill at this time     Mary Waters, DOB: 1966/06/05  Phone: [phone] (home)       All above HIPAA information was verified with patient.     Was a Nurse, learning disability used for this call? No    Completed refill call assessment today to schedule patient's medication shipment from the [facility] [phone]).  All relevant notes have been reviewed.     Specialty medication(s) and dose(s) confirmed: Regimen is correct and unchanged.   Changes to medications: [place] reports no changes at this time.  Changes to insurance: No  New side effects reported not previously addressed with a pharmacist or physician: None reported  Questions for the pharmacist: No    Confirmed patient received a Conservation officer, historic buildings and a Surveyor, mining with first shipment. The patient will receive a drug information handout for each medication shipped and additional FDA Medication Guides as required.       DISEASE/MEDICATION-SPECIFIC INFORMATION        For patients on injectable medications: Patient currently has 1 doses left.  Next injection is scheduled for 7/28.    SPECIALTY MEDICATION ADHERENCE     Medication Adherence    Patient reported X missed doses in the last month: 0  Specialty Medication: Rasuvo  Patient is on additional specialty medications: No  Patient is on more than two specialty medications: No  Any gaps in refill history greater than 2 weeks in the last 3 months: no  Demonstrates understanding of importance of adherence: yes  Informant: patient              Were doses missed due to medication being on hold? No    Rasuvo 20mg /0.37ml: Patient has 7 days of medication on hand     REFERRAL TO PHARMACIST     Referral to the pharmacist: Not needed      [facility]     Shipping address confirmed in Epic.     Delivery Scheduled: Yes, Expected medication delivery date: 8/2.     Medication will be delivered via Same Day Courier to the prescription address in Epic WAM.    Olga Millers   [facility] Specialty Technician

## 2021-07-15 ENCOUNTER — Ambulatory Visit: Admit: 2021-07-15 | Discharge: 2021-08-01 | Payer: MEDICARE

## 2021-07-15 ENCOUNTER — Ambulatory Visit
Admit: 2021-07-15 | Discharge: 2021-08-01 | Payer: MEDICARE | Attending: Rehabilitative and Restorative Service Providers" | Primary: Rehabilitative and Restorative Service Providers"

## 2021-07-15 NOTE — Unmapped (Signed)
[facility] PT [facility] [place]  OUTPATIENT PHYSICAL THERAPY  07/15/2021  Note Type: Treatment Note       Patient Name: Mary Waters  Date of Birth:30-Sep-1966  Diagnosis:   Encounter Diagnoses   Name Primary?   ??? Fibromyalgia Yes   ??? Chronic pain syndrome    ??? Chronic cervical pain    ??? Degenerative cervical disc      Referring MD:  Wyatt Haste, FNP     Date of Onset of Impairment-06/04/2016  Date PT Care Plan Established or Reviewed-06/03/2021  Date PT Treatment Started-06/03/2021   Plan of Care Effective Date:     Visit: 3    Assessment/Plan:    Assessment details:       Pt presenting with continued resolution of persistent neck pain but with increase in B UE symptoms. This was reproduced with ULTT 2A in median distribution from elbow to distal 1-3 digits. Pt notes improvement in these symptoms with manual techniques at C-spine and educated pt on performance of neural glide exercises to add to HEP. The patient will benefit from skilled Physical Therapy intervention to address the moderate impairments listed below and to assist the patient in maximizing her functional independence and safe return to prior level of function.   Impairments: decreased endurance, pain, decreased strength and decreased range of motion    Personal Factors/Comorbidities: 1    Examination of Body Systems: 1-2 elements  Body System: Activity limitations, participation restrictions, muscle strength, peripheral nervous system, balance systems  Clinical Presentation: stable  Clinical Decision Making: low  Prognosis: good prognosis  Positive Prognosis Rationale: response to trial tx.  Negative Prognosis Rationale: Pain Status, medical status/condition, chronicity of condition and severity of symptoms.    Therapy Goals  Goals:      1. In 10 weeks the patient will demonstrate independent performance of HEP to maintain functional gains.   2. In 10 weeks the patient will demonstrate cervical rotation > 80 deg B to indicate improved ability to check blindspots while driving.   3. In 10 weeks the patient will be able to sleep through the night without waking due to pain.         Plan  Therapy options: will be seen for skilled physical therapy services  Planned therapy interventions: Aquatic Therapy, Location manager, Education - Patient, Endurance Activites, Functional Mobility, Gait Training, Home Exercise Program, Manual Therapy, Neuromuscular Re-education, Therapeutic Activities and Therapeutic Exercises  DME Equipment: Theraband.  Frequency: 1x week  Duration in weeks: 10  Education provided to: patient.  Education provided: HEP, Treatment options and plan, Symptom management, Safety education, Importance of Therapy, Anatomy, Body mechanics, Role of therapy in Rehabilitation and Posture  Education results: verbalized good understanding, demonstrates understanding and needs reinforcement.  Communication/Consultation: N/A.              Subjective:   History of Present Illness  Date of Onset: 06/04/2016    Date of Evaluation: 06/03/2021    Reason for Referral/Chief Complaint:       Cervical pain with RUE symptoms  Subjective:     Pt reports that her neck and sleeping at night is doing well, not having to worry about pain or repositioning at night. Has been noticing some 1-3 digit palmar numbness and tingling (R>L) as well as at lateral elbow. Straightening elbow will help with pain but states shaking hands does not affect pain. 0/10 neck pain currently.  Quality of life: fair    Pain  Current pain  rating: 1  At best pain rating: 0  At worst pain rating: 7  Quality: aching, burning, constant, intermittent, throbbing, tightness, stiffness, radiating and numb  Relieving factors: medications and rest  Aggravating factors: as the day progresses, sitting and sleeping  Pain Related Behaviors: none  Progression: no change  Red flags: none.  Prior Functional Status:     Functional Limitation(s)-No physical limitations  Current functional status: limited exercise, limited standing tolerance, limited household activities, limited recreation, limited walking tolerance, limited bending and disturbed sleep    Precautions and Equipment  Precautions: None  Current Braces/Orthoses: None  Equipment Currently Used: None  Social Support  Lives in: one-story house  Hand dominance: right  Communication Preference: verbal, written and visual  Barriers to Learning: No Barriers      Treatments  Previous treatment: medication  Current treatment: medication and physical therapy      Patient Goals  Patient goal: Improve neck and upper back stiffness, improve RUE numbness, be able to turn head fully, sleep better      Objective:   Objective    Observation: minimal cervical motion noted during subjective    A/PROM -  Cervical:  Flexion: 45  Extension: 35  Rotation   Right: 62  Left 61    PALPATION -(+) TTP to B UT/LS, cervical paraspinals    JOINT MOBILITY -  Cervical Spine:    Central Normal upper cervical, hypo lower cervical   Unilateral NT  Thoracic Spine:    Central Hypo C1-8    ULLT 2a: (+) on BUE (R>L)    TREATMENT:  Manual therapy: Total Time: 15 minutes.  Cervical-thoracic CPAs gr. 3-4 C4-T4  B cervical sideglides C3/4-5/6 gr. 2-3  Therapeutic exercise: Total Time: 25 minutes.  Education on evaluative findings, prognosis, POC, HEP  Assisted median nerve glide 20x B  Education on performance of nerve glides for home  Review of HEP   Cervical rotation SNAG   Cervical/thoracic ext SNAG    Total Treatment Time: 40 minutes          Therapeutic Interventions Charges  $$ Therapeutic Exercise [mins]: 25  $$ Manual Therapy [mins]: 15                 I attest that I have reviewed the above information.  Signed: Verneita Griffes, PT  07/15/2021 9:46 AM

## 2021-07-21 MED FILL — RASUVO (PF) 20 MG/0.4 ML SUBCUTANEOUS AUTO-INJECTOR: SUBCUTANEOUS | 28 days supply | Qty: 1.6 | Fill #3

## 2021-07-22 DIAGNOSIS — L4050 Arthropathic psoriasis, unspecified: Principal | ICD-10-CM

## 2021-07-22 DIAGNOSIS — Z79899 Other long term (current) drug therapy: Principal | ICD-10-CM

## 2021-07-22 DIAGNOSIS — D849 Immunodeficiency, unspecified: Principal | ICD-10-CM

## 2021-07-22 LAB — CBC W/ AUTO DIFF
BASOPHILS ABSOLUTE COUNT: 0 10*9/L (ref 0.0–0.1)
BASOPHILS RELATIVE PERCENT: 0.8 %
EOSINOPHILS ABSOLUTE COUNT: 0.1 10*9/L (ref 0.0–0.5)
EOSINOPHILS RELATIVE PERCENT: 1.6 %
HEMATOCRIT: 37.3 % (ref 34.0–44.0)
HEMOGLOBIN: 12.3 g/dL (ref 11.3–14.9)
LYMPHOCYTES ABSOLUTE COUNT: 2.3 10*9/L (ref 1.1–3.6)
LYMPHOCYTES RELATIVE PERCENT: 49.7 %
MEAN CORPUSCULAR HEMOGLOBIN CONC: 32.8 g/dL (ref 32.0–36.0)
MEAN CORPUSCULAR HEMOGLOBIN: 29.6 pg (ref 25.9–32.4)
MEAN CORPUSCULAR VOLUME: 90.1 fL (ref 77.6–95.7)
MEAN PLATELET VOLUME: 7.2 fL (ref 6.8–10.7)
MONOCYTES ABSOLUTE COUNT: 0.5 10*9/L (ref 0.3–0.8)
MONOCYTES RELATIVE PERCENT: 10 %
NEUTROPHILS ABSOLUTE COUNT: 1.8 10*9/L (ref 1.8–7.8)
NEUTROPHILS RELATIVE PERCENT: 37.9 %
PLATELET COUNT: 274 10*9/L (ref 150–450)
RED BLOOD CELL COUNT: 4.14 10*12/L (ref 3.95–5.13)
RED CELL DISTRIBUTION WIDTH: 13 % (ref 12.2–15.2)
WBC ADJUSTED: 4.7 10*9/L (ref 3.6–11.2)

## 2021-07-22 LAB — AST: AST (SGOT): 18 U/L (ref <=34)

## 2021-07-22 LAB — CREATININE
CREATININE: 0.81 mg/dL — ABNORMAL HIGH
EGFR CKD-EPI (2021) FEMALE: 86 mL/min/{1.73_m2} (ref >=60)

## 2021-07-22 LAB — ALT: ALT (SGPT): 8 U/L — ABNORMAL LOW (ref 10–49)

## 2021-07-22 LAB — ALBUMIN: ALBUMIN: 4 g/dL (ref 3.4–5.0)

## 2021-07-22 NOTE — Unmapped (Signed)
Skip methotrexate this Thursday, and resume the following Thursday.

## 2021-07-22 NOTE — Unmapped (Signed)
[facility] PT ACC [place]  OUTPATIENT PHYSICAL THERAPY  07/22/2021  Note Type: Treatment Note       Patient Name: Mary Waters  Date of Birth:10-28-1966  Diagnosis:   Encounter Diagnoses   Name Primary?   ??? Fibromyalgia Yes   ??? Chronic pain syndrome    ??? Chronic cervical pain    ??? Degenerative cervical disc      Referring MD:  Wyatt Haste, FNP     Date of Onset of Impairment-06/04/2016  Date PT Care Plan Established or Reviewed-06/03/2021  Date PT Treatment Started-06/03/2021   Plan of Care Effective Date:     Visit: 5    Assessment/Plan:    Assessment details:       Pt presenting with continued resolution of persistent neck pain but with increase in B UE symptoms, however notes more radial BUE distribution of symptoms. ULTT 2b was (+) B for familiar symptoms and pt was instructed on radial nerve glides and added to HEP. As pt notes some positional dizziness today, Dix-Hallpike was assessed but found to be (-) B. Given quick resolution of symptoms and appearance of symptoms with supine to sit/sit to stand, may be beneficial to assess orthostatics at next appt. The patient will benefit from skilled Physical Therapy intervention to address the moderate impairments listed below and to assist the patient in maximizing her functional independence and safe return to prior level of function.   Impairments: decreased endurance, pain, decreased strength and decreased range of motion    Personal Factors/Comorbidities: 1    Examination of Body Systems: 1-2 elements  Body System: Activity limitations, participation restrictions, muscle strength, peripheral nervous system, balance systems  Clinical Presentation: stable  Clinical Decision Making: low  Prognosis: good prognosis  Positive Prognosis Rationale: response to trial tx.  Negative Prognosis Rationale: Pain Status, medical status/condition, chronicity of condition and severity of symptoms.    Therapy Goals  Goals:      1. In 10 weeks the patient will demonstrate independent performance of HEP to maintain functional gains.   2. In 10 weeks the patient will demonstrate cervical rotation > 80 deg B to indicate improved ability to check blindspots while driving.   3. In 10 weeks the patient will be able to sleep through the night without waking due to pain.         Plan  Therapy options: will be seen for skilled physical therapy services  Planned therapy interventions: Aquatic Therapy, Location manager, Education - Patient, Endurance Activites, Functional Mobility, Gait Training, Home Exercise Program, Manual Therapy, Neuromuscular Re-education, Therapeutic Activities and Therapeutic Exercises  DME Equipment: [employer].  Frequency: 1x week  Duration in weeks: 10  Education provided to: patient.  Education provided: HEP, Treatment options and plan, Symptom management, Safety education, Importance of Therapy, Anatomy, Body mechanics, Role of therapy in Rehabilitation and Posture  Education results: verbalized good understanding, demonstrates understanding and needs reinforcement.  Communication/Consultation: N/A.              Subjective:   History of Present Illness  Date of Onset: 06/04/2016    Date of Evaluation: 06/03/2021    Reason for Referral/Chief Complaint:       Cervical pain with RUE symptoms  Subjective:     Pt reports that this last week has noticed some more LUE pain with RUE improving the last week. Has been able to start sleeping on her left side, which she had avoided before. 0/10 neck pain currently. Pt has noticed  some increase in dizziness sensation with positional changes, lasting 15-20 seconds at a time, but denies room spinning sensation, more dysequilibrium.   Quality of life: fair    Pain  Current pain rating: 1  At best pain rating: 0  At worst pain rating: 7  Quality: aching, burning, constant, intermittent, throbbing, tightness, stiffness, radiating and numb  Relieving factors: medications and rest  Aggravating factors: as the day progresses, sitting and sleeping  Pain Related Behaviors: none  Progression: no change  Red flags: none.  Prior Functional Status:     Functional Limitation(s)-No physical limitations  Current functional status: limited exercise, limited standing tolerance, limited household activities, limited recreation, limited walking tolerance, limited bending and disturbed sleep    Precautions and Equipment  Precautions: None  Current Braces/Orthoses: None  Equipment Currently Used: None  Social Support  Lives in: one-story house  Hand dominance: right  Communication Preference: verbal, written and visual  Barriers to Learning: No Barriers      Treatments  Previous treatment: medication  Current treatment: medication and physical therapy      Patient Goals  Patient goal: Improve neck and upper back stiffness, improve RUE numbness, be able to turn head fully, sleep better      Objective:   Objective    Observation: minimal cervical motion noted during subjective    A/PROM -  Cervical:  Flexion: 45  Extension: 35  Rotation   Right: 62  Left 61    PALPATION -(+) TTP to B UT/LS, cervical paraspinals    JOINT MOBILITY -  Cervical Spine:    Central Normal upper cervical, hypo lower cervical   Unilateral NT  Thoracic Spine:    Central Hypo C1-8    ULLT 2a and 2b: (+) on BUE (R>L)    Dix-Hallpike (-) B    TREATMENT:  Manual therapy: Total Time: 15 minutes.  Cervical-thoracic CPAs gr. 3-4 C4-T4  B cervical sideglides C3/4-5/6 gr. 2-3  Therapeutic exercise: Total Time: 25 minutes.  Education on evaluative findings, prognosis, POC, HEP  Radial nerve glides 20x B  Assessment of Dix-Hallpike given subject report of positional dizziness  Education on performance of nerve glides for home  Review of HEP   Cervical rotation SNAG   Cervical/thoracic ext SNAG    Total Treatment Time: 40 minutes          Therapeutic Interventions Charges  $$ Therapeutic Exercise [mins]: 25  $$ Manual Therapy [mins]: 15                 I attest that I have reviewed the above information.  Signed: Verneita Griffes, PT  07/22/2021 9:48 AM
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
## 2021-07-29 NOTE — Unmapped (Signed)
[facility] PT [facility] [place]  OUTPATIENT PHYSICAL THERAPY  07/29/2021  Note Type: Treatment Note       Patient Name: Mary Waters  Date of Birth:1966/12/04  Diagnosis:   Encounter Diagnoses   Name Primary?   ??? Fibromyalgia Yes   ??? Chronic pain syndrome    ??? Chronic cervical pain    ??? Degenerative cervical disc      Referring MD:  Wyatt Haste, FNP     Date of Onset of Impairment-06/04/2016  Date PT Care Plan Established or Reviewed-06/03/2021  Date PT Treatment Started-06/03/2021   Plan of Care Effective Date:     Visit: 6    Assessment/Plan:    Assessment details:       Pt presents noting improved BUE and neck pain today with persistent mid back pain. This pain is recreated with thoracic extension and R rotation but slightly improved with thoracic mobilizations. Pt to discharge from PT at this time with HEP updated to address this last complaint. Educated pt on frequency to perform exercises depending on symptomatology. The patient will benefit from skilled Physical Therapy intervention to address the moderate impairments listed below and to assist the patient in maximizing her functional independence and safe return to prior level of function.   Impairments: decreased endurance, pain, decreased strength and decreased range of motion    Personal Factors/Comorbidities: 1    Examination of Body Systems: 1-2 elements  Body System: Activity limitations, participation restrictions, muscle strength, peripheral nervous system, balance systems  Clinical Presentation: stable  Clinical Decision Making: low  Prognosis: good prognosis  Positive Prognosis Rationale: response to trial tx.  Negative Prognosis Rationale: Pain Status, medical status/condition, chronicity of condition and severity of symptoms.    Therapy Goals  Goals:      1. In 10 weeks the patient will demonstrate independent performance of HEP to maintain functional gains.   2. In 10 weeks the patient will demonstrate cervical rotation > 80 deg B to indicate improved ability to check blindspots while driving.   3. In 10 weeks the patient will be able to sleep through the night without waking due to pain.         Plan  Therapy options: will be seen for skilled physical therapy services  Planned therapy interventions: Aquatic Therapy, Location manager, Education - Patient, Endurance Activites, Functional Mobility, Gait Training, Home Exercise Program, Manual Therapy, Neuromuscular Re-education, Therapeutic Activities and Therapeutic Exercises  DME Equipment: [employer].  Frequency: 1x week  Duration in weeks: 10  Education provided to: patient.  Education provided: HEP, Treatment options and plan, Symptom management, Safety education, Importance of Therapy, Anatomy, Body mechanics, Role of therapy in Rehabilitation and Posture  Education results: verbalized good understanding, demonstrates understanding and needs reinforcement.  Communication/Consultation: N/A.              Subjective:   History of Present Illness  Date of Onset: 06/04/2016    Date of Evaluation: 06/03/2021    Reason for Referral/Chief Complaint:       Cervical pain with RUE symptoms  Subjective:     Pt reports that she is feeling good today, neck pain has been remaining low or abolished. BUE symptoms are doing better and notes she will feel her symptoms during HEP with nerve glides.     Quality of life: fair    Pain  Current pain rating: 1  At best pain rating: 0  At worst pain rating: 7  Quality: aching, burning, constant, intermittent,  throbbing, tightness, stiffness, radiating and numb  Relieving factors: medications and rest  Aggravating factors: as the day progresses, sitting and sleeping  Pain Related Behaviors: none  Progression: no change  Red flags: none.  Prior Functional Status:     Functional Limitation(s)-No physical limitations  Current functional status: limited exercise, limited standing tolerance, limited household activities, limited recreation, limited walking tolerance, limited bending and disturbed sleep    Precautions and Equipment  Precautions: None  Current Braces/Orthoses: None  Equipment Currently Used: None  Social Support  Lives in: one-story house  Hand dominance: right  Communication Preference: verbal, written and visual  Barriers to Learning: No Barriers      Treatments  Previous treatment: medication  Current treatment: medication and physical therapy      Patient Goals  Patient goal: Improve neck and upper back stiffness, improve RUE numbness, be able to turn head fully, sleep better      Objective:   Objective    Observation: minimal cervical motion noted during subjective    A/PROM -  Cervical:  Flexion: 45  Extension: 35  Rotation   Right: 62  Left 61    PALPATION -(+) TTP to B UT/LS, cervical paraspinals    JOINT MOBILITY -  Cervical Spine:    Central Normal upper cervical, hypo lower cervical   Unilateral NT  Thoracic Spine:    Central Hypo C1-8    ULLT 2a and 2b: (+) on BUE (R>L)    Dix-Hallpike (-) B    TREATMENT:  Manual therapy: Total Time: 15 minutes.  Cervical-thoracic CPAs gr. 3-4 T1-8  Therapeutic exercise: Total Time: 25 minutes.  Education on evaluative findings, prognosis, POC, HEP  Seated thoracic extensions 20x  S/l open books 20x  Review of HEP   Cervical rotation SNAG   Cervical/thoracic ext SNAG   Open books   Thoracic extensions   Median/radial nerve glides    Total Treatment Time: 40 minutes          Therapeutic Interventions Charges  $$ Therapeutic Exercise [mins]: 25  $$ Manual Therapy [mins]: 15                 I attest that I have reviewed the above information.  Signed: Verneita Griffes, PT  07/29/2021 1:10 PM

## 2021-08-04 NOTE — Unmapped (Signed)
Abstraction Result Flowsheet Data    This patient's last AWV date: [facility] Last Medicare Wellness Visit Date: Not Found  This patients last WCC/CPE date: : Not Found      Reason for Encounter  Reason for Encounter: Outreach                   Dear Glorianne Manchester,    [facility] is committed to helping you stay healthy. Our records show you are due for a Medicare Annual Wellness Visit.    A benefit for anyone with Medicare, this visit is designed to help prevent illness based on your current health and risk factors--at no cost to you.     An Annual Wellness Visit may include education or counseling about immunizations, and important health measurements such as blood pressure checks, screenings, and referrals for other care if needed.    Please reply to this message with the word ???SCHEDULE,??? and we will contact you to schedule your appointment.    Thank you for the opportunity to care for you.    Noralyn Pick, FNP  [facility]IMARY [facility] AT [facility]

## 2021-08-11 NOTE — Unmapped (Signed)
[facility] Clinical Assessment & Refill Coordination Note    Will skip Rasuvo on 9/6 as she will have Evushield infusion that day and was told to hold for 1 Rasuvo for 1 week.      Mary Waters, DOB: Oct 24, 1966  Phone: [phone] (home)     All above HIPAA information was verified with patient.     Was a Nurse, learning disability used for this call? No    Specialty Medication(s):   Inflammatory Disorders: Cosentyx and Rasuvo     Current Outpatient Medications   Medication Sig Dispense Refill   ??? acetaminophen (TYLENOL) 500 MG tablet Take 1,000 mg by mouth every four (4) hours as needed.      ??? baclofen (LIORESAL) 10 MG tablet Take 1 tablet (10 mg total) by mouth Three (3) times a day as needed for muscle spasms. 270 tablet 3   ??? clindamycin (CLEOCIN T) 1 % lotion Apply topically to affected bumps two (2) times a  until resolved. 60 mL 5   ??? escitalopram oxalate (LEXAPRO) 10 MG tablet Take 1 tablet (10 mg total) by mouth daily. 90 tablet 3   ??? leuCOVorin (WELLCOVORIN) 5 mg tablet Take 4 tablets (20 mg total) by mouth every seven (7) days. Take 12-24 hours after methotrexate. 48 tablet 3   ??? menthol/camphor (CAMPHOR-MENTHOL) 4-10 % Crea Apply topically.     ??? methotrexate, PF, (RASUVO, PF,) 20 mg/0.4 mL AtIn Inject the contents of 1 pen (20 mg) under the skin every seven (7) days. 1.6 mL 11   ??? secukinumab (COSENTYX PEN, 2 PENS,) 150 mg/mL PnIj injection Inject the contents of 2 pens (300 mg) under the skin every twenty-eight (28) days. 6 mL 3     No current facility-administered medications for this visit.        Changes to medications: Alyn reports no changes at this time.    Allergies   Allergen Reactions   ??? Prednisone Hives   ??? Sulfa (Sulfonamide Antibiotics) Hives     Other reaction(s): HIVES  Other reaction(s): HIVES   ??? Sulfasalazine      Other reaction(s): HIVES   ??? Enbrel [Etanercept] Rash       Changes to allergies: No    SPECIALTY MEDICATION ADHERENCE         Medication Adherence    Patient reported X missed doses in the last month: 1  Specialty Medication: Cosentyx 150mg /ml 2 pens q 28days  Patient is on additional specialty medications: Yes  Additional Specialty Medications: Rasuvo 20mg  q week   Patient Reported Additional Medication X Missed Doses in the Last Month: 0  Patient is on more than two specialty medications: No  Informant: patient          Specialty medication(s) dose(s) confirmed: Regimen is correct and unchanged.     Are there any concerns with adherence? No    Adherence counseling provided? Not needed    CLINICAL MANAGEMENT AND INTERVENTION      Clinical Benefit Assessment:    Do you feel the medicine is effective or helping your condition? Yes    Clinical Benefit counseling provided? Progress note from 8/3 shows evidence of clinical benefit    Adverse Effects Assessment:    Are you experiencing any side effects? No    Are you experiencing difficulty administering your medicine? No    Quality of Life Assessment:    Quality of Life    Rheumatology  1. What impact has your specialty medication  had on the reduction of your daily pain level?: Tremendous  2. What impact has your specialty medication had on your ability to complete daily tasks (prepare meals, get dressed, etc...)?Marland Kitchen Tremendous  Oncology  Dermatology  Cystic Fibrosis              Have you discussed this with your provider? Not needed    Acute Infection Status:    Acute infections noted within Epic:  No active infections  Patient reported infection: None    Therapy Appropriateness:    Is therapy appropriate? Yes, therapy is appropriate and should be continued    DISEASE/MEDICATION-SPECIFIC INFORMATION      For patients on injectable medications: Patient currently has 2  doses left.  Next injection is scheduled for 8/23 and 8/30.   Cosentyx:  Has 2 pens on hand.  She skipped 1 month in June because her son had COVID.  She will use remaining pens of end August and end of Sept.  Patient will need again mid-Oct PATIENT SPECIFIC NEEDS     - Does the patient have any physical, cognitive, or cultural barriers? No    - Is the patient high risk? No    - Does the patient require a Care Management Plan? No     - Does the patient require physician intervention or other additional services (i.e. nutrition, smoking cessation, social work)? No      SHIPPING     Specialty Medication(s) to be Shipped:   Inflammatory Disorders: Rasuvo    Other medication(s) to be shipped: leucovorin     Cosentyx will need again for end of Oct     Changes to insurance: No    Delivery Scheduled: Yes, Expected medication delivery date: 8/31.     Medication will be delivered via Same Day Courier to the confirmed prescription address in [facility].    The patient will receive a drug information handout for each medication shipped and additional FDA Medication Guides as required.  Verified that patient has previously received a Conservation officer, historic buildings and a Surveyor, mining.    The patient or caregiver noted above participated in the development of this care plan and knows that they can request review of or adjustments to the care plan at any time.      All of the patient's questions and concerns have been addressed.    Julianne Rice   [facility] Specialty Pharmacist

## 2021-08-19 MED FILL — LEUCOVORIN CALCIUM 5 MG TABLET: ORAL | 84 days supply | Qty: 48 | Fill #1

## 2021-08-19 MED FILL — RASUVO (PF) 20 MG/0.4 ML SUBCUTANEOUS AUTO-INJECTOR: SUBCUTANEOUS | 28 days supply | Qty: 1.6 | Fill #4

## 2021-08-25 DIAGNOSIS — L4050 Arthropathic psoriasis, unspecified: Principal | ICD-10-CM

## 2021-08-25 DIAGNOSIS — D849 Immunodeficiency, unspecified: Principal | ICD-10-CM

## 2021-09-10 DIAGNOSIS — F419 Anxiety disorder, unspecified: Principal | ICD-10-CM

## 2021-09-10 MED ORDER — ESCITALOPRAM 10 MG TABLET
ORAL_TABLET | Freq: Every day | ORAL | 3 refills | 90 days | Status: CP
Start: 2021-09-10 — End: 2022-09-10
  Filled 2021-09-17: qty 90, 90d supply, fill #0

## 2021-09-10 NOTE — Unmapped (Signed)
[facility] Refill Coordination Note    Specialty Medication(s) to be Shipped:   Inflammatory Disorders: Cosentyx and Rasuvo    Other medication(s) to be shipped: escitalopram     Mary Waters, DOB: 1966-09-09  Phone: [phone] (home)       All above HIPAA information was verified with patient.     Was a Nurse, learning disability used for this call? No    Completed refill call assessment today to schedule patient's medication shipment from the [facility] [phone]).  All relevant notes have been reviewed.     Specialty medication(s) and dose(s) confirmed: Regimen is correct and unchanged.   Changes to medications: Coriann reports no changes at this time.  Changes to insurance: No  New side effects reported not previously addressed with a pharmacist or physician: None reported  Questions for the pharmacist: No    Confirmed patient received a Conservation officer, historic buildings and a Surveyor, mining with first shipment. The patient will receive a drug information handout for each medication shipped and additional FDA Medication Guides as required.       DISEASE/MEDICATION-SPECIFIC INFORMATION        For patients on injectable medications: Patient currently has 1 (both Cosentyx and Rasuvo) doses left.  Next injection is scheduled for 9/27 (both Cosentyx and Rasuvo).    SPECIALTY MEDICATION ADHERENCE     Medication Adherence    Patient reported X missed doses in the last month: 0  Specialty Medication: Cosentyx pen 150mg /ml  Patient is on additional specialty medications: Yes  Additional Specialty Medications: Rasuvo pf 20mg /0.22ml  Patient Reported Additional Medication X Missed Doses in the Last Month: 0  Patient is on more than two specialty medications: No  Any gaps in refill history greater than 2 weeks in the last 3 months: no  Demonstrates understanding of importance of adherence: yes  Informant: patient              Were doses missed due to medication being on hold? No        REFERRAL TO PHARMACIST     Referral to the pharmacist: Not needed      [facility]     Shipping address confirmed in Epic.     Delivery Scheduled: Yes, Expected medication delivery date: 9/29.     Medication will be delivered via Same Day Courier to the prescription address in Epic WAM.    Westley Gambles   [facility] Specialty Technician

## 2021-09-10 NOTE — Unmapped (Signed)
Patient is requesting the following refill  Requested Prescriptions     Pending Prescriptions Disp Refills   ??? escitalopram oxalate (LEXAPRO) 10 MG tablet 90 tablet 3     Sig: Take 1 tablet (10 mg total) by mouth daily.       Recent Visits  Date Type Provider Dept   03/25/21 Office Visit Keri Rosita Fire, FNP [facility]   Showing recent visits within past 365 days with a meds authorizing provider and meeting all other requirements  Future Appointments  No visits were found meeting these conditions.  Showing future appointments within next 365 days with a meds authorizing provider and meeting all other requirements

## 2021-09-17 MED FILL — RASUVO (PF) 20 MG/0.4 ML SUBCUTANEOUS AUTO-INJECTOR: SUBCUTANEOUS | 28 days supply | Qty: 1.6 | Fill #5

## 2021-09-17 MED FILL — COSENTYX PEN 300 MG/2 PENS (150 MG/ML) SUBCUTANEOUS: SUBCUTANEOUS | 84 days supply | Qty: 6 | Fill #3

## 2021-10-21 ENCOUNTER — Ambulatory Visit: Admit: 2021-10-21 | Discharge: 2021-10-22 | Payer: MEDICARE

## 2021-10-21 DIAGNOSIS — Z79631 Methotrexate, long term, current use: Principal | ICD-10-CM

## 2021-10-21 DIAGNOSIS — L4050 Arthropathic psoriasis, unspecified: Principal | ICD-10-CM

## 2021-10-21 DIAGNOSIS — L409 Psoriasis, unspecified: Principal | ICD-10-CM

## 2021-10-21 DIAGNOSIS — R700 Elevated erythrocyte sedimentation rate: Principal | ICD-10-CM

## 2021-10-21 DIAGNOSIS — Z79899 Other long term (current) drug therapy: Principal | ICD-10-CM

## 2021-10-21 LAB — HEMOGLOBIN A1C
ESTIMATED AVERAGE GLUCOSE: 128 mg/dL
HEMOGLOBIN A1C: 6.1 % — ABNORMAL HIGH (ref 4.8–5.6)

## 2021-10-21 LAB — CBC W/ AUTO DIFF
BASOPHILS ABSOLUTE COUNT: 0 10*9/L (ref 0.0–0.1)
BASOPHILS RELATIVE PERCENT: 0.8 %
EOSINOPHILS ABSOLUTE COUNT: 0.1 10*9/L (ref 0.0–0.5)
EOSINOPHILS RELATIVE PERCENT: 1.7 %
HEMATOCRIT: 36.4 % (ref 34.0–44.0)
HEMOGLOBIN: 11.8 g/dL (ref 11.3–14.9)
LYMPHOCYTES ABSOLUTE COUNT: 2.3 10*9/L (ref 1.1–3.6)
LYMPHOCYTES RELATIVE PERCENT: 42.8 %
MEAN CORPUSCULAR HEMOGLOBIN CONC: 32.5 g/dL (ref 32.0–36.0)
MEAN CORPUSCULAR HEMOGLOBIN: 29.6 pg (ref 25.9–32.4)
MEAN CORPUSCULAR VOLUME: 90.9 fL (ref 77.6–95.7)
MEAN PLATELET VOLUME: 7.1 fL (ref 6.8–10.7)
MONOCYTES ABSOLUTE COUNT: 0.5 10*9/L (ref 0.3–0.8)
MONOCYTES RELATIVE PERCENT: 10 %
NEUTROPHILS ABSOLUTE COUNT: 2.4 10*9/L (ref 1.8–7.8)
NEUTROPHILS RELATIVE PERCENT: 44.7 %
PLATELET COUNT: 303 10*9/L (ref 150–450)
RED BLOOD CELL COUNT: 4 10*12/L (ref 3.95–5.13)
RED CELL DISTRIBUTION WIDTH: 13.2 % (ref 12.2–15.2)
WBC ADJUSTED: 5.3 10*9/L (ref 3.6–11.2)

## 2021-10-21 LAB — AST: AST (SGOT): 12 U/L (ref <=34)

## 2021-10-21 LAB — CREATININE
CREATININE: 0.77 mg/dL
EGFR CKD-EPI (2021) FEMALE: >90 mL/min/{1.73_m2} (ref >=60)

## 2021-10-21 LAB — ALT: ALT (SGPT): <7 U/L — ABNORMAL LOW (ref 10–49)

## 2021-10-21 LAB — C-REACTIVE PROTEIN: C-REACTIVE PROTEIN: 25 mg/L — ABNORMAL HIGH (ref <=10.0)

## 2021-10-21 LAB — SEDIMENTATION RATE: ERYTHROCYTE SEDIMENTATION RATE: 83 mm/h — ABNORMAL HIGH (ref 0–30)

## 2021-10-21 MED ORDER — COSENTYX PEN 300 MG/2 PENS (150 MG/ML) SUBCUTANEOUS
SUBCUTANEOUS | 3 refills | 84 days | Status: CP
Start: 2021-10-21 — End: ?
  Filled 2021-11-17: qty 6, 84d supply, fill #0

## 2021-10-21 NOTE — Unmapped (Addendum)
Will give you flu vaccine today.    Please get the new COVID-19 bivalent booster in 2-4 weeks.     After each vaccine shot, skip your Rasuvo for one dose.     No change in regimen. Exam is stable. Monitoring labs today.

## 2021-10-21 NOTE — Unmapped (Signed)
Patient Name: Mary Waters  PCP: ??Noralyn Pick, FNP  Source of History: patient and records  Date of Visit: 10/21/21 12:50 PM    Chief Compliant: follow-up for PsA    PRIOR RHEUMATOLOGIC HISTORY:?? Psoriatis arthritis.   Established care in September 2016.  Ultrasound in 2016 showing small PIP joint effusions, presence of enthesophytes, and achilles tendon inflammation/tendinitis consistent with an underlying spondyloarthropathy such as psoriatic arthritis.    Humira 2016-06/2016 with loss of efficacy  Methotrexate added 01/2016 for persistent peripheral arthritis; switched to auto-injector 07/2019, dose increased February 2022 to 20 mg once a week.  Enbrel 06/2016-09/2017 with loss of efficacy  Cimzia 09/2017 to 07/2019.   Cosentyx  07/2019 -     Never pursued surgical decompression for cervical myelopathy. Has numbness and paresthesias in both hands.     Current med regimen: cosentyx 300 mg q 28 days, rasuvo 20 mg qwk, leucovorin 20 mg q wk.     HPI: Mary Waters is a 55 y.o. female who presents for her follow-up for psoriatic arthritis with known cervical myelopathy. At follow-up in in June 2021 with concerns for decline in Schober but because she noted improvement on Cosentyx continued therapy unchanged. Saw Carlus Pavlov [facility] in October 2021 and reported good disease control. Last seen by me in February 2022 and had some increased pain and stiffness in hands so she agreed to increase Rasuvo to 20 mg once a week. Stable at more recent follow-up in August 2022 with Carlus Pavlov [facility]. She presents to clinic in person today.    Today, she reports compliance with Cosentyx 300 mg once a month, Rasuvo 20 mg weekly and leucovorin 20 mg once a week after the Rasuvo. She reports fatigue all the time. She also notes dyspnea on exertion. She is not sleeping well. Waking up around 3 am, 4 am, 5 am. She has trouble falling back asleep. She reports no active psoriasis. She reports pain at her right knee with stiffness. She reports right knee pain is most severe when she puts weight on it or during transitions from sitting to standing.  She reports pain in her right shoulder. She reports hands and feet are doing well today.  She continues to have numbness and paresthesias in her hands. She feels like the exercises make her hands worse.     ROS??: Attests to the above, otherwise, review of all other systems is negative.   ????  Past Medical and Surgical History:  ??  Patient Active Problem List    Diagnosis Date Noted   ??? Immunocompromised (CMS-HCC) 07/22/2021   ??? Psoriatic arthritis (CMS-HCC) 01/07/2016   ??? Menopause 08/07/2015   ??? History of tobacco use 10/23/2014   ??? Fibromyalgia 04/01/2014   ??? Carpal tunnel syndrome of right wrist 04/01/2014   ??? GERD (gastroesophageal reflux disease) 04/01/2014   ??? Chronic pain syndrome 04/01/2014   ??? Morbid obesity due to excess calories (CMS-HCC) 04/01/2014   ??? Psoriasis 04/01/2014   ??? Allergic rhinitis 02/04/2014   ??? Cataracts, both eyes 10/03/2013   ??? Health care maintenance 12/23/2010   ??? IBS (irritable bowel syndrome) 12/20/2008     Past Surgical History:   Procedure Laterality Date   ??? CESAREAN SECTION     ??? DILATION AND CURETTAGE OF UTERUS     ??? HYSTERECTOMY  2001    TAH   ??? KNEE SURGERY     ??? TOTAL ABDOMINAL HYSTERECTOMY       Allergies:   ??  Allergies   Allergen Reactions   ??? Prednisone Hives   ??? Sulfa (Sulfonamide Antibiotics) Hives     Other reaction(s): HIVES  Other reaction(s): HIVES   ??? Sulfasalazine      Other reaction(s): HIVES   ??? Enbrel [Etanercept] Rash     Current Outpatient Medications:  ??  Current Outpatient Medications on File Prior to Visit   Medication Sig Dispense Refill   ??? acetaminophen (TYLENOL) 500 MG tablet Take 1,000 mg by mouth every four (4) hours as needed.      ??? baclofen (LIORESAL) 10 MG tablet Take 1 tablet (10 mg total) by mouth Three (3) times a day as needed for muscle spasms. 270 tablet 3   ??? clindamycin (CLEOCIN T) 1 % lotion Apply topically to affected bumps two (2) times a  until resolved. 60 mL 5   ??? escitalopram oxalate (LEXAPRO) 10 MG tablet Take 1 tablet (10 mg total) by mouth daily. 90 tablet 3   ??? leuCOVorin (WELLCOVORIN) 5 mg tablet Take 4 tablets (20 mg total) by mouth every seven (7) days. Take 12-24 hours after methotrexate. 48 tablet 3   ??? menthol/camphor (CAMPHOR-MENTHOL) 4-10 % Crea Apply topically.     ??? methotrexate, PF, (RASUVO, PF,) 20 mg/0.4 mL AtIn Inject the contents of 1 pen (20 mg) under the skin every seven (7) days. 1.6 mL 11   ??? secukinumab (COSENTYX PEN, 2 PENS,) 150 mg/mL PnIj injection Inject the contents of 2 pens (300 mg) under the skin every twenty-eight (28) days. 6 mL 3     No current facility-administered medications on file prior to visit.   ????  ??  Immunization History   Administered Date(s) Administered   ??? COVID-19 VACC,MRNA,(PFIZER)(PF)(IM) 03/02/2020, 03/22/2020, 09/16/2020   ??? COVID-19 VACCINE,MRNA(MODERNA)(PF)(IM) 07/22/2021   ??? Influenza Vaccine Quad (IIV4 PF) 31mo+ injectable 09/26/2015, 10/14/2016, 12/02/2017, 11/07/2018, 10/03/2019, 10/08/2020, 10/21/2021   ??? PNEUMOCOCCAL POLYSACCHARIDE 23 03/04/2016   ??? PPD Test 09/12/2013   ??? Pneumococcal Conjugate 13-Valent 10/23/2015   ??? Rubella 01/10/1979   ??? TdaP 09/12/2013     ????  PHYSICAL EXAM??  Vital signs: BP 128/82 (BP Site: L Arm, BP Position: Sitting, BP Cuff Size: Large)  - Pulse 90  - Temp 36.5 ??C (97.7 ??F) (Temporal)  - Wt (!) 104.5 kg (230 lb 6.4 oz)  - BMI 35.03 kg/m?? Body mass index is 35.03 kg/m??.  Gen: Well-developed, well-nourished adult in no apparent distress. Normocephalic with no external signs of trauma. Pleasant and cooperative. AOx4.?  HEENT: PERRLA, EOMI, mask in place  Lungs: Broad chest excursion with good air movement. CTAB without wheezing/rhonchi/rales. ????  CV: RRR, Normal S1/S2, No murmurs/rubs/gallops heard.  PV: Warm, 2+ radial and pedal pulses, no C/C/E.????  Neuro: Good comprehension/cognition. CN 2-12 intact.  Muscle strength 5/5 in all extremities. Gait normal.????  Comprehensive Musculoskeletal Examination:????  ?? Jaw, neck without limited ROM.????  ?? Shoulders, elbows, wrists, hands, fingers: No MCP, PIP, or DIP swelling or tenderness today. Unable to make tight fist on the right but grip strength intact. Wrists, elbows and shoulders wnl  ?? Occiput to wall 0 cm, chest wall expansion 5 cm, Modified Schober 15 cm to 17.5 cm, unchanged from prior with me in February 22  ?? Bilateral hips with good ROM  ?? Knee, ankles, feet, toes: No deformity, erythema, warmth, swelling, effusion, tenderness, limited ROM. Knee stable to valgus/varus stress and anterior/posterior drawer sign.???? Unable to elicit right knee pain with palpation and ROM  Skin: No psoriasis noted  LABORATORY-monitoring labs obtained during visit for medication monitoring with no signs for toxicity. Persistently elevated esr sed rate. Will check SPEP and light chains. Will check hgba1c.  Recent Results (from the past 168 hour(s))   Creatinine    Collection Time: 10/21/21  1:28 PM   Result Value Ref Range    Creatinine 0.77 0.60 - 0.80 mg/dL    eGFR CKD-EPI (1610) Female >90 >=60 mL/min/1.12m2   ALT    Collection Time: 10/21/21  1:28 PM   Result Value Ref Range    ALT <7 (L) 10 - 49 U/L   AST    Collection Time: 10/21/21  1:28 PM   Result Value Ref Range    AST 12 <=34 U/L   C-reactive protein    Collection Time: 10/21/21  1:28 PM   Result Value Ref Range    CRP 25.0 (H) <=10.0 mg/L   Sedimentation Rate    Collection Time: 10/21/21  1:28 PM   Result Value Ref Range    Sed Rate 83 (H) 0 - 30 mm/h   CBC w/ Differential    Collection Time: 10/21/21  1:28 PM   Result Value Ref Range    WBC 5.3 3.6 - 11.2 10*9/L    RBC 4.00 3.95 - 5.13 10*12/L    HGB 11.8 11.3 - 14.9 g/dL    HCT 96.0 45.4 - 09.8 %    MCV 90.9 77.6 - 95.7 fL    MCH 29.6 25.9 - 32.4 pg    MCHC 32.5 32.0 - 36.0 g/dL    RDW 11.9 14.7 - 82.9 %    MPV 7.1 6.8 - 10.7 fL    Platelet 303 150 - 450 10*9/L    Neutrophils % 44.7 % Lymphocytes % 42.8 %    Monocytes % 10.0 %    Eosinophils % 1.7 %    Basophils % 0.8 %    Absolute Neutrophils 2.4 1.8 - 7.8 10*9/L    Absolute Lymphocytes 2.3 1.1 - 3.6 10*9/L    Absolute Monocytes 0.5 0.3 - 0.8 10*9/L    Absolute Eosinophils 0.1 0.0 - 0.5 10*9/L    Absolute Basophils 0.0 0.0 - 0.1 10*9/L       ????IMAGING???? - no recent    ????GENERAL SUMMARY AND IMPRESSION: ????  ????  In summary, the patient is a 55 y.o. female here for follow-up for psoriatic arthritis on secukinumab 300 mg monthly injections with methotrexate 20 mg subcutaneous weekly injections followed by leucovorin 20 mg once a week. PsA appears stable, although she continues to have a slightly reduced ROM in right hand with reduced grip strength. Possibly related to her cervical myelopathy vs PsA. Axial exam stable. No change in management today for PsA. Will check monitoring labs with results returning showing persistently elevated ESR sed rate. Given this, add on SPEP and light chains as well as HgBA1c to assess for other etiologies that might contribute to this lab abnormality. Seasonal flu vaccine given today and advised her to get new bivalent COVID-19 vaccine in 2-4 weeks. Advised her to hold methotrexate/Rasuvo for one dose after each vaccine shot. Follow-up in 3-4 months with Carlus Pavlov [facility] and 7-8 months with myself or sooner pending lab studies.     RECOMMENDATIONS: ????  ????   Diagnosis ICD-10-CM Associated Orders   1. Psoriatic arthritis (CMS-HCC)  L40.50 secukinumab (COSENTYX PEN, 2 PENS,) 150 mg/mL PnIj injection     CBC w/ Differential     Creatinine     ALT  AST     C-reactive protein     Sedimentation Rate     25 OH Vit D     Serum Free Light Chains     SPEP  Serum Protein Electrophoresis   2. Psoriasis  L40.9 secukinumab (COSENTYX PEN, 2 PENS,) 150 mg/mL PnIj injection   3. Methotrexate, long term, current use  Z79.631 CBC w/ Differential     Creatinine     ALT     AST     C-reactive protein     Sedimentation Rate     25 OH Vit D   4. Elevated sed rate  R70.0 Serum Free Light Chains     SPEP  Serum Protein Electrophoresis     Hemoglobin A1c   5. Other long term (current) drug therapy   Z79.899 Hemoglobin A1c     Patient Instructions   Will give you flu vaccine today.    Please get the new COVID-19 bivalent booster in 2-4 weeks.     After each vaccine shot, skip your Rasuvo for one dose.     No change in regimen. Exam is stable. Monitoring labs today.    The patient indicates understanding of these issues and agrees to the plan as outlined above.  Contact information provided for any concerns or questions in the interim.  ?  I personally spent 30 minutes face-to-face and non-face-to-face in the care of this patient, which includes all pre, intra, and post visit time on the date of service.      Arinze Rivadeneira C. Scarlette Calico, MD, PhD  Assistant Professor of Medicine  Department of Medicine/Division of Rheumatology  [facility]  8:37 PM

## 2021-10-23 LAB — SERUM FREE LIGHT CHAINS
K/L FLC RATIO: 1.75 — ABNORMAL HIGH (ref 0.26–1.65)
KAPPA FREE,SERUM: 2.69 mg/dL — ABNORMAL HIGH (ref 0.33–1.94)
LAMBDA FREE, SER: 1.54 mg/dL (ref 0.57–2.63)

## 2021-10-23 LAB — VITAMIN D 25 HYDROXY: VITAMIN D, TOTAL (25OH): 29.9 ng/mL (ref 20.0–80.0)

## 2021-10-23 NOTE — Unmapped (Signed)
The [facility] has made a second and final attempt to reach this patient to refill the following medication:Rasuvo.      We have left voicemails on the following phone numbers: [phone] and have sent a MyChart message.    Dates contacted: 10/27, 11/4  Last scheduled delivery: 9/29    The patient may be at risk of non-compliance with this medication. The patient should call the [facility] at [phone]  Option 4, then Option 2 (all other specialty patients) to refill medication.    Olga Millers   [facility] Specialty Technician

## 2021-10-26 LAB — PROTEIN ELECTROPHORESIS, SERUM
ALBUMIN (SPE): 3.8 g/dL (ref 3.5–5.0)
ALPHA-1 GLOBULIN: 0.4 g/dL (ref 0.2–0.5)
ALPHA-2 GLOBULIN: 1 g/dL (ref 0.5–1.1)
BETA-1 GLOBULIN: 0.5 g/dL (ref 0.3–0.6)
BETA-2 GLOBULIN: 0.6 g/dL (ref 0.2–0.6)
GAMMAGLOBULIN: 1.2 g/dL (ref 0.5–1.5)
PROTEIN TOTAL: 7.5 g/dL

## 2021-11-09 NOTE — Unmapped (Signed)
[facility] RHEUMATOLOGY CLINIC - PHARMACIST NOTES    [name] is currently filling Rasuvo through the [facility]. The [facility] have made multiple unsuccessful attempts to refill her medication(s) the past few weeks.  Last shipment went out on 09/17/21 for a 1 month supply.      Attempt to reach patient from clinic for follow up today. Unable to get in touch with patient, left voicemail requesting call back to [facility] at [phone], option 4.     Chelsea Aus

## 2021-11-12 NOTE — Unmapped (Signed)
[facility] Refill Coordination Note    Specialty Medication(s) to be Shipped:   Inflammatory Disorders: Cosentyx and Rasuvo    Other medication(s) to be shipped:  leuCOVorin 5 mg tablet (WELLCOVORIN)     Mary Waters, DOB: 08-08-1966  Phone: [phone] (home)       All above HIPAA information was verified with patient.     Was a Nurse, learning disability used for this call? No    Completed refill call assessment today to schedule patient's medication shipment from the [facility] [phone]).  All relevant notes have been reviewed.     Specialty medication(s) and dose(s) confirmed: Regimen is correct and unchanged.   Changes to medications: Zenya reports no changes at this time.  Changes to insurance: No  New side effects reported not previously addressed with a pharmacist or physician: None reported  Questions for the pharmacist: No    Confirmed patient received a Conservation officer, historic buildings and a Surveyor, mining with first shipment. The patient will receive a drug information handout for each medication shipped and additional FDA Medication Guides as required.       DISEASE/MEDICATION-SPECIFIC INFORMATION        For patients on injectable medications: Patient currently has 1 doses left.  Next injection is scheduled for 11/18/21.     SPECIALTY MEDICATION ADHERENCE     Medication Adherence    Patient reported X missed doses in the last month: 0  Specialty Medication: secukinumab (COSENTYX PEN, 2 PENS,) 150 mg/mL PnIj injection  Patient is on additional specialty medications: Yes  Additional Specialty Medications: methotrexate, PF, (RASUVO, PF,) 20 mg/0.4 mL AtIn  Patient Reported Additional Medication X Missed Doses in the Last Month: 0  Patient is on more than two specialty medications: No       Were doses missed due to medication being on hold? No  COSENTYX 0 doses on hand  RASUVO: 1 doses on hand       REFERRAL TO PHARMACIST     Referral to the pharmacist: Not needed      [facility]     Shipping address confirmed in Epic.     Delivery Scheduled: Yes, Expected medication delivery date: 11/17/21.     Medication will be delivered via Same Day Courier to the prescription address in Epic WAM.    Mary Waters   [facility] Specialty Technician

## 2021-11-17 MED FILL — RASUVO (PF) 20 MG/0.4 ML SUBCUTANEOUS AUTO-INJECTOR: SUBCUTANEOUS | 28 days supply | Qty: 1.6 | Fill #6

## 2021-11-17 MED FILL — LEUCOVORIN CALCIUM 5 MG TABLET: ORAL | 84 days supply | Qty: 48 | Fill #2

## 2021-12-07 ENCOUNTER — Ambulatory Visit: Admit: 2021-12-07 | Discharge: 2021-12-08 | Payer: MEDICARE

## 2021-12-07 MED ORDER — CLOBETASOL 0.05 % SCALP SOLUTION
Freq: Two times a day (BID) | TOPICAL | 3 refills | 0.00000 days | Status: CP
Start: 2021-12-07 — End: 2022-12-07
  Filled 2021-12-11: qty 50, 25d supply, fill #0

## 2021-12-07 MED ORDER — KETOCONAZOLE 2 % SHAMPOO
TOPICAL | 10 refills | 0.00000 days | Status: CP
Start: 2021-12-07 — End: ?
  Filled 2021-12-11: qty 120, 30d supply, fill #0

## 2021-12-07 MED ORDER — CLINDAMYCIN 1 % LOTION
Freq: Two times a day (BID) | TOPICAL | 5 refills | 0.00000 days | Status: CP
Start: 2021-12-07 — End: ?
  Filled 2021-12-11: qty 60, 30d supply, fill #0

## 2021-12-07 NOTE — Unmapped (Signed)
[facility]     Over the counter keratolytics for the bumps on the skin:  - over-the-counter creams: CeraVe-SA (~$20), AmLactin (~$13), Eucerin Roughness Relief (~$10), Gold Bond Rough and Bumpy(~$10), KP Duty (~$50)

## 2021-12-07 NOTE — Unmapped (Signed)
Dermatology Note     Assessment and Plan:      Psoriasis- palmoplantar involvement and PsA  - Well controlled on Secukinumab (Cosentyx) managed by Rheumatologist for PsA.  - Continue Secukinumab (Cosentyx) and follow-up with Rheumatologist  - Continue using topical steroids PRN for flares. Patient expressed that no refill(s) needed today.     Seborrheic dermatitis  - Diagnosis, treatment options, benefit, and side effects were discussed with the patient.   - Goal is control rather than cure.  - Treatment plan: topical steroid and medicated shampoo.  - ketoconazole (NIZORAL) 2 % shampoo; Apply to scalp for 5-10 minutes, then wash off. Can use 1-2 times a week.  - clobetasoL (TEMOVATE) 0.05 % external solution; Apply topically Two (2) times a day. Use for irritation/itch on scalp until it resolves, then discontinue.  - The patient expressed understanding and agreement to the plan as above.    Hidradenitis suppurativa Hurley Stage 1  - Areas of involvement: Axillae and groin  - Based on the clinical history and physical exam findings, the differential diagnosis that is favored are Doreene Adas stage 1 hidradenitis suppurativa.  - Discussed treatment options with use of antibiotics (oral vs topical) and antiseptic washes, but the patient referred the condition was well controlled with use of BPO wash (panoxyl) and clindamycin lotion.  -  Encouraged to return if uncontrolled or progressing disease.   - Sent refill for clinda lotion as requested.  - clindamycin (CLEOCIN T) 1 % lotion; Apply topically to affected bumps two (2) times a  until resolved.  - The patient expressed understanding and agreement to the plan as above.    Keratosis pilaris (rash of concern)  - Education; discussed no durable response treatments/cure; reviewed waxing and waning nature  - Discussed treating as aggressively as patient bother indicates  - Discussed emollient and gentle skin care at length; discussed avoidance of exfoliation  - Discussed potential keratolytic to limited BSA if desired: CeraVe-SA, AmLactin, LacHydrin, Eucerin Roughness Relief Lotion  - Informational material provided on examples of over-the-counter keratolytics in the After Visit Summary.    The patient was advised to call for an appointment should any new, changing, or symptomatic lesions develop.     RTC: Return in about 1 year (around 12/07/2022) for In-person. or sooner as needed   _________________________________________________________________      Chief Complaint     Chief Complaint   Patient presents with   ??? Follow-up     Medication refills-concerns of small bumps on her arms and stomach, x 3months, no tx        HPI     Mary Waters is a 55 y.o. female who presents as a returning patient (last seen 10/19/2019) to [facility] for follow up of recurrent boils and refills of medications. The patient also needs refills for Seborrheic Dermatitis. She has also been getting a rash for a couple of weeks that she had not noticed before. She refers pruritus and only treatment has been BPO wash, with no improvement.    The patient denies any other new or changing lesions or areas of concern.     Pertinent Past Medical History     No history of skin cancer    Problem List        Musculoskeletal and Integument    Psoriasis    Relevant Medications    clobetasoL (TEMOVATE) 0.05 % external solution    Seborrheic dermatitis - Primary    Relevant Medications  ketoconazole (NIZORAL) 2 % shampoo    clobetasoL (TEMOVATE) 0.05 % external solution    Hidradenitis suppurativa    Relevant Medications    clindamycin (CLEOCIN T) 1 % lotion    clobetasoL (TEMOVATE) 0.05 % external solution    Keratosis pilaris    Relevant Medications    clobetasoL (TEMOVATE) 0.05 % external solution     Psoriasis  Psoriatic Arthritis    Past Medical History, Family History, Social History, Medication List, Allergies, and Problem List were reviewed in the rooming section of Epic.     ROS: Other than symptoms mentioned in the HPI, no fevers, chills, or other skin complaints    Physical Examination     GENERAL: Well-appearing female in no acute distress, resting comfortably.  NEURO: Alert and oriented, answers questions appropriately  PSYCH: Normal mood and affect  SKIN: Examination of the axillae, scalp, abdomen, back, upper back, bilateral upper extremities, hands, nails and fingernails was performed     - No lesions or scars on the bilateral axillae  - Spiny follicular papules with mild surrounding erythema located on the upper arms and bilateral flanks (Rash of concern)  - Patchy erythema and diffuse yellowish greasy scaling of the scalp  - No lesions or fissures on the bilateral hands    All areas not commented on are within normal limits or unremarkable    (Approved Template 09/01/2020)

## 2021-12-08 NOTE — Unmapped (Signed)
[facility] Refill Coordination Note    Specialty Medication(s) to be Shipped:   Inflammatory Disorders: Rasuvo    Other medication(s) to be shipped: No additional medications requested for fill at this time     Mary Waters, DOB: 1966-09-26  Phone: [phone] (home)       All above HIPAA information was verified with patient.     Was a Nurse, learning disability used for this call? No    Completed refill call assessment today to schedule patient's medication shipment from the [facility] [phone]).  All relevant notes have been reviewed.     Specialty medication(s) and dose(s) confirmed: Regimen is correct and unchanged.   Changes to medications: Mary Waters reports no changes at this time.  Changes to insurance: No  New side effects reported not previously addressed with a pharmacist or physician: None reported  Questions for the pharmacist: No    Confirmed patient received a Conservation officer, historic buildings and a Surveyor, mining with first shipment. The patient will receive a drug information handout for each medication shipped and additional FDA Medication Guides as required.       DISEASE/MEDICATION-SPECIFIC INFORMATION        For patients on injectable medications: Patient currently has 2 doses left.  Next injection is scheduled for 12/22.    SPECIALTY MEDICATION ADHERENCE     Medication Adherence    Patient reported X missed doses in the last month: 0  Specialty Medication: Rasuvo 20mg /0.75ml  Patient is on additional specialty medications: No  Patient is on more than two specialty medications: No  Any gaps in refill history greater than 2 weeks in the last 3 months: no  Informant: patient              Were doses missed due to medication being on hold? No    Rasuvo 20mg /0.33ml: Patient has 14 days of medication on hand    REFERRAL TO PHARMACIST     Referral to the pharmacist: Not needed      [facility]     Shipping address confirmed in Epic.     Delivery Scheduled: Yes, Expected medication delivery date: 12/28.     Medication will be delivered via Same Day Courier to the prescription address in Epic [place].    Mary Waters   [facility] Specialty Technician

## 2021-12-08 NOTE — Unmapped (Signed)
Returned call to patient after she had LM on nurse line asking why her mychart does not have notes and AVS from yesterday's visit yet.  Per patient, requested list of lotions she could buy OTC to help with keratosis pilaris. Gave list of lotions indicated in notes from yesterday's visit.  Patient appreciated the call.

## 2021-12-10 DIAGNOSIS — L4050 Arthropathic psoriasis, unspecified: Principal | ICD-10-CM

## 2021-12-10 DIAGNOSIS — Z79631 Methotrexate, long term, current use: Principal | ICD-10-CM

## 2021-12-10 NOTE — Unmapped (Signed)
Adding Clobetasol ext solution, Ketoconazole shampoo and Clindamycin lotion to work order. Confirmed new delivery date with patient as 12/22 via same day courier.

## 2021-12-11 MED FILL — RASUVO (PF) 20 MG/0.4 ML SUBCUTANEOUS AUTO-INJECTOR: SUBCUTANEOUS | 28 days supply | Qty: 1.6 | Fill #7

## 2021-12-30 DIAGNOSIS — L409 Psoriasis, unspecified: Principal | ICD-10-CM

## 2021-12-30 DIAGNOSIS — L4050 Arthropathic psoriasis, unspecified: Principal | ICD-10-CM

## 2022-01-11 NOTE — Unmapped (Signed)
The [facility] has made a third and final attempt to reach this patient to refill the following medication:Rasuvo.      We have left voicemails on the following phone numbers: [phone] and have sent a MyChart message.    Dates contacted: 1/11, 1/17, 1/23  Last scheduled delivery: 12/23 for 28 day supply    The patient may be at risk of non-compliance with this medication. The patient should call the [facility] at [phone]  Option 4, then Option 2 (all other specialty patients) to refill medication.    Julianne Rice   [facility] Specialty Pharmacist

## 2022-01-15 NOTE — Unmapped (Incomplete)
The [facility] RHEUM[facility] CLINIC - PHARMACIST NOTES  ??  [name] is currently filling Rasuvo through the [facility]. The [facility] have made multiple unsuccessful attempts to refill her medication(s) the past few weeks.  Last shipment went out on 12/11/21 for a 1 month supply.    ??  Attempt to reach patient from clinic for follow up today. Unable to get in touch with patient, left voicemail requesting call back to [facility] at [phone], option 4.   ??  Allyson Sabal  PharmD Candidate

## 2022-01-18 DIAGNOSIS — Z79631 Methotrexate, long term, current use: Principal | ICD-10-CM

## 2022-01-18 DIAGNOSIS — L4050 Arthropathic psoriasis, unspecified: Principal | ICD-10-CM

## 2022-01-18 MED ORDER — RASUVO (PF) 20 MG/0.4 ML SUBCUTANEOUS AUTO-INJECTOR
SUBCUTANEOUS | 3 refills | 84 days | Status: CP
Start: 2022-01-18 — End: ?
  Filled 2022-03-01: qty 4.8, 84d supply, fill #0

## 2022-01-18 NOTE — Unmapped (Addendum)
[facility] Clinical Assessment & Refill Coordination Note    Requested and Rx for 3 months of Rasuvo so we can sync up with Cosentyx refills.       Mary Waters, DOB: 11/23/1966  Phone: [phone] (home)     All above HIPAA information was verified with patient.      Was a Nurse, learning disability used for this call? No    Specialty Medication(s):   Inflammatory Disorders: Cosentyx and Rasuvo     Current Outpatient Medications   Medication Sig Dispense Refill   ??? acetaminophen (TYLENOL) 500 MG tablet Take 1,000 mg by mouth every four (4) hours as needed.      ??? baclofen (LIORESAL) 10 MG tablet Take 1 tablet (10 mg total) by mouth Three (3) times a day as needed for muscle spasms. 270 tablet 3   ??? clindamycin (CLEOCIN T) 1 % lotion Apply topically to affected bumps two (2) times a  until resolved. 60 mL 5   ??? clobetasoL (TEMOVATE) 0.05 % external solution Apply topically Two (2) times a day. Use for irritation/itch on scalp until it resolves, then discontinue. 50 mL 3   ??? escitalopram oxalate (LEXAPRO) 10 MG tablet Take 1 tablet (10 mg total) by mouth daily. 90 tablet 3   ??? ketoconazole (NIZORAL) 2 % shampoo Apply to scalp for 5-10 minutes, then wash off. Can use 1-2 times a week. 120 mL 10   ??? leuCOVorin (WELLCOVORIN) 5 mg tablet Take 4 tablets (20 mg total) by mouth every seven (7) days. Take 12-24 hours after methotrexate. 48 tablet 3   ??? menthol/camphor (CAMPHOR-MENTHOL) 4-10 % Crea Apply topically.     ??? methotrexate, PF, (RASUVO, PF,) 20 mg/0.4 mL AtIn Inject the contents of 1 pen (20 mg) under the skin every seven (7) days. 1.6 mL 11   ??? secukinumab (COSENTYX PEN, 2 PENS,) 150 mg/mL PnIj injection Inject the contents of 2 pens (300 mg) under the skin every twenty-eight (28) days. 6 mL 3     No current facility-administered medications for this visit.        Changes to medications: Mary Waters reports no changes at this time.    Allergies   Allergen Reactions   ??? Prednisone Hives   ??? Sulfa (Sulfonamide Antibiotics) Hives     Other reaction(s): HIVES  Other reaction(s): HIVES   ??? Sulfasalazine      Other reaction(s): HIVES   ??? Enbrel [Etanercept] Rash       Changes to allergies: No    SPECIALTY MEDICATION ADHERENCE            Specialty medication(s) dose(s) confirmed: Regimen is correct and unchanged.     Are there any concerns with adherence? No    Adherence counseling provided? Not needed.  Patient states she has 5 pens of Rasuvo on hand that she has collected from previously holding during  covid infections/vaccine    CLINICAL MANAGEMENT AND INTERVENTION      Clinical Benefit Assessment:    Do you feel the medicine is effective or helping your condition? Yes    Clinical Benefit counseling provided? Progress note from 11/2 shows evidence of clinical benefit    Adverse Effects Assessment:    Are you experiencing any side effects? No    Are you experiencing difficulty administering your medicine? No    Quality of Life Assessment:    Quality of Life    Rheumatology  Oncology  Dermatology  Cystic Fibrosis  How many days over the past month did your condition  keep you from your normal activities? For example, brushing your teeth or getting up in the morning. 0    Have you discussed this with your provider? Not needed    Acute Infection Status:    Acute infections noted within Epic:  No active infections  Patient reported infection: None    Therapy Appropriateness:    Is therapy appropriate and patient progressing towards therapeutic goals? Yes, therapy is appropriate and should be continued    DISEASE/MEDICATION-SPECIFIC INFORMATION      For patients on injectable medications: Patient currently has 5 doses left.  Next injection is scheduled for Thursday 2/2.   She has 2 syringes of Cosentyx and will injection soon, not sure when     PATIENT SPECIFIC NEEDS     - Does the patient have any physical, cognitive, or cultural barriers? No    - Is the patient high risk? No    - Does the patient require a Care Management Plan? No     SOCIAL DETERMINANTS OF HEALTH     At the [facility], we have learned that life circumstances - like trouble affording food, housing, utilities, or transportation can affect the health of many of our patients.   That is why we wanted to ask: are you currently experiencing any life circumstances that are negatively impacting your health and/or quality of life? Patient declined to answer    Social Determinants of Health     Food Insecurity: Not on file   Tobacco Use: Medium Risk   ??? Smoking Tobacco Use: Former   ??? Smokeless Tobacco Use: Never   ??? Passive Exposure: Not on file   Transportation Needs: Not on file   Alcohol Use: Not on file   Housing/Utilities: Not on file   Substance Use: Not on file   Financial Resource Strain: Not on file   Physical Activity: Not on file   Health Literacy: Not on file   Stress: Not on file   Intimate Partner Violence: Not on file   Depression: Not at risk   ??? PHQ-2 Score: 0   Social Connections: Not on file       Would you be willing to receive help with any of the needs that you have identified today? Not applicable       SHIPPING     Specialty Medication(s) to be Shipped:        Changes to insurance: Yes: searched and added new insrance    Delivery Scheduled: Patient declined refill at this time due to patient has 5 pens on hand. Requests call back in 3 weeks     The patient will receive a drug information handout for each medication shipped and additional FDA Medication Guides as required.  Verified that patient has previously received a Conservation officer, historic buildings and a Surveyor, mining.    The patient or caregiver noted above participated in the development of this care plan and knows that they can request review of or adjustments to the care plan at any time.      All of the patient's questions and concerns have been addressed.    Julianne Rice   [facility] Specialty Pharmacist

## 2022-01-18 NOTE — Unmapped (Signed)
Methotrexate refill request patient would like 3 month script if possible  Last Visit Date: 10/21/2021  Next Visit Date: 02/18/2022    Lab Results   Component Value Date    ALT <7 (L) 10/21/2021    AST 12 10/21/2021    ALBUMIN 3.8 10/21/2021    CREATININE 0.77 10/21/2021     Lab Results   Component Value Date    WBC 5.3 10/21/2021    HGB 11.8 10/21/2021    HCT 36.4 10/21/2021    PLT 303 10/21/2021     Lab Results   Component Value Date    NEUTROPCT 44.7 10/21/2021    LYMPHOPCT 42.8 10/21/2021    MONOPCT 10.0 10/21/2021    EOSPCT 1.7 10/21/2021    BASOPCT 0.8 10/21/2021

## 2022-02-08 DIAGNOSIS — Z1231 Encounter for screening mammogram for malignant neoplasm of breast: Principal | ICD-10-CM

## 2022-02-08 NOTE — Unmapped (Signed)
Wants to schedule her mammogram at [facility] and they need order.  Due 03/02/2021.

## 2022-02-09 DIAGNOSIS — Z1231 Encounter for screening mammogram for malignant neoplasm of breast: Principal | ICD-10-CM

## 2022-02-09 NOTE — Unmapped (Signed)
Kelee notified.

## 2022-02-18 DIAGNOSIS — L4050 Arthropathic psoriasis, unspecified: Principal | ICD-10-CM

## 2022-02-18 DIAGNOSIS — Z79631 Methotrexate, long term, current use: Principal | ICD-10-CM

## 2022-02-18 MED ORDER — LEUCOVORIN CALCIUM 5 MG TABLET
ORAL_TABLET | ORAL | 3 refills | 84 days | Status: CP
Start: 2022-02-18 — End: ?
  Filled 2022-03-01: qty 48, 84d supply, fill #0

## 2022-02-18 NOTE — Unmapped (Signed)
[facility] Pharm[facility] Refill Coordination Note    Specialty Medication(s) to be Shipped:   Inflammatory Disorders: Cosentyx and Rasuvo    Other medication(s) to be shipped: Ketoconazole and Leucovorin     Mary Waters, DOB: 1966/07/18  Phone: [phone] (home)       All above HIPAA information was verified with patient.     Was a Nurse, learning disability used for this call? No    Completed refill call assessment today to schedule patient's medication shipment from the [facility] [phone]).  All relevant notes have been reviewed.     Specialty medication(s) and dose(s) confirmed: Regimen is correct and unchanged.   Changes to medications: Anaily reports no changes at this time.  Changes to insurance: No  New side effects reported not previously addressed with a pharmacist or physician: None reported  Questions for the pharmacist: No    Confirmed patient received a Conservation officer, historic buildings and a Surveyor, mining with first shipment. The patient will receive a drug information handout for each medication shipped and additional FDA Medication Guides as required.       DISEASE/MEDICATION-SPECIFIC INFORMATION        For patients on injectable medications: Patient currently has Cosentyx-0 Rasuvo-0 doses left.  Next injection is scheduled for Cosentyx-03/09/22 Rasuvo-02/25/22.    SPECIALTY MEDICATION ADHERENCE     Medication Adherence    Patient reported X missed doses in the last month: 0  Specialty Medication: Cosentyx 150MG /mL  Patient is on additional specialty medications: Yes  Additional Specialty Medications: Rasuvo 20mg /0.93mL  Patient Reported Additional Medication X Missed Doses in the Last Month: 0  Patient is on more than two specialty medications: No  Informant: patient              Were doses missed due to medication being on hold? No    Rasuvo 20mg /0.78mL: 0 days of medicine on hand   Cosentyx 150mg /mL: 0 days of medicine on hand       REFERRAL TO PHARMACIST     Referral to the pharmacist: Not needed      [facility]     Shipping address confirmed in Epic.     Delivery Scheduled: Yes, Expected medication delivery date: 03/01/22.  However, Rx request for refills was sent to the provider as there are none remaining.     Medication will be delivered via Same Day Courier to the prescription address in Epic [place].    Wyatt Mage M Elisabeth Cara   [facility] Specialty Technician

## 2022-02-18 NOTE — Unmapped (Signed)
The [facility] has made a second and final attempt to reach this patient to refill the following medication:Cosentyx & Rasuvo.      We have left voicemails on the following phone numbers: [phone] and have sent a MyChart message.Well[employer]    Dates contacted: 2/21,3/2  Last scheduled delivery: 12/23 [place]orn) 11/29 (Cosentyx)    The patient may be at risk of non-compliance with this medication. The patient should call the [facility] at [phone]  Option 4, then Option 2 (all other specialty patients) to refill medication.    Olga Millers   [facility] Specialty Technician

## 2022-02-24 ENCOUNTER — Ambulatory Visit: Admit: 2022-02-24 | Discharge: 2022-02-25 | Payer: MEDICARE

## 2022-02-24 DIAGNOSIS — Z79631 Methotrexate, long term, current use: Principal | ICD-10-CM

## 2022-02-24 DIAGNOSIS — L4050 Arthropathic psoriasis, unspecified: Principal | ICD-10-CM

## 2022-02-24 DIAGNOSIS — L409 Psoriasis, unspecified: Principal | ICD-10-CM

## 2022-02-24 LAB — ALBUMIN: ALBUMIN: 3.9 g/dL (ref 3.4–5.0)

## 2022-02-24 LAB — CREATININE
CREATININE: 0.81 mg/dL — ABNORMAL HIGH
EGFR CKD-EPI (2021) FEMALE: 86 mL/min/{1.73_m2} (ref >=60)

## 2022-02-24 LAB — ALT: ALT (SGPT): <7 U/L — ABNORMAL LOW (ref 10–49)

## 2022-02-24 LAB — AST: AST (SGOT): 17 U/L (ref <=34)
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
## 2022-03-01 MED FILL — KETOCONAZOLE 2 % SHAMPOO: 30 days supply | Qty: 120 | Fill #1

## 2022-03-01 MED FILL — COSENTYX PEN 300 MG/2 PENS (150 MG/ML) SUBCUTANEOUS: SUBCUTANEOUS | 84 days supply | Qty: 6 | Fill #1

## 2022-03-23 NOTE — Unmapped (Signed)
Patient has enough medication on hand, rescheduling refill call for 5/29

## 2022-03-24 ENCOUNTER — Ambulatory Visit: Admit: 2022-03-24 | Discharge: 2022-03-24 | Payer: MEDICARE

## 2022-03-24 DIAGNOSIS — R928 Other abnormal and inconclusive findings on diagnostic imaging of breast: Principal | ICD-10-CM

## 2022-03-25 DIAGNOSIS — Z1231 Encounter for screening mammogram for malignant neoplasm of breast: Principal | ICD-10-CM

## 2022-03-29 ENCOUNTER — Ambulatory Visit: Admit: 2022-03-29 | Discharge: 2022-03-30 | Payer: MEDICARE

## 2022-03-29 DIAGNOSIS — R928 Other abnormal and inconclusive findings on diagnostic imaging of breast: Principal | ICD-10-CM
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
## 2022-04-09 ENCOUNTER — Ambulatory Visit: Admit: 2022-04-09 | Discharge: 2022-04-10 | Payer: MEDICARE | Attending: Family | Primary: Family

## 2022-04-09 DIAGNOSIS — R799 Abnormal finding of blood chemistry, unspecified: Principal | ICD-10-CM

## 2022-04-09 DIAGNOSIS — M79603 Pain in arm, unspecified: Principal | ICD-10-CM

## 2022-04-09 DIAGNOSIS — R209 Unspecified disturbances of skin sensation: Principal | ICD-10-CM

## 2022-04-09 DIAGNOSIS — E611 Iron deficiency: Principal | ICD-10-CM
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
## 2022-05-10 ENCOUNTER — Ambulatory Visit
Admit: 2022-05-10 | Discharge: 2022-05-11 | Payer: MEDICARE | Attending: Nurse Practitioner | Primary: Nurse Practitioner

## 2022-05-10 DIAGNOSIS — M79603 Pain in arm, unspecified: Principal | ICD-10-CM

## 2022-05-10 DIAGNOSIS — R209 Unspecified disturbances of skin sensation: Principal | ICD-10-CM

## 2022-05-21 NOTE — Unmapped (Signed)
[name] Refill Coordination Note    Specialty Medication(s) to be Shipped:   Inflammatory Disorders: Cosentyx and Rasuvo    Other medication(s) to be shipped: Escitalopram, Clobetasol ext sol, Ketoconazole and Leucovorin     Mary Waters, DOB: [birth year]  Phone: [phone] (home)       All above HIPAA information was verified with patient.     Was a Nurse, learning disability used for this call? No    Completed refill call assessment today to schedule patient's medication shipment from the [facility] [phone]).  All relevant notes have been reviewed.     Specialty medication(s) and dose(s) confirmed: Regimen is correct and unchanged.   Changes to medications: Mary Waters reports no changes at this time.  Changes to insurance: No  New side effects reported not previously addressed with a pharmacist or physician: None reported  Questions for the pharmacist: No    Confirmed patient received a Conservation officer, historic buildings and a Surveyor, mining with first shipment. The patient will receive a drug information handout for each medication shipped and additional FDA Medication Guides as required.       DISEASE/MEDICATION-SPECIFIC INFORMATION        For patients on injectable medications: Patient currently has Cosentyx-0 Rasuvo-0 doses left.  Next injection is scheduled for Cosentyx-06/01/22 Rasuvo-05/27/22.    SPECIALTY MEDICATION ADHERENCE     Medication Adherence    Patient reported X missed doses in the last month: 0  Specialty Medication: cosentyx  Patient is on additional specialty medications: Yes  Additional Specialty Medications: Rasuvo  Patient Reported Additional Medication X Missed Doses in the Last Month: 0  Patient is on more than two specialty medications: No  Any gaps in refill history greater than 2 weeks in the last 3 months: no  Demonstrates understanding of importance of adherence: yes  Informant: patient  Reliability of informant: reliable  Confirmed plan for next specialty medication refill: delivery by pharmacy  Refills needed for supportive medications: not needed              Were doses missed due to medication being on hold? No    Rasuvo 20mg /0.6mL: 0 days of medicine on hand   Cosentyx 150mg /mL: 0 days of medicine on hand       REFERRAL TO PHARMACIST     Referral to the pharmacist: Not needed      [facility]     Shipping address confirmed in Epic.     Delivery Scheduled: Yes, Expected medication delivery date: 05/24/2022.     Medication will be delivered via Same Day Courier to the prescription address in Epic WAM.    Mary Waters   [facility] Specialty Technician

## 2022-05-24 MED FILL — LEUCOVORIN CALCIUM 5 MG TABLET: ORAL | 84 days supply | Qty: 48 | Fill #1

## 2022-05-24 MED FILL — ESCITALOPRAM 10 MG TABLET: ORAL | 90 days supply | Qty: 90 | Fill #1

## 2022-05-24 MED FILL — CLOBETASOL 0.05 % SCALP SOLUTION: TOPICAL | 25 days supply | Qty: 50 | Fill #1

## 2022-05-24 MED FILL — RASUVO (PF) 20 MG/0.4 ML SUBCUTANEOUS AUTO-INJECTOR: SUBCUTANEOUS | 84 days supply | Qty: 4.8 | Fill #1

## 2022-05-24 MED FILL — KETOCONAZOLE 2 % SHAMPOO: 30 days supply | Qty: 120 | Fill #2

## 2022-05-24 MED FILL — COSENTYX PEN 300 MG/2 PENS (150 MG/ML) SUBCUTANEOUS: SUBCUTANEOUS | 28 days supply | Qty: 2 | Fill #2

## 2022-06-23 ENCOUNTER — Ambulatory Visit: Admit: 2022-06-23 | Discharge: 2022-06-23 | Payer: MEDICARE

## 2022-06-23 ENCOUNTER — Ambulatory Visit
Admit: 2022-06-23 | Discharge: 2022-06-23 | Payer: MEDICARE | Attending: Nurse Practitioner | Primary: Nurse Practitioner

## 2022-06-23 DIAGNOSIS — M79603 Pain in arm, unspecified: Principal | ICD-10-CM

## 2022-06-23 DIAGNOSIS — R209 Unspecified disturbances of skin sensation: Principal | ICD-10-CM
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
## 2022-07-15 ENCOUNTER — Ambulatory Visit: Admit: 2022-07-15 | Discharge: 2022-07-16 | Payer: MEDICARE

## 2022-07-15 DIAGNOSIS — M7061 Trochanteric bursitis, right hip: Principal | ICD-10-CM

## 2022-07-15 DIAGNOSIS — R7309 Other abnormal glucose: Principal | ICD-10-CM

## 2022-07-15 DIAGNOSIS — E559 Vitamin D deficiency, unspecified: Principal | ICD-10-CM

## 2022-07-15 DIAGNOSIS — R718 Other abnormality of red blood cells: Principal | ICD-10-CM

## 2022-07-15 DIAGNOSIS — M7062 Trochanteric bursitis, left hip: Principal | ICD-10-CM

## 2022-07-15 DIAGNOSIS — L4050 Arthropathic psoriasis, unspecified: Principal | ICD-10-CM

## 2022-07-15 DIAGNOSIS — Z79631 Methotrexate, long term, current use: Principal | ICD-10-CM

## 2022-07-15 DIAGNOSIS — R700 Elevated erythrocyte sedimentation rate: Principal | ICD-10-CM

## 2022-07-15 LAB — CBC W/ AUTO DIFF
BASOPHILS ABSOLUTE COUNT: 0.1 10*9/L (ref 0.0–0.1)
BASOPHILS RELATIVE PERCENT: 1.5 %
EOSINOPHILS ABSOLUTE COUNT: 0.1 10*9/L (ref 0.0–0.5)
EOSINOPHILS RELATIVE PERCENT: 2.1 %
HEMATOCRIT: 36.4 % (ref 34.0–44.0)
HEMOGLOBIN: 11.8 g/dL (ref 11.3–14.9)
LYMPHOCYTES ABSOLUTE COUNT: 2 10*9/L (ref 1.1–3.6)
LYMPHOCYTES RELATIVE PERCENT: 44 %
MEAN CORPUSCULAR HEMOGLOBIN CONC: 32.4 g/dL (ref 32.0–36.0)
MEAN CORPUSCULAR HEMOGLOBIN: 29 pg (ref 25.9–32.4)
MEAN CORPUSCULAR VOLUME: 89.3 fL (ref 77.6–95.7)
MEAN PLATELET VOLUME: 6.9 fL (ref 6.8–10.7)
MONOCYTES ABSOLUTE COUNT: 0.4 10*9/L (ref 0.3–0.8)
MONOCYTES RELATIVE PERCENT: 9.2 %
NEUTROPHILS ABSOLUTE COUNT: 2 10*9/L (ref 1.8–7.8)
NEUTROPHILS RELATIVE PERCENT: 43.2 %
NUCLEATED RED BLOOD CELLS: 0 /100{WBCs} (ref <=4)
PLATELET COUNT: 291 10*9/L (ref 150–450)
RED BLOOD CELL COUNT: 4.08 10*12/L (ref 3.95–5.13)
RED CELL DISTRIBUTION WIDTH: 13.3 % (ref 12.2–15.2)
WBC ADJUSTED: 4.7 10*9/L (ref 3.6–11.2)

## 2022-07-15 LAB — COMPREHENSIVE METABOLIC PANEL
ALBUMIN: 3.7 g/dL (ref 3.4–5.0)
ALKALINE PHOSPHATASE: 97 U/L (ref 46–116)
ALT (SGPT): 9 U/L — ABNORMAL LOW (ref 10–49)
ANION GAP: 8 mmol/L (ref 5–14)
AST (SGOT): 18 U/L (ref <=34)
BILIRUBIN TOTAL: 0.5 mg/dL (ref 0.3–1.2)
BLOOD UREA NITROGEN: 8 mg/dL — ABNORMAL LOW (ref 9–23)
BUN / CREAT RATIO: 11
CALCIUM: 9.3 mg/dL (ref 8.7–10.4)
CHLORIDE: 104 mmol/L (ref 98–107)
CO2: 27.9 mmol/L (ref 20.0–31.0)
CREATININE: 0.74 mg/dL
EGFR CKD-EPI (2021) FEMALE: >90 mL/min/{1.73_m2} (ref >=60)
GLUCOSE RANDOM: 92 mg/dL (ref 70–179)
POTASSIUM: 4.2 mmol/L (ref 3.4–4.8)
PROTEIN TOTAL: 7.6 g/dL (ref 5.7–8.2)
SODIUM: 140 mmol/L (ref 135–145)

## 2022-07-15 LAB — FERRITIN: FERRITIN: 58.5 ng/mL

## 2022-07-15 LAB — SEDIMENTATION RATE: ERYTHROCYTE SEDIMENTATION RATE: 108 mm/h — ABNORMAL HIGH (ref 0–30)

## 2022-07-15 LAB — FOLATE: FOLATE: 14.4 ng/mL (ref >=5.4)

## 2022-07-15 LAB — TRANSFERRIN: TRANSFERRIN: 241 mg/dL — ABNORMAL LOW

## 2022-07-15 LAB — HEMOGLOBIN A1C
ESTIMATED AVERAGE GLUCOSE: 128 mg/dL
HEMOGLOBIN A1C: 6.1 % — ABNORMAL HIGH (ref 4.8–5.6)

## 2022-07-15 LAB — IRON & TIBC
IRON SATURATION: 17 % — ABNORMAL LOW (ref 20–55)
IRON: 52 ug/dL
TOTAL IRON BINDING CAPACITY: 302 ug/dL (ref 250–425)

## 2022-07-15 LAB — VITAMIN B12: VITAMIN B-12: 609 pg/mL (ref 211–911)

## 2022-07-15 LAB — C-REACTIVE PROTEIN: C-REACTIVE PROTEIN: 30 mg/L — ABNORMAL HIGH (ref <=10.0)

## 2022-07-15 NOTE — Unmapped (Addendum)
Psoriatic arthritis under good control. No changes in medication regimen.    For hip pain related to trochanteric bursitis, try attached exercises as well as applying over the counter topical diclofenac (Voltaren) gel for pain relief.     Hand pain is most likely related to osteoarthritis. The diclofenac (Voltaren) gel can also help relieve this pain.     Cervical myelopathy can also be aggravating your hand pain and sensation of cold.

## 2022-07-15 NOTE — Unmapped (Signed)
Patient Name: Mary Waters  PCP: ??Noralyn Pick, FNP  Source of History: patient and records  Date of Visit: 07/15/22 9:01 AM    Chief Compliant: follow-up for PsA    PRIOR RHEUMATOLOGIC HISTORY:?? Psoriatis arthritis.   Established care in September 2016.  Ultrasound in 2016 showing small PIP joint effusions, presence of enthesophytes, and achilles tendon inflammation/tendinitis consistent with an underlying spondyloarthropathy such as psoriatic arthritis.    Humira 2016-06/2016 with loss of efficacy  Methotrexate added 01/2016 for persistent peripheral arthritis; switched to auto-injector 07/2019, dose increased February 2022 to 20 mg once a week.  Enbrel 06/2016-09/2017 with loss of efficacy  Cimzia 09/2017 to 07/2019.   Cosentyx  07/2019 -     Never pursued surgical decompression for cervical myelopathy. Has numbness and paresthesias in both hands.     Current med regimen: Cosentyx 300 mg q 28 days, Rasuvo 20 mg qwk, leucovorin 20 mg q wk.     HPI: Mary Waters is a 56 y.o. female who presents for her follow-up for psoriatic arthritis with known cervical myelopathy. Last evaluated by me in November 2022 and more recently by Carlus Pavlov [facility] in March 2023. Foot x-ray in March 2023 unchanged from prior with no new erosive changes and stable enthesopathic changes. Clinical exam stable at both visits so no medication changes made. She presents to clinic in person today.    Today, patient reports some aches and pain in her fingers and toes as well as shoulders and left elbow. She has felt achy all over in general. She denies any joint swelling. She notes morning stiffness as well as gelling phenomena after long periods of sitting. She reports morning stiffness takes 15-20 minutes where as with sitting resolves within 10 minutes of standing and walking. She continues to have numbness and paresthesias in both hands. She does note worsening hand coordination. She continues to decline further evaluation of cervical myelopathy decompression. She has been having difficulty getting her Cosentyx. Now only receiving one month supply at a time and has not received phone call for this month. Message sent to pharmacy team. She has been getting her Rasuvo and leucovorin. She reports developing plantar fasciitis in the right foot recently. Plantar fasciitis has improved with rest.     ROS??: Attests to the above, otherwise, review of all other systems is negative.   ????  Past Medical and Surgical History:  ??  Patient Active Problem List    Diagnosis Date Noted   ??? Painful and cold upper extremity 04/09/2022   ??? Painful and cold lower extremity 04/09/2022   ??? Abnormal finding of blood chemistry, unspecified 04/09/2022   ??? Low iron 04/09/2022   ??? Seborrheic dermatitis 12/07/2021   ??? Furunculosis 12/07/2021   ??? Hidradenitis suppurativa 12/07/2021   ??? Rash 12/07/2021   ??? Keratosis pilaris 12/07/2021   ??? Immunocompromised (CMS-HCC) 07/22/2021   ??? Psoriatic arthritis (CMS-HCC) 01/07/2016   ??? Menopause 08/07/2015   ??? History of tobacco use 10/23/2014   ??? Fibromyalgia 04/01/2014   ??? Carpal tunnel syndrome of right wrist 04/01/2014   ??? GERD (gastroesophageal reflux disease) 04/01/2014   ??? Chronic pain syndrome 04/01/2014   ??? Morbid obesity due to excess calories (CMS-HCC) 04/01/2014   ??? Psoriasis 04/01/2014   ??? Allergic rhinitis 02/04/2014   ??? Cataracts, both eyes 10/03/2013   ??? Health care maintenance 12/23/2010   ??? IBS (irritable bowel syndrome) 12/20/2008     Past Surgical History:   Procedure Laterality  Date   ??? CESAREAN SECTION     ??? DILATION AND CURETTAGE OF UTERUS     ??? HYSTERECTOMY  2001    TAH   ??? KNEE SURGERY     ??? TOTAL ABDOMINAL HYSTERECTOMY       Allergies:   ??  Allergies   Allergen Reactions   ??? Prednisone Hives   ??? Sulfa (Sulfonamide Antibiotics) Hives     Other reaction(s): HIVES  Other reaction(s): HIVES   ??? Sulfasalazine      Other reaction(s): HIVES   ??? Enbrel [Etanercept] Rash     Current Outpatient Medications:  ??  Current Outpatient Medications on File Prior to Visit   Medication Sig Dispense Refill   ??? acetaminophen (TYLENOL) 500 MG tablet Take 2 tablets (1,000 mg total) by mouth every four (4) hours as needed.     ??? clindamycin (CLEOCIN T) 1 % lotion Apply topically to affected bumps two (2) times a  until resolved. 60 mL 5   ??? clobetasoL (TEMOVATE) 0.05 % external solution Apply topically Two (2) times a day. Use for irritation/itch on scalp until it resolves, then discontinue. 50 mL 3   ??? escitalopram oxalate (LEXAPRO) 10 MG tablet Take 1 tablet (10 mg total) by mouth daily. 90 tablet 3   ??? ketoconazole (NIZORAL) 2 % shampoo Apply to scalp for 5-10 minutes, then wash off. Can use 1-2 times a week. 120 mL 10   ??? leuCOVorin (WELLCOVORIN) 5 mg tablet Take 4 tablets (20 mg total) by mouth every seven (7) days. Take 12-24 hours after methotrexate. 48 tablet 3   ??? menthol/camphor (CAMPHOR-MENTHOL) 4-10 % Crea Apply topically.     ??? methotrexate, PF, (RASUVO, PF,) 20 mg/0.4 mL AtIn Inject the contents of 1 pen (20 mg) under the skin every seven (7) days. 4.8 mL 3   ??? secukinumab (COSENTYX PEN, 2 PENS,) 150 mg/mL PnIj injection Inject the contents of 2 pens (300 mg) under the skin every twenty-eight (28) days. 6 mL 3     No current facility-administered medications on file prior to visit.   ??  ??  Immunization History   Administered Date(s) Administered   ??? COVID-19 VAC,BIVALENT(29YR UP),PFIZER 02/24/2022   ??? COVID-19 VACC,MRNA,(PFIZER)(PF) 03/02/2020, 03/22/2020, 09/16/2020   ??? COVID-19 VACCINE,MRNA(MODERNA)(PF) 07/22/2021   ??? Influenza Vaccine Quad (IIV4 PF) 34mo+ injectable 09/26/2015, 10/14/2016, 12/02/2017, 11/07/2018, 10/03/2019, 10/08/2020, 10/21/2021   ??? PNEUMOCOCCAL POLYSACCHARIDE 23 03/04/2016   ??? PPD Test 09/12/2013   ??? Pneumococcal Conjugate 13-Valent 10/23/2015   ??? Rubella 01/10/1979   ??? TdaP 09/12/2013     ??  PHYSICAL EXAM?  Vital signs: BP 129/83  - Pulse 88  - Temp 37.3 ??C (99.1 ??F)  - Ht 172.7 cm (5' 8)  - Wt (!) 108.4 kg (239 lb)  - BMI 36.34 kg/m?? Body mass index is 36.34 kg/m??.  Gen: Well-developed, well-nourished adult in no apparent distress. Normocephalic with no external signs of trauma. Pleasant and cooperative. AOx4.?  HEENT: PERRLA, EOMI, mask in place  Lungs: Broad chest excursion with good air movement. CTAB without wheezing/rhonchi/rales. ??  CV: RRR, Normal S1/S2, No murmurs/rubs/gallops heard.  PV: Warm, 2+ radial and pedal pulses, no C/C/E.??  Neuro: Good comprehension/cognition. CN 2-12 intact.  Muscle strength 5/5 in all extremities. Gait normal.??  Comprehensive Musculoskeletal Examination:??  ?? Jaw, neck without limited ROM.??  ?? Shoulders, elbows, wrists, hands, fingers: No MCP, PIP, or DIP swelling today. Reports mild tenderness with palpation of bilateral 1st and 3rd digits. Unable  to make tight fist bilaterally but grip strength intact. Wrists, elbows and shoulders wnl  ?? Occiput to wall 0 cm, chest wall expansion 5 cm, Modified Schober 15 cm to 18 cm. All stable  ?? Bilateral hips with good ROM. Tender over bilateral trochanter  ?? Knee, ankles, feet, toes: No deformity, erythema, warmth, swelling, effusion, tenderness, limited ROM. Knee stable to valgus/varus stress and anterior/posterior drawer sign.?? Unable to elicit right plantar fasciitis tenderness.   Skin: No psoriasis noted    LABORATORY-persistently elevated inflammatory markers. HgbA1c borderline. Awaiting SPEP. Mild iron deficiency anemia.  Recent Results (from the past 168 hour(s))   CRP  C-Reactive Protein    Collection Time: 07/15/22  9:43 AM   Result Value Ref Range    CRP 30.0 (H) <=10.0 mg/L   ESR Sed rate    Collection Time: 07/15/22  9:43 AM   Result Value Ref Range    Sed Rate 108 (H) 0 - 30 mm/h   Comprehensive Metabolic Panel    Collection Time: 07/15/22  9:43 AM   Result Value Ref Range    Sodium 140 135 - 145 mmol/L    Potassium 4.2 3.4 - 4.8 mmol/L    Chloride 104 98 - 107 mmol/L    CO2 27.9 20.0 - 31.0 mmol/L    Anion Gap 8 5 - 14 mmol/L    BUN 8 (L) 9 - 23 mg/dL    Creatinine 0.10 9.32 - 0.80 mg/dL    BUN/Creatinine Ratio 11     eGFR CKD-EPI (2021) Female >90 >=60 mL/min/1.92m2    Glucose 92 70 - 179 mg/dL    Calcium 9.3 8.7 - 35.5 mg/dL    Albumin 3.7 3.4 - 5.0 g/dL    Total Protein 7.6 5.7 - 8.2 g/dL    Total Bilirubin 0.5 0.3 - 1.2 mg/dL    AST 18 <=73 U/L    ALT 9 (L) 10 - 49 U/L    Alkaline Phosphatase 97 46 - 116 U/L   Ferritin    Collection Time: 07/15/22  9:43 AM   Result Value Ref Range    Ferritin 58.5 7.3 - 270.7 ng/mL   Iron & TIBC    Collection Time: 07/15/22  9:43 AM   Result Value Ref Range    Iron 52 50 - 170 ug/dL    TIBC 220 254 - 270 ug/dL    Iron Saturation (%) 17 (L) 20 - 55 %   Transferrin    Collection Time: 07/15/22  9:43 AM   Result Value Ref Range    Transferrin 241.0 (L) 250.0 - 380.0 mg/dL   Vitamin W23 Level    Collection Time: 07/15/22  9:43 AM   Result Value Ref Range    Vitamin B-12 609 211 - 911 pg/ml   Folate Level    Collection Time: 07/15/22  9:43 AM   Result Value Ref Range    Folate 14.4 >=5.4 ng/mL   Hemoglobin A1c    Collection Time: 07/15/22  9:43 AM   Result Value Ref Range    Hemoglobin A1C 6.1 (H) 4.8 - 5.6 %    Estimated Average Glucose 128 mg/dL   CBC w/ Differential    Collection Time: 07/15/22  9:43 AM   Result Value Ref Range    WBC 4.7 3.6 - 11.2 10*9/L    RBC 4.08 3.95 - 5.13 10*12/L    HGB 11.8 11.3 - 14.9 g/dL    HCT 76.2 83.1 - 51.7 %  MCV 89.3 77.6 - 95.7 fL    MCH 29.0 25.9 - 32.4 pg    MCHC 32.4 32.0 - 36.0 g/dL    RDW 08.6 57.8 - 46.9 %    MPV 6.9 6.8 - 10.7 fL    Platelet 291 150 - 450 10*9/L    nRBC 0 <=4 /100 WBCs    Neutrophils % 43.2 %    Lymphocytes % 44.0 %    Monocytes % 9.2 %    Eosinophils % 2.1 %    Basophils % 1.5 %    Absolute Neutrophils 2.0 1.8 - 7.8 10*9/L    Absolute Lymphocytes 2.0 1.1 - 3.6 10*9/L    Absolute Monocytes 0.4 0.3 - 0.8 10*9/L    Absolute Eosinophils 0.1 0.0 - 0.5 10*9/L    Absolute Basophils 0.1 0.0 - 0.1 10*9/L ??IMAGING?? - no recent    ??GENERAL SUMMARY AND IMPRESSION: ??  ??  In summary, the patient is a 56 y.o. female here for follow-up for psoriatic arthritis on secukinumab 300 mg monthly injections with methotrexate 20 mg subcutaneous weekly injections followed by leucovorin 20 mg once a week. PsA stable although still unable to make tight fist bilaterally. Axial exam stable. Continue current medication regimen. Lab monitoring including inflammatory marker persistently elevated. Mild trochanteric bursitis on exam and recommended conservative management. Follow-up in 5 months with Carl[facility]lov [facility] and 10 months with myself or sooner pending lab studies. Return in about 10 months (around 05/16/2023).      RECOMMENDATIONS: ??  ????   Diagnosis ICD-10-CM Associated Orders   1. Psoriatic arthritis (CMS-HCC)  L40.50 CBC w/ Differential     CRP  C-Reactive Protein     ESR Sed rate     Comprehensive Metabolic Panel     25 OH Vit D      2. Methotrexate, long term, current use  Z79.631 CBC w/ Differential     CRP  C-Reactive Protein     ESR Sed rate     Comprehensive Metabolic Panel     25 OH Vit D      3. Elevated sed rate  R70.0 Serum Protein Electrophoresis and Immunofixation     Serum Free Light Chains     CBC w/ Differential     CRP  C-Reactive Protein     ESR Sed rate     Comprehensive Metabolic Panel     Ferritin     Iron & TIBC     Transferrin     Vitamin B12 Level     Folate Level     Hemoglobin A1c     Total Protein for SPE      4. Vitamin D deficiency  E55.9 25 OH Vit D      5. Other abnormality of red blood cells  R71.8 Ferritin     Iron & TIBC     Transferrin      6. Other abnormal glucose  R73.09 Hemoglobin A1c      7. Trochanteric bursitis of both hips  M70.61     M70.62           Patient Instructions   Psoriatic arthritis under good control. No changes in medication regimen.    For hip pain related to trochanteric bursitis, try attached exercises as well as applying over the counter topical diclofenac (Voltaren) gel for pain relief.     Hand pain is most likely related to osteoarthritis. The diclofenac (Voltaren) gel can also help relieve this pain.  Cervical myelopathy can also be aggravating your hand pain and sensation of cold.       The patient indicates understanding of these issues and agrees to the plan as outlined above.  Contact information provided for any concerns or questions in the interim.  ?  I personally spent 29 minutes face-to-face and non-face-to-face in the care of this patient, which includes all pre, intra, and post visit time on the date of service.  All documented time was specific to the E/M visit and does not include any procedures that may have been performed.    Karysa Heft C. Scarlette Calico, MD, PhD  Assistant Professor of Medicine  Department of Medicine/Division of Rheumatology  [facility]  9:05 PM

## 2022-07-15 NOTE — Unmapped (Signed)
Spoke with Mary Waters and she states she has one dose of Cosentyx left that she will take this Saturday (7/29). We agreed to follow-up in 2 weeks to schedule Cosentyx to deliver with her other medications to keep meds in sync.

## 2022-07-16 LAB — SERUM PROTEIN ELECTROPHORESIS AND IMMUNOFIXATION
ALBUMIN (SPE): 3.8 g/dL (ref 3.5–5.0)
ALPHA-1 GLOBULIN: 0.4 g/dL (ref 0.2–0.5)
ALPHA-2 GLOBULIN: 1 g/dL (ref 0.5–1.1)
BETA-1 GLOBULIN: 0.5 g/dL (ref 0.3–0.6)
BETA-2 GLOBULIN: 0.5 g/dL (ref 0.2–0.6)
GAMMAGLOBULIN: 1.2 g/dL (ref 0.5–1.5)
PROTEIN TOTAL (SPECIAL CHEM): 7.3 g/dL

## 2022-07-16 LAB — SERUM FREE LIGHT CHAINS
K/L FLC RATIO: 1.75 — ABNORMAL HIGH (ref 0.26–1.65)
KAPPA FREE,SERUM: 2.89 mg/dL — ABNORMAL HIGH (ref 0.33–1.94)
LAMBDA FREE, SER: 1.65 mg/dL (ref 0.57–2.63)

## 2022-07-21 LAB — VITAMIN D 25 HYDROXY: VITAMIN D, TOTAL (25OH): 20.6 ng/mL (ref 20.0–80.0)

## 2022-08-02 NOTE — Unmapped (Signed)
[facility] Refill Coordination Note    Specialty Medication(s) to be Shipped:   Inflammatory Disorders: Cosentyx and Rasuvo    Other medication(s) to be shipped: leucovorin, ketoconazole     Mary Waters, DOB: 05-10-1966  Phone: [phone] (home)       All above HIPAA information was verified with patient.     Was a Nurse, learning disability used for this call? No    Completed refill call assessment today to schedule patient's medication shipment from the [facility] [phone]).  All relevant notes have been reviewed.     Specialty medication(s) and dose(s) confirmed: Regimen is correct and unchanged.   Changes to medications: Mary Waters reports no changes at this time.  Changes to insurance: No  New side effects reported not previously addressed with a pharmacist or physician: None reported  Questions for the pharmacist: No    Confirmed patient received a Conservation officer, historic buildings and a Surveyor, mining with first shipment. The patient will receive a drug information handout for each medication shipped and additional FDA Medication Guides as required.       DISEASE/MEDICATION-SPECIFIC INFORMATION        For patients on injectable medications: Patient currently has 0 doses left.  Next injection is scheduled for 8/26.    SPECIALTY MEDICATION ADHERENCE     Medication Adherence    Patient reported X missed doses in the last month: 0  Specialty Medication: Rasuvo  Patient is on additional specialty medications: Yes  Additional Specialty Medications: cosentyx  Patient Reported Additional Medication X Missed Doses in the Last Month: 0  Patient is on more than two specialty medications: No  Any gaps in refill history greater than 2 weeks in the last 3 months: no  Demonstrates understanding of importance of adherence: yes  Informant: patient                          Were doses missed due to medication being on hold? No    Cosentyx 150mg /ml: Patient has 0 days of medication on hand  Rasuvo 20mg /0.33ml: Patient has 0 days of medication on hand    REFERRAL TO PHARMACIST     Referral to the pharmacist: Not needed      [facility]     Shipping address confirmed in Epic.     Delivery Scheduled: Yes, Expected medication delivery date: 8/23.     Medication will be delivered via Same Day Courier to the prescription address in Epic WAM.    Olga Millers   [facility] Specialty Technician

## 2022-08-11 MED FILL — LEUCOVORIN CALCIUM 5 MG TABLET: ORAL | 84 days supply | Qty: 48 | Fill #2

## 2022-08-11 MED FILL — COSENTYX PEN 300 MG/2 PENS (150 MG/ML) SUBCUTANEOUS: SUBCUTANEOUS | 28 days supply | Qty: 2 | Fill #3

## 2022-08-11 MED FILL — KETOCONAZOLE 2 % SHAMPOO: 30 days supply | Qty: 120 | Fill #3

## 2022-08-11 MED FILL — RASUVO (PF) 20 MG/0.4 ML SUBCUTANEOUS AUTO-INJECTOR: SUBCUTANEOUS | 84 days supply | Qty: 4.8 | Fill #2

## 2022-09-03 NOTE — Unmapped (Signed)
[facility] Refill Coordination Note    Specialty Medication(s) to be Shipped:   Inflammatory Disorders: Cosentyx    Other medication(s) to be shipped:  ketoconazole     Mary Waters, DOB: 1966-02-15  Phone: [phone] (home)       All above HIPAA information was verified with patient.     Was a Nurse, learning disability used for this call? No    Completed refill call assessment today to schedule patient's medication shipment from the [facility] [phone]).  All relevant notes have been reviewed.     Specialty medication(s) and dose(s) confirmed: Regimen is correct and unchanged.   Changes to medications: [name] reports no changes at this time.  Changes to insurance: No  New side effects reported not previously addressed with a pharmacist or physician: None reported  Questions for the pharmacist: No    Confirmed patient received a Conservation officer, historic buildings and a Surveyor, mining with first shipment. The patient will receive a drug information handout for each medication shipped and additional FDA Medication Guides as required.       DISEASE/MEDICATION-SPECIFIC INFORMATION        For patients on injectable medications: Patient currently has 1 doses left.  Next injection is scheduled for 09/14/22.    SPECIALTY MEDICATION ADHERENCE     Medication Adherence    Patient reported X missed doses in the last month: 0  Specialty Medication: COSENTYX PEN (2 PENS) 150 mg/mL  Patient is on additional specialty medications: No                                Were doses missed due to medication being on hold? No    Cosentyx 150 mg/ml: 14 days of medicine on hand        REFERRAL TO PHARMACIST     Referral to the pharmacist: Not needed      [facility]     Shipping address confirmed in Epic.     Delivery Scheduled: Yes, Expected medication delivery date: 09/10/22.     Medication will be delivered via Same Day Courier to the prescription address in Epic WAM.    Unk Lightning   [facility] Specialty Technician

## 2022-09-10 MED FILL — KETOCONAZOLE 2 % SHAMPOO: 30 days supply | Qty: 120 | Fill #4

## 2022-09-10 MED FILL — COSENTYX PEN 300 MG/2 PENS (150 MG/ML) SUBCUTANEOUS: SUBCUTANEOUS | 56 days supply | Qty: 4 | Fill #4

## 2022-09-29 ENCOUNTER — Ambulatory Visit: Admit: 2022-09-29 | Discharge: 2022-09-29 | Payer: MEDICARE

## 2022-09-29 DIAGNOSIS — R928 Other abnormal and inconclusive findings on diagnostic imaging of breast: Principal | ICD-10-CM

## 2022-11-19 DIAGNOSIS — L4050 Arthropathic psoriasis, unspecified: Principal | ICD-10-CM

## 2022-11-19 DIAGNOSIS — L409 Psoriasis, unspecified: Principal | ICD-10-CM

## 2022-11-19 MED ORDER — COSENTYX PEN 300 MG/2 PENS (150 MG/ML) SUBCUTANEOUS
SUBCUTANEOUS | 3 refills | 84 days | Status: CP
Start: 2022-11-19 — End: ?
  Filled 2022-11-29: qty 6, 84d supply, fill #0

## 2022-11-19 NOTE — Unmapped (Signed)
The [facility] has made a third and final attempt to reach this patient to refill the following medication: Cosentyx (last filled 08/2222 for 56 days) and Rasuvo (LF 08/12/23 for 84 days) .      We have left voicemails on the following phone numbers: Cosentyx (last filled 08/2222 for 56 days) and Rasuvo (LF 08/12/23 for 84 days) .   and have sent a Mychart questionnaire..    Dates contacted: 11/2, 11/14, 12/1  Last scheduled delivery: Cosentyx (last filled 08/2222 for 56 days) and Rasuvo (LF 08/12/23 for 84 days) .      The patient may be at risk of non-compliance with this medication. The patient should call the [facility] at [phone]  Option 4, then Option 2 (all other specialty patients) to refill medication.    Julianne Rice, PharmD   [facility] Specialty Pharmacist

## 2022-11-19 NOTE — Unmapped (Signed)
Cosentyx refill  Last Visit Date: 07/15/2022  Next Visit Date: 12/08/2022    Lab Results   Component Value Date    ALT 9 (L) 07/15/2022    AST 18 07/15/2022    ALBUMIN 3.8 07/15/2022    ALBUMIN 3.7 07/15/2022    CREATININE 0.74 07/15/2022     Lab Results   Component Value Date    WBC 4.7 07/15/2022    HGB 11.8 07/15/2022    HCT 36.4 07/15/2022    PLT 291 07/15/2022     Lab Results   Component Value Date    NEUTROPCT 43.2 07/15/2022    LYMPHOPCT 44.0 07/15/2022    MONOPCT 9.2 07/15/2022    EOSPCT 2.1 07/15/2022    BASOPCT 1.5 07/15/2022

## 2022-11-22 DIAGNOSIS — F419 Anxiety disorder, unspecified: Principal | ICD-10-CM

## 2022-11-22 MED ORDER — ESCITALOPRAM 10 MG TABLET
ORAL_TABLET | Freq: Every day | ORAL | 3 refills | 90 days
Start: 2022-11-22 — End: 2023-11-22

## 2022-11-23 MED ORDER — ESCITALOPRAM 10 MG TABLET
ORAL_TABLET | Freq: Every day | ORAL | 0 refills | 90 days | Status: CP
Start: 2022-11-23 — End: 2023-11-23
  Filled 2022-11-29: qty 90, 90d supply, fill #0

## 2022-11-23 NOTE — Unmapped (Signed)
Patient is requesting the following refill  Requested Prescriptions     Pending Prescriptions Disp Refills    escitalopram oxalate (LEXAPRO) 10 MG tablet 90 tablet 0     Sig: Take 1 tablet (10 mg total) by mouth daily.       Recent Visits  Date Type Provider Dept   04/09/22 Office Visit Loran Senters, FNP [facility] [address] [facility]   Showing recent visits within past 365 days with a meds authorizing provider and meeting all other requirements  Future Appointments  No visits were found meeting these conditions.  Showing future appointments within next 365 days with a meds authorizing provider and meeting all other requirements       Labs: PHQ9:   PHQ-9 PHQ-9 TOTAL SCORE   04/01/2014   4:00 PM 8      GAD7:   GAD7 Total Score GAD-7 Total Score   03/02/2018  10:00 AM 9

## 2022-11-25 NOTE — Unmapped (Signed)
[facility] Refill Coordination Note    Specialty Medication(s) to be Shipped:   Inflammatory Disorders: Cosentyx and Rasuvo    Other medication(s) to be shipped:  Ketoconazole, Lecovorin, clobetasol solution, escitalopram     Mary Waters, DOB: 1966/04/06  Phone: [phone] (home)       All above HIPAA information was verified with patient.     Was a Nurse, learning disability used for this call? No    Completed refill call assessment today to schedule patient's medication shipment from the [facility] [phone]).  All relevant notes have been reviewed.     Specialty medication(s) and dose(s) confirmed: Regimen is correct and unchanged.   Changes to medications: Asya reports no changes at this time.  Changes to insurance: No  New side effects reported not previously addressed with a pharmacist or physician: None reported  Questions for the pharmacist: No    Confirmed patient received a Conservation officer, historic buildings and a Surveyor, mining with first shipment. The patient will receive a drug information handout for each medication shipped and additional FDA Medication Guides as required.       DISEASE/MEDICATION-SPECIFIC INFORMATION        For patients on injectable medications: Patient currently has Rasuvo 1  doses left.  Next injection is scheduled for 11/26/22. Cosentyx 0 doses left. Next injection scheduled for 12/10/22 week she thinks.     SPECIALTY MEDICATION ADHERENCE              Were doses missed due to medication being on hold? No    Rasuvo 20 mg/ml: 9 days of medicine on hand   Cosentyx Pen 150 mg/ml: 14 days of medicine on hand       REFERRAL TO PHARMACIST     Referral to the pharmacist: Not needed      [facility]     Shipping address confirmed in Epic.     Delivery Scheduled: Yes, Expected medication delivery date: 11/30/22.     Medication will be delivered via UPS to the prescription address in Epic WAM.    Sherral Hammers, PharmD   [facility] Specialty Pharmacist

## 2022-11-29 MED FILL — LEUCOVORIN CALCIUM 5 MG TABLET: ORAL | 84 days supply | Qty: 48 | Fill #3

## 2022-11-29 MED FILL — RASUVO (PF) 20 MG/0.4 ML SUBCUTANEOUS AUTO-INJECTOR: SUBCUTANEOUS | 84 days supply | Qty: 4.8 | Fill #3

## 2022-11-29 MED FILL — KETOCONAZOLE 2 % SHAMPOO: 30 days supply | Qty: 120 | Fill #5

## 2022-11-29 MED FILL — CLOBETASOL 0.05 % SCALP SOLUTION: TOPICAL | 25 days supply | Qty: 50 | Fill #2

## 2022-12-08 ENCOUNTER — Ambulatory Visit: Admit: 2022-12-08 | Discharge: 2022-12-08 | Payer: MEDICARE

## 2022-12-08 DIAGNOSIS — Z79631 Methotrexate, long term, current use: Principal | ICD-10-CM

## 2022-12-08 DIAGNOSIS — M797 Fibromyalgia: Principal | ICD-10-CM

## 2022-12-08 DIAGNOSIS — L4050 Arthropathic psoriasis, unspecified: Principal | ICD-10-CM

## 2022-12-08 LAB — CBC W/ AUTO DIFF
BASOPHILS ABSOLUTE COUNT: 0 10*9/L (ref 0.0–0.1)
BASOPHILS RELATIVE PERCENT: 0.7 %
EOSINOPHILS ABSOLUTE COUNT: 0.1 10*9/L (ref 0.0–0.5)
EOSINOPHILS RELATIVE PERCENT: 1.5 %
HEMATOCRIT: 35.5 % (ref 34.0–44.0)
HEMOGLOBIN: 11.7 g/dL (ref 11.3–14.9)
LYMPHOCYTES ABSOLUTE COUNT: 2.2 10*9/L (ref 1.1–3.6)
LYMPHOCYTES RELATIVE PERCENT: 37.9 %
MEAN CORPUSCULAR HEMOGLOBIN CONC: 33.1 g/dL (ref 32.0–36.0)
MEAN CORPUSCULAR HEMOGLOBIN: 29.5 pg (ref 25.9–32.4)
MEAN CORPUSCULAR VOLUME: 89.3 fL (ref 77.6–95.7)
MEAN PLATELET VOLUME: 7.1 fL (ref 6.8–10.7)
MONOCYTES ABSOLUTE COUNT: 0.5 10*9/L (ref 0.3–0.8)
MONOCYTES RELATIVE PERCENT: 7.9 %
NEUTROPHILS ABSOLUTE COUNT: 3 10*9/L (ref 1.8–7.8)
NEUTROPHILS RELATIVE PERCENT: 52 %
NUCLEATED RED BLOOD CELLS: 0 /100{WBCs} (ref <=4)
PLATELET COUNT: 287 10*9/L (ref 150–450)
RED BLOOD CELL COUNT: 3.97 10*12/L (ref 3.95–5.13)
RED CELL DISTRIBUTION WIDTH: 13.9 % (ref 12.2–15.2)
WBC ADJUSTED: 5.7 10*9/L (ref 3.6–11.2)

## 2022-12-08 LAB — AST: AST (SGOT): 17 U/L (ref <=34)

## 2022-12-08 LAB — CREATININE
CREATININE: 0.77 mg/dL
EGFR CKD-EPI (2021) FEMALE: >90 mL/min/{1.73_m2} (ref >=60)

## 2022-12-08 LAB — C-REACTIVE PROTEIN: C-REACTIVE PROTEIN: 29 mg/L — ABNORMAL HIGH (ref <=10.0)

## 2022-12-08 LAB — ALBUMIN: ALBUMIN: 3.7 g/dL (ref 3.4–5.0)

## 2022-12-08 LAB — ALT: ALT (SGPT): 9 U/L — ABNORMAL LOW (ref 10–49)

## 2022-12-08 LAB — SEDIMENTATION RATE: ERYTHROCYTE SEDIMENTATION RATE: 121 mm/h — ABNORMAL HIGH (ref 0–30)

## 2022-12-08 MED ORDER — LEUCOVORIN CALCIUM 5 MG TABLET
ORAL_TABLET | ORAL | 3 refills | 84 days | Status: CP
Start: 2022-12-08 — End: ?

## 2022-12-08 MED ORDER — RASUVO (PF) 20 MG/0.4 ML SUBCUTANEOUS AUTO-INJECTOR
SUBCUTANEOUS | 3 refills | 84 days | Status: CP
Start: 2022-12-08 — End: ?
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
## 2022-12-31 LAB — SEDIMENTATION RATE

## 2023-01-29 ENCOUNTER — Ambulatory Visit
Admit: 2023-01-29 | Discharge: 2023-01-30 | Payer: MEDICARE | Attending: Emergency Medicine | Primary: Emergency Medicine

## 2023-01-29 DIAGNOSIS — M25531 Pain in right wrist: Principal | ICD-10-CM

## 2023-01-29 DIAGNOSIS — M25641 Stiffness of right hand, not elsewhere classified: Principal | ICD-10-CM

## 2023-01-29 MED ORDER — MELOXICAM 15 MG TABLET
ORAL_TABLET | Freq: Every day | ORAL | 0 refills | 14 days | Status: CP
Start: 2023-01-29 — End: 2023-02-12

## 2023-01-29 NOTE — Unmapped (Signed)
[facility]      [address], [place], [place]  Subjective:     Triage Notes:  Chief Complaint   Patient presents with    Wrist Pain     Started with pain in her right hand for a week and half. Larey Seat this summer. Having a hard time bending her middle and ring finger. Pain in wrist is a shooting pain.       HPI: Mary Waters is a pleasant 57 y.o. female with GERD, IBS, fibromyalgia, chronic pain syndrome, psoriatic arthritis on methotrexate and Cosentyx here for right wrist pain and fingers locking up on the right side. RHD.     Difficulty extending fingers 3 and 4, wake up and fingers are curled up and locked in place, has to unlock them over the course of the morning, stretch them out.    Pain dorsum of distal radius/ulna  Fell over the summer and caught herself on the right wrist, did not get evlauated specifically for this injury, cannot recall any other recent injuries or trauma to the affected area.    Pain exacerbated with with extenstion, limited ROM.  Has not has pain/limited ROM in this wrist before.  The pain in the wrist radiates up to the forearm, sometimes sharp, sometimes throbbing.  Certain movements will exacerbate the pain, has been trying to keep it still.  Has tried using creams she has for psoaritic arthris, helped swelling.  Has had similar symptoms with prior psoriatic arthritis flares, suspects her Cosentyx may not be working as well, saw her rheumatologist 12/08/2022, they ordered an ultrasound of her hand to evaluate her disease, has not been completed yet, seeing them again in February 20, 2023.  Difficulty performing specific activities at home due to dominant hand being affected.    Rheumatology Appointment 11/2022  Pain n the MCPs of thumbs. Fingers are aching like when she first found out she had psoriatic arthritis.  Shoulders are stiff and sore.   Her whole body feels sore and bruised.    Has been using an OTC arthritis rub on hands and knees (works better than voltaren gel)  with some help.   AM stiffness x 15 min or so.     Reports history of hives to prednisone a long time ago.  Reports she has tolerated meloxicam in the past.    I have reviewed past medical, surgical, medications, allergies, social and family histories today.      Current Outpatient Medications:     acetaminophen (TYLENOL) 500 MG tablet, Take 2 tablets (1,000 mg total) by mouth every four (4) hours as needed., Disp: , Rfl:     clindamycin (CLEOCIN T) 1 % lotion, Apply topically to affected bumps two (2) times a  until resolved., Disp: 60 mL, Rfl: 5    escitalopram oxalate (LEXAPRO) 10 MG tablet, Take 1 tablet (10 mg total) by mouth daily., Disp: 90 tablet, Rfl: 0    ketoconazole (NIZORAL) 2 % shampoo, Apply to scalp for 5-10 minutes, then wash off. Can use 1-2 times a week., Disp: 120 mL, Rfl: 10    leuCOVorin (WELLCOVORIN) 5 mg tablet, Take 4 tablets (20 mg total) by mouth every seven (7) days. Take 12-24 hours after methotrexate., Disp: 48 tablet, Rfl: 3    menthol/camphor (CAMPHOR-MENTHOL) 4-10 % Crea, Apply topically., Disp: , Rfl:     methotrexate, PF, (RASUVO, PF,) 20 mg/0.4 mL AtIn, Inject the contents of 1 pen (20 mg) under the skin every seven (  7) days., Disp: 4.8 mL, Rfl: 3    secukinumab (COSENTYX PEN, 2 PENS,) 150 mg/mL PnIj injection, Inject the contents of 2 pens (300 mg total) under the skin every twenty-eight (28) days., Disp: 6 mL, Rfl: 3    meloxicam (MOBIC) 15 MG tablet, Take 1 tablet (15 mg total) by mouth daily for 14 days., Disp: 14 tablet, Rfl: 0     ROS:     Review of Systems    Review of systems negative unless otherwise noted as per HPI.    Objective:     Vitals:    01/29/23 1214   BP: 121/84   Pulse: 93   Resp: 16   Temp: 36.7 ??C (98.1 ??F)   SpO2: 97%   Weight: (!) 112.5 kg (248 lb)   Height: 172.7 cm (5' 7.99)       Body mass index is 37.72 kg/m??.    Physical Exam     RUE: Normal radial pulse, good cap refill, decreased sensation digits 1 2 and 3 versus 4 and 5, able to actively flex and extend the wrist through limited ROM, able to passively flex and extend and radially and ulnarly deviate the wrist through full ROM, TTP volar DRUJ and distal radius, proximal second and third metacarpals  Able to fully flex and extend all fingers through full ROM, do thumbs up and okay sign with some difficulty, good grip strength      Assessment and Plan:     57 year old female presenting with 1 week of worsening wrist pain with movement, also having stiffness and locking of 2 of her fingers.  Suspect most likely flare of psoriatic arthritis as well as trigger finger.  For trigger finger symptoms, provided referral to orthopedics to evaluate and discuss potential treatment options, as she will likely be able to see orthopedics prior to her rheumatologist.  Deferred x-rays as the only potential injury/trauma she can think of occurred more than 6 months ago, doubt it would cause symptoms in the last 1.5 weeks, could be pursued at a later visit if intervention prescribed today is not effective.    Interestingly her allergy list indicates hives to prednisone, patient reports the rash appeared several days into the course of prednisone, doubt this is a true allergy, considered giving test dose of prednisone in clinic, however patient reports delayed onset of adverse reaction, will pursue alternative course of treatment.  Has tolerated meloxicam in the past, all NSAIDs do interact with methotrexate, however on relatively lower dose of methotrexate for rheumatologic condition, will do 2-week trial of meloxicam and advise follow-up with PCP or rheumatology if possible in 2 to 3 weeks to determine if intervention is effective, if it should be stopped or continued.  Advised of potential for increased levels of methotrexate.  With higher doses of methotrexate in the past patient has experienced fatigue.    Also provided patient a lace up wrist brace for support, advised her to take it off and range the joint from time to time to prevent stiffness, does not need to wear in the shower or for sleeping.    Mary Waters was seen today for wrist pain.    Diagnoses and all orders for this visit:    Right wrist pain  -     meloxicam (MOBIC) 15 MG tablet; Take 1 tablet (15 mg total) by mouth daily for 14 days.    Finger stiffness, right  -     Ambulatory referral to Orthopedic Surgery; Future  Follow-up with Specialist  and Referral Made to Specialist Orthopedics         Results    No results found for this visit on 01/29/23.      Tobacco Use: Medium Risk (01/29/2023)    Patient History     Smoking Tobacco Use: Former     Smokeless Tobacco Use: Never     Passive Exposure: Past     No SDOH tobacco intervention needed today.    Discussed my evaluation, clinical impression, and discharge plan. Also discussed anticipatory guidance and return precautions. Patient expressed understanding, has no questions or concerns at this time.       Note - This record has been created using [employer]. Chart creation errors have been sought, but may not always have been located. Such creation errors do not reflect on the standard of medical care.    Roylene Reason, PA-C  [facility]

## 2023-01-29 NOTE — Unmapped (Addendum)
It was a pleasure taking care of you today!    Your symptoms are concerning for a flare of the psoriatic arthritis and you may also be experiencing symptoms of trigger finger.  To discuss further interventions for the trigger finger, we have placed a referral to orthopedics.  We recommend contacting your rheumatology office to see if they have any earlier available appointments to follow-up the psoriatic arthritis.    To help treat your symptoms until you can follow-up with primary care or rheumatology in 2 to 3 weeks, we have placed you on a short course of oral NSAIDs called meloxicam.  This should help with inflammation and pain in your wrist.  Sometimes taking the methotrexate with this type of medicine can increase the amount of methotrexate in your body, so we want you to follow-up to determine if the meloxicam can be safely continued or if it should be stopped.

## 2023-02-16 DIAGNOSIS — M25531 Pain in right wrist: Principal | ICD-10-CM

## 2023-02-16 MED ORDER — MELOXICAM 15 MG TABLET
ORAL_TABLET | Freq: Every day | ORAL | 1 refills | 90 days | Status: CP
Start: 2023-02-16 — End: ?

## 2023-02-17 NOTE — Unmapped (Signed)
Meloxicam refilled per pt request in mychart message

## 2023-02-24 ENCOUNTER — Ambulatory Visit: Admit: 2023-02-24 | Discharge: 2023-02-25 | Payer: MEDICARE

## 2023-02-24 ENCOUNTER — Institutional Professional Consult (permissible substitution): Admit: 2023-02-24 | Discharge: 2023-02-25 | Payer: MEDICARE | Attending: Rheumatology | Primary: Rheumatology

## 2023-02-24 DIAGNOSIS — L4050 Arthropathic psoriasis, unspecified: Principal | ICD-10-CM

## 2023-02-24 DIAGNOSIS — R350 Frequency of micturition: Principal | ICD-10-CM

## 2023-02-24 LAB — URINALYSIS WITH MICROSCOPY WITH CULTURE REFLEX
BILIRUBIN UA: NEGATIVE
BLOOD UA: NEGATIVE
GLUCOSE UA: NEGATIVE
KETONES UA: NEGATIVE
LEUKOCYTE ESTERASE UA: NEGATIVE
NITRITE UA: NEGATIVE
PH UA: 6.5 (ref 5.0–9.0)
PROTEIN UA: NEGATIVE
RBC UA: 0 /HPF (ref 0–3)
SPECIFIC GRAVITY UA: 1.01 (ref 1.005–1.030)
SQUAMOUS EPITHELIAL: 15 /HPF — ABNORMAL HIGH (ref 0–5)
UROBILINOGEN UA: 0.2
WBC UA: 2 /HPF (ref 0–3)

## 2023-02-24 MED ORDER — TREMFYA 100 MG/ML SUBCUTANEOUS AUTO-INJECTOR
SUBCUTANEOUS | 0 refills | 2.00000 days | Status: CP
Start: 2023-02-24 — End: 2023-02-26
  Filled 2023-03-14: qty 1, 28d supply, fill #0

## 2023-02-24 MED ORDER — PREDNISONE 5 MG TABLET
ORAL_TABLET | 0 refills | 0 days | Status: CP
Start: 2023-02-24 — End: ?

## 2023-02-24 NOTE — Unmapped (Addendum)
Plan to start tremfya when you would be due for next cosentyx.   Take first dose of tremfya, then 4 weeks later take 2nd dose, then take every 8 weeks.   Continue rasuvo.      Let me know if you develop a rash with the prednisone.
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
## 2023-02-25 NOTE — Unmapped (Signed)
[name] is a 57 yo female with a past medical history of Psoriatic Arthritis who presents for evaluation of right wrist and hand pain.     Study Type: Complete Diagnostic scan performed according to EULAR/OMERACT guidelines (1,2).  Indication:  Right wrist and hand pain  Diagnosis code:    Diagnosis ICD-10-CM Associated Orders   1. Psoriatic arthritis (CMS-HCC)  L40.50 US Extremity Nonvasc Comp (Joint,Muscle,Tendon,Ligament) Bilat        Study Site: right  Wrist, 2nd MCP, 2nd and 3rd PIP  Equipment: Sonosite Xporte with linear transducer    Findings:  Orthogonal views of the  left    Wrist,  2nd and 3rd PIPs, 2 MCP    were obtained in gray scale and with color power Doppler and revealed   tendon enlargement and fluid within the tendon sheath of the 3rd extensor tendon. There was a positive CPD signal of the 3rd extensor tendon .  Marland Kitchen  The remainder of visualized muscle, tendon, joint, bone and soft tissue were without any abnormalities.    Impressions:  tendon enlargement and positive CPD signal consistent with tendonitis of the 3rd extensor tendon      Images are stored in the Division of Rheumatology.  CPT [phone]    References:  1. Backhaus et al. Guidelines for musculoskeletal ultrasound in rheumatology.  Ann Rheum Dis [phone].  2. Galen Manila al. Musculoskeletal ultrasound including definitions for ultrasonographic pathology.  J Rheumatol 2956;21:3086-5.

## 2023-03-02 DIAGNOSIS — L4050 Arthropathic psoriasis, unspecified: Principal | ICD-10-CM

## 2023-03-02 DIAGNOSIS — F419 Anxiety disorder, unspecified: Principal | ICD-10-CM

## 2023-03-02 MED ORDER — ESCITALOPRAM 10 MG TABLET
ORAL_TABLET | Freq: Every day | ORAL | 0 refills | 90 days
Start: 2023-03-02 — End: 2024-03-01

## 2023-03-02 NOTE — Unmapped (Signed)
[facility] Specialty Medication Onboarding    Specialty Medication: TREMFYA 100 mg/mL Atin (guselkumab)  Prior Authorization: Not Required   Financial Assistance: No - copay  <$25  Final Copay/Day Supply: $11.20 / 28 days           $11.20 / 56 days     Insurance Restrictions: None     Notes to Pharmacist:     The triage team has completed the benefits investigation and has determined that the patient is able to fill this medication at [facility]. Please contact the patient to complete the onboarding or follow up with the prescribing physician as needed.

## 2023-03-03 MED ORDER — ESCITALOPRAM 10 MG TABLET
ORAL_TABLET | Freq: Every day | ORAL | 0 refills | 90 days | Status: CP
Start: 2023-03-03 — End: 2024-03-02
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
## 2023-03-03 NOTE — Unmapped (Signed)
Patient needs appointment, LV 04/09/22

## 2023-03-03 NOTE — Unmapped (Signed)
Patient is requesting the following refill  Requested Prescriptions     Pending Prescriptions Disp Refills    escitalopram oxalate (LEXAPRO) 10 MG tablet 90 tablet 0     Sig: Take 1 tablet (10 mg total) by mouth daily.       Recent Visits  Date Type Provider Dept   04/09/22 Office Visit Loran Senters, FNP [facility] [address] [facility]   Showing recent visits within past 365 days with a meds authorizing provider and meeting all other requirements  Future Appointments  No visits were found meeting these conditions.  Showing future appointments within next 365 days with a meds authorizing provider and meeting all other requirements       Labs: GAD7:   GAD7 Total Score GAD-7 Total Score   03/02/2018  10:00 AM 9      PHQ9:   PHQ-9 PHQ-9 TOTAL SCORE   04/01/2014   4:00 PM 8

## 2023-03-03 NOTE — Unmapped (Signed)
[facility]   Patient Onboarding/Medication Counseling    Ms.Mary Waters is a 57 y.o. female with psoriatic arthritis who I am counseling today on initiation of therapy.  I am speaking to the patient.    Was a Nurse, learning disability used for this call? No    Verified patient's date of birth / HIPAA.    Specialty medication(s) to be sent: Inflammatory Disorders: Tremfya, Rasuvo      Non-specialty medications/supplies to be sent: meloxicam       Medications not needed at this time: n/a         Tremfya (guselkumab)    Medication & Administration     Dosage:  Psoriatic arthritis: Inject the contents of 1 pen (100mg ) under the skin at week 0 and 4 as loading doses. Then 1 injection (100mg )  every 8 weeks    Lab tests required prior to treatment initiation:  Tuberculosis: Tuberculosis screening resulted in a non-reactive Quantiferon TB Gold assay.    Administration:       Systems developer all supplies needed for injection on a clean, flat working surface: medication pen removed from packaging, alcohol swab, sharps container, etc.  Look at the medication label - look for correct medication, correct dose, and check the expiration date  Look at the medication - the liquid visible in the window on the side of the pen device should appear clear and colorless to slightly yellow and may contain tiny white or clear particles  Lay the auto-injector pen on a flat surface and allow it to warm up to room temperature for at least 30 minutes  Select injection site - you can use the front of your thigh or your belly (but not the area 2 inches around your belly button); if someone else is giving you the injection you can also use your upper arm in the skin covering your triceps muscle  Prepare injection site - wash your hands and clean the skin at the injection site with an alcohol swab and let it air dry, do not touch the injection site again before the injection  Twist and pull off the bottom safety cap, do not remove until immediately prior to injection and do not touch the white needle cover  Position the injector against your skin at the injection site at a 90 degree angle, hold the pen such that you can see the clear medication window  Push the injection handle straight down, the medication will inject as you push so push at the speed that is comfortable for you  The injection is complete when the handle is pushed all the way down - you will hear a click and the teal body of the device is no longer visible; once this is complete then pull the pen straight up and away from your skin; the yellow band indicates that the needle guard is locked  Dispose of the used auto-injector pen immediately in your sharps disposal container the needle will be covered automatically  If you see any blood at the injection site, press a cotton ball or gauze on the site and maintain pressure until the bleeding stops, do not rub the injection site      Adherence/Missed dose instructions:  If your injection is given more than 7 days after your scheduled injection date - consult your pharmacist for additional instructions on how to adjust your dosing schedule.    Goals of Therapy     Minimize areas of skin involvement (% BSA)  Avoidance of long term glucocorticoid use  Maintenance of effective psychosocial functioning      Side Effects & Monitoring Parameters     Injection site reaction (redness, irritation, inflammation localized to the site of administration)  Signs of a common cold - minor sore throat, runny or stuffy nose, etc.  Joint pain  Headache    The following side effects should be reported to the provider:  Signs of a hypersensitivity reaction - rash; hives; itching; red, swollen, blistered, or peeling skin; wheezing; tightness in the chest or throat; difficulty breathing, swallowing, or talking; swelling of the mouth, face, lips, tongue, or throat; etc.  Reduced immune function - report signs of infection such as fever; chills; body aches; very bad sore throat; ear or sinus pain; cough; more sputum or change in color of sputum; pain with passing urine; wound that will not heal, etc.  Also at a slightly higher risk of some malignancies (mainly skin and blood cancers) due to this reduced immune function.  In the case of signs of infection - the patient should hold the next dose of Tremfya?? and call your primary care provider to ensure adequate medical care.  Treatment may be resumed when infection is treated and patient is asymptomatic.  Cough with or without phlegm or blood  Shortness of breath  Stomach pain or diarrhea      Contraindications, Warnings, & Precautions     Have your bloodwork checked as you have been told by your prescriber  Talk with your doctor if you are pregnant, planning to become pregnant, or breastfeeding  Discuss the possible need for holding your dose(s) of Tremfya?? when a planned procedure is scheduled with the prescriber as it may delay healing/recovery timeline       Drug/Food Interactions     Medication list reviewed in Epic. The patient was instructed to inform the care team before taking any new medications or supplements. No drug interactions identified.   Talk with you prescriber or pharmacist before receiving any live vaccinations while taking this medication and after you stop taking it    Storage, Handling Precautions, & Disposal     Store this medication in the refrigerator.  Do not freeze  If needed, you may store at room temperature for up to 4 hours  Store in original packaging, protected from light  Do not shake  Dispose of used syringes/pens in a sharps disposal container      Current Medications (including OTC/herbals), Comorbidities and Allergies     Current Outpatient Medications   Medication Sig Dispense Refill    acetaminophen (TYLENOL) 500 MG tablet Take 2 tablets (1,000 mg total) by mouth every four (4) hours as needed.      clindamycin (CLEOCIN T) 1 % lotion Apply topically to affected bumps two (2) times a  until resolved. 60 mL 5    escitalopram oxalate (LEXAPRO) 10 MG tablet Take 1 tablet (10 mg total) by mouth daily. 90 tablet 0    guselkumab (TREMFYA) 100 mg/mL AtIn Inject the contents of 1 syringe (100 mg total)  under the skin every 8 weeks. 4 mL 3    ketoconazole (NIZORAL) 2 % shampoo Apply to scalp for 5-10 minutes, then wash off. Can use 1-2 times a week. 120 mL 10    leuCOVorin (WELLCOVORIN) 5 mg tablet Take 4 tablets (20 mg total) by mouth every seven (7) days. Take 12-24 hours after methotrexate. 48 tablet 3    meloxicam (MOBIC) 15 MG tablet Take 1  tablet (15 mg total) by mouth daily. 90 tablet 1    menthol/camphor (CAMPHOR-MENTHOL) 4-10 % Crea Apply topically.      methotrexate, PF, (RASUVO, PF,) 20 mg/0.4 mL AtIn Inject the contents of 1 pen (20 mg) under the skin every seven (7) days. 4.8 mL 3    predniSONE (DELTASONE) 5 MG tablet Take 3 qd x 1 wk, 2 qd x 1 wk, 1 qd x 1 wk, then stop. 42 tablet 0     No current facility-administered medications for this visit.       Allergies   Allergen Reactions    Prednisone Hives    Sulfa (Sulfonamide Antibiotics) Hives     Other reaction(s): HIVES  Other reaction(s): HIVES    Sulfasalazine      Other reaction(s): HIVES    Enbrel [Etanercept] Rash       Patient Active Problem List   Diagnosis    Health care maintenance    Allergic rhinitis    Fibromyalgia    Carpal tunnel syndrome of right wrist    GERD (gastroesophageal reflux disease)    Chronic pain syndrome    Morbid obesity due to excess calories (CMS-HCC)    Psoriasis    IBS (irritable bowel syndrome)    History of tobacco use    Menopause    Psoriatic arthritis (CMS-HCC)    Cataracts, both eyes    Immunocompromised (CMS-HCC)    Seborrheic dermatitis    Furunculosis    Hidradenitis suppurativa    Rash    Keratosis pilaris    Painful and cold upper extremity    Painful and cold lower extremity    Abnormal finding of blood chemistry, unspecified    Low iron       Reviewed and up to date in Epic.    Appropriateness of Therapy     Acute infections noted within Epic:  No active infections  Patient reported infection: None    Is medication and dose appropriate based on diagnosis and infection status? Yes    Prescription has been clinically reviewed: Yes      Baseline Quality of Life Assessment      How many days over the past month did your psoriatic arthritis  keep you from your normal activities? For example, brushing your teeth or getting up in the morning. Patient declined to answer    Financial Information     Medication Assistance provided: None Required    Anticipated copay of $11.20 reviewed with patient. Verified delivery address.    Delivery Information     Scheduled delivery date: 3/25    Expected start date: 3/28    Patient was notified of new phone menu: Yes    Medication will be delivered via Same Day Courier to the prescription address in [facility].  This shipment will not require a signature.      Explained the services we provide at [facility] and that each month we would call to set up refills.  Stressed importance of returning phone calls so that we could ensure they receive their medications in time each month.  Informed patient that we should be setting up refills 7-10 days prior to when they will run out of medication.  A pharmacist will reach out to perform a clinical assessment periodically.  Informed patient that a welcome packet, containing information about our pharmacy and other support services, a Notice of Privacy Practices, and a drug information handout will be sent.  The patient or caregiver noted above participated in the development of this care plan and knows that they can request review of or adjustments to the care plan at any time.      Patient or caregiver verbalized understanding of the above information as well as how to contact the pharmacy at [phone] option 4 with any questions/concerns.  The pharmacy is open Monday through Friday 8:30am-4:30pm.  A pharmacist is available 24/7 via pager to answer any clinical questions they may have.    Patient Specific Needs     Does the patient have any physical, cognitive, or cultural barriers? No    Does the patient have adequate living arrangements? (i.e. the ability to store and take their medication appropriately) Yes    Did you identify any home environmental safety or security hazards? No    Patient prefers to have medications discussed with  Patient     Is the patient or caregiver able to read and understand education materials at a high school level or above? Yes    Patient's primary language is  English     Is the patient high risk? No    SOCIAL DETERMINANTS OF HEALTH     At the [facility], we have learned that life circumstances - like trouble affording food, housing, utilities, or transportation can affect the health of many of our patients.   That is why we wanted to ask: are you currently experiencing any life circumstances that are negatively impacting your health and/or quality of life? No    Social Determinants of Health     Financial Resource Strain: Medium Risk (04/09/2022)    Overall Financial Resource Strain (CARDIA)     Difficulty of Paying Living Expenses: Somewhat hard   Internet Connectivity: Not on file   Food Insecurity: No Food Insecurity (04/09/2022)    Hunger Vital Sign     Worried About Running Out of Food in the Last Year: Never true     Ran Out of Food in the Last Year: Never true   Tobacco Use: Medium Risk (01/29/2023)    Patient History     Smoking Tobacco Use: Former     Smokeless Tobacco Use: Never     Passive Exposure: Past   Housing/Utilities: Low Risk  (04/09/2022)    Housing/Utilities     Within the past 12 months, have you ever stayed: outside, in a car, in a tent, in an overnight shelter, or temporarily in someone else's home (i.e. couch-surfing)?: No     Are you worried about losing your housing?: No     Within the past 12 months, have you been unable to get utilities (heat, electricity) when it was really needed?: No   Alcohol Use: Not At Risk (04/09/2022)    Alcohol Use     How often do you have a drink containing alcohol?: Never     How many drinks containing alcohol do you have on a typical day when you are drinking?: Not on file     How often do you have 5 or more drinks on one occasion?: Never   Transportation Needs: No Transportation Needs (04/09/2022)    PRAPARE - Transportation     Lack of Transportation (Medical): No     Lack of Transportation (Non-Medical): No   Substance Use: Low Risk  (04/09/2022)    Substance Use     Taken prescription drugs for non-medical reasons: Never     Taken illegal drugs: Never  Patient indicated they have taken drugs in the past year for non-medical reasons: Yes, [positive answer(s)]: Not on file   Health Literacy: Not on file   Physical Activity: Not on file   Interpersonal Safety: Not on file   Stress: Not on file   Intimate Partner Violence: Not on file   Depression: Not at risk (06/23/2022)    PHQ-2     PHQ-2 Score: 0   Social Connections: Not on file       Would you be willing to receive help with any of the needs that you have identified today? Not applicable       Julianne Rice, PharmD  [facility] Specialty Pharmacist

## 2023-03-09 ENCOUNTER — Ambulatory Visit: Admit: 2023-03-09 | Discharge: 2023-03-10 | Payer: MEDICARE | Attending: Family | Primary: Family

## 2023-03-09 DIAGNOSIS — F419 Anxiety disorder, unspecified: Principal | ICD-10-CM

## 2023-03-09 DIAGNOSIS — Z1231 Encounter for screening mammogram for malignant neoplasm of breast: Principal | ICD-10-CM

## 2023-03-09 DIAGNOSIS — R921 Mammographic calcification found on diagnostic imaging of breast: Principal | ICD-10-CM

## 2023-03-09 DIAGNOSIS — R7309 Other abnormal glucose: Principal | ICD-10-CM

## 2023-03-09 LAB — HEMOGLOBIN A1C
ESTIMATED AVERAGE GLUCOSE: 143 mg/dL
HEMOGLOBIN A1C: 6.6 % — ABNORMAL HIGH (ref 4.8–5.6)

## 2023-03-09 MED ORDER — ESCITALOPRAM 10 MG TABLET
ORAL_TABLET | Freq: Every day | ORAL | 3 refills | 90 days | Status: CP
Start: 2023-03-09 — End: 2024-03-08
  Filled 2023-03-14: qty 90, 90d supply, fill #0

## 2023-03-12 DIAGNOSIS — L4050 Arthropathic psoriasis, unspecified: Principal | ICD-10-CM

## 2023-03-12 DIAGNOSIS — Z79631 Methotrexate, long term, current use: Principal | ICD-10-CM

## 2023-03-14 MED FILL — LEUCOVORIN CALCIUM 5 MG TABLET: ORAL | 84 days supply | Qty: 48 | Fill #0

## 2023-03-14 MED FILL — RASUVO (PF) 20 MG/0.4 ML SUBCUTANEOUS AUTO-INJECTOR: SUBCUTANEOUS | 84 days supply | Qty: 4.8 | Fill #0

## 2023-03-14 MED FILL — MELOXICAM 15 MG TABLET: ORAL | 90 days supply | Qty: 90 | Fill #0

## 2023-03-29 DIAGNOSIS — R7309 Other abnormal glucose: Principal | ICD-10-CM

## 2023-04-11 NOTE — Unmapped (Signed)
Patient has not used the 1st Tremfya yet.  She finished her previous Cosentyx instead.  She will use 1st Tremfya on Saturday, and then will be due 5/25 for next loading of Tremfya.       Mary Waters  [facility]   [phone] opt [phone]

## 2023-05-01 MED ORDER — CLOBETASOL 0.05 % SCALP SOLUTION
Freq: Two times a day (BID) | TOPICAL | 5 refills | 0.00000 days
Start: 2023-05-01 — End: 2024-04-30

## 2023-05-01 MED ORDER — CLINDAMYCIN 1 % LOTION
Freq: Two times a day (BID) | TOPICAL | 5 refills | 0.00000 days
Start: 2023-05-01 — End: ?

## 2023-05-01 MED ORDER — KETOCONAZOLE 2 % SHAMPOO
10 refills | 0.00000 days
Start: 2023-05-01 — End: ?

## 2023-05-01 NOTE — Unmapped (Addendum)
Dermatology Note     Assessment and Plan:      Dermatosis Papulosis Nigra (DPN)  - informed patient that these are benign skin growths commonly seen in patients with skin of color  - reassured that these are not cancerous, therefore we do not treat this condition in our clinic  - if desires treatment, can pay cosmetic fee for lesion removal    Scattered few hyperpigmented patches to lower extremities, suspect post-inflammatory hyperpigmentation related to previously resolved psoriasis or external trauma v venous stasis changes:  Patient on low dose prednisone for psoriasis, makes skin more fragile and easily traumatized   - Counseled to moisturize skin regularly with heavy creams. Recommendations included in AVS.   - Patient should return for reevaluation if spots changing in anyway or becoming symptomatic     Onycholysis  Nail changes in setting of psoriasis:  - Counseled that onycholysis is related to nail lifting off of the nail bed, which can be promoted by frequent wet/dry hands. 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   -Previous treatments: topical corticosteroids, Cosentyx  -Continue clobetasol 0.05% ointment BID for active rash, stop when smooth. Refilled.   -Diagnosis, treatment options, prognosis, risk/ benefit, and side effects were discussed with the patient     The patient was advised to call for an appointment should any new, changing, or symptomatic lesions develop.     RTC: Return in about 1 year (around 05/01/2024) for Annual physical. or sooner as needed   _________________________________________________________________  Chief Complaint     Chief Complaint   Patient presents with Lesion Of Concern     Lesions of concern on neck and eye,patch on left leg possible psoriasis     HPI     Mary Waters is a 57 y.o. female who presents as a returning patient (last seen 12/07/2021) to Dermatology for multiple lesions of concern. Patient has a history of palmoplantar psoriasis previously on Cosentyx via rheumatologist, now on methotrexate, Leucovorin, and prednisone (dispensed 02/24/2023 for 21 day supply 5mg ) (?Tremfya). Has a history of seb derm on ketoconazole shampoo and clobetasol solution. History of HS on clindamycin solution and BPO wash. Today patient reports, she has dark spots on her fae and neck that she thinks are moles and wants to have the one on her neck checked. The spot on her neck is itchy when things rub against it. The spots on her face are darker and getting bigger, but none are obstructing her vision. She also noticed a dark patch to her L leg that she doesn't recall any history of trauma to. Had a recent bug bite on the R leg that healed in darker, similar to what she is seeing on the left leg. It's asymptomatic. She reports having a history of folliculitis that flared with prednisone and when the bumps healed, they were darker spots for a little while before they resolved. She has used clobetasol ointment for psoriasis flares and possibly for the folliculitis too, and she prefers the thicker ointment, would like a refill.     The patient denies any other new or changing lesions or areas of concern.     Pertinent Past Medical History  Psoriasis with PsA  [facility] [place] Stage I    Problem List       Psoriasis - Primary    Relevant Medications    clobetasol (TEMOVATE) 0.05 % ointment    Seborrheic dermatitis    Relevant Medications    clobetasol (TEMOVATE) 0.05 % ointment    Hidradenitis suppurativa    Relevant Medications    clobetasol (TEMOVATE) 0.05 % ointment     Past Medical History, Family History, Social History, Medication List, Allergies, and Problem List were reviewed in the rooming section of Epic.     ROS: Other than symptoms mentioned in the HPI, no fevers, chills, or other skin complaints    Physical Examination     GENERAL: Well-appearing female in no acute distress, resting comfortably.  NEURO: Alert and oriented, answers questions appropriately  SKIN: Examination of the face, neck, and bilateral lower extremities was performed  - Seborrheic Keratosis(es): Stuck-on appearing keratotic papule(s) on the left neck and face, none irritated with redness, crusting, edema, and/or partial avulsion  - L shin with smooth faint hyperpigmented patch  - R shin with smooth hyperpigmented round patch at site of prior bug bite    All areas not commented on are within normal limits or unremarkable      (Approved Template 09/01/2020)

## 2023-05-02 ENCOUNTER — Ambulatory Visit: Admit: 2023-05-02 | Discharge: 2023-05-03 | Payer: MEDICARE

## 2023-05-02 DIAGNOSIS — L821 Other seborrheic keratosis: Principal | ICD-10-CM

## 2023-05-02 DIAGNOSIS — L810 Postinflammatory hyperpigmentation: Principal | ICD-10-CM

## 2023-05-02 DIAGNOSIS — L409 Psoriasis, unspecified: Principal | ICD-10-CM

## 2023-05-02 DIAGNOSIS — L601 Onycholysis: Principal | ICD-10-CM

## 2023-05-02 MED ORDER — CLOBETASOL 0.05 % TOPICAL OINTMENT
Freq: Two times a day (BID) | TOPICAL | 1 refills | 0.00000 days | Status: CP
Start: 2023-05-02 — End: 2024-05-01

## 2023-05-02 NOTE — Unmapped (Addendum)
Plan Summary:    - Apply Vaseline to your nails and nail folds nightly     - We refilled your clobetasol ointment today     - Moisturize several times a day with a heavy cream: Vanicream, CereVe, Cetaphil, Eucerin             Thank you for choosing [facility].  If you have any other questions or concerns, please contact us using [facility] or call [phone].      If you would like to get into contact with your dermatologist, please call our office at [phone] or, for non-urgent questions, send a MyChart message to the attending physician for the visit. Any message sent to the visit's attending physician on MyChart will be routed to the resident physician who participated in your care. Please allow 3 business days for response via MyChart.    [facility] releases most results to you as soon as they are available. Therefore, you may see some results before we do. Please give [place] 3 business days to review the tests and contact you by phone or through MyChart. If you are concerned that some results may be upsetting or confusing, you may wish to wait until we contact you before looking at the report in MyChart. If you have an urgent question, you can send [place] a message or call our clinic. Otherwise, we prefer that you wait 3 business days for [place] to contact you.

## 2023-05-04 NOTE — Unmapped (Signed)
Addended by: Inis Sizer A on: 05/04/2023 09:43 AM     Modules accepted: Level of Service

## 2023-05-12 ENCOUNTER — Ambulatory Visit: Admit: 2023-05-12 | Discharge: 2023-05-12 | Payer: MEDICARE

## 2023-05-12 DIAGNOSIS — L409 Psoriasis, unspecified: Principal | ICD-10-CM

## 2023-05-12 DIAGNOSIS — E559 Vitamin D deficiency, unspecified: Principal | ICD-10-CM

## 2023-05-12 DIAGNOSIS — L4050 Arthropathic psoriasis, unspecified: Principal | ICD-10-CM

## 2023-05-12 DIAGNOSIS — Z79631 Methotrexate, long term, current use: Principal | ICD-10-CM

## 2023-05-12 LAB — AST: AST (SGOT): 19 U/L (ref <=34)

## 2023-05-12 LAB — CBC W/ AUTO DIFF
BASOPHILS ABSOLUTE COUNT: 0 10*9/L (ref 0.0–0.1)
BASOPHILS RELATIVE PERCENT: 0.6 %
EOSINOPHILS ABSOLUTE COUNT: 0.1 10*9/L (ref 0.0–0.5)
EOSINOPHILS RELATIVE PERCENT: 1.6 %
HEMATOCRIT: 34.4 % (ref 34.0–44.0)
HEMOGLOBIN: 11.2 g/dL — ABNORMAL LOW (ref 11.3–14.9)
LYMPHOCYTES ABSOLUTE COUNT: 2 10*9/L (ref 1.1–3.6)
LYMPHOCYTES RELATIVE PERCENT: 39.5 %
MEAN CORPUSCULAR HEMOGLOBIN CONC: 32.6 g/dL (ref 32.0–36.0)
MEAN CORPUSCULAR HEMOGLOBIN: 29.2 pg (ref 25.9–32.4)
MEAN CORPUSCULAR VOLUME: 89.7 fL (ref 77.6–95.7)
MEAN PLATELET VOLUME: 7.2 fL (ref 6.8–10.7)
MONOCYTES ABSOLUTE COUNT: 0.5 10*9/L (ref 0.3–0.8)
MONOCYTES RELATIVE PERCENT: 8.9 %
NEUTROPHILS ABSOLUTE COUNT: 2.5 10*9/L (ref 1.8–7.8)
NEUTROPHILS RELATIVE PERCENT: 49.4 %
PLATELET COUNT: 283 10*9/L (ref 150–450)
RED BLOOD CELL COUNT: 3.83 10*12/L — ABNORMAL LOW (ref 3.95–5.13)
RED CELL DISTRIBUTION WIDTH: 14.2 % (ref 12.2–15.2)
WBC ADJUSTED: 5.1 10*9/L (ref 3.6–11.2)

## 2023-05-12 LAB — CREATININE
CREATININE: 0.71 mg/dL
EGFR CKD-EPI (2021) FEMALE: >90 mL/min/{1.73_m2} (ref >=60)

## 2023-05-12 LAB — C-REACTIVE PROTEIN: C-REACTIVE PROTEIN: 27 mg/L — ABNORMAL HIGH (ref <=10.0)

## 2023-05-12 LAB — SEDIMENTATION RATE: ERYTHROCYTE SEDIMENTATION RATE: 119 mm/h — ABNORMAL HIGH (ref 0–30)

## 2023-05-12 LAB — ALT: ALT (SGPT): 10 U/L (ref 10–49)

## 2023-05-12 NOTE — Unmapped (Signed)
Continue current medications    No changes today

## 2023-05-12 NOTE — Unmapped (Signed)
[facility] Clinical Assessment & Refill Coordination Note    Mary Waters, DOB: 04-Aug-1966  Phone: [phone] (home)     - pt said 1st injection did not go well and said it was confusing having to push the injection down herself. She said she will try it one more time but may not want to continue using if she can't get the injection technique down. Declined offer for me to re-send video demonstration but did agree to try another dose.   - also experienced more overall itching after taking 1st dose but also said she was finishing a course of prednisone and prednisone makes her itch so says it might not be from the [place]  All above HIPAA information was verified with patient.     Was a Nurse, learning disability used for this call? No    Specialty Medication(s):   Inflammatory Disorders: Tremfya     Current Outpatient Medications   Medication Sig Dispense Refill    acetaminophen (TYLENOL) 500 MG tablet Take 2 tablets (1,000 mg total) by mouth every four (4) hours as needed.      clindamycin (CLEOCIN T) 1 % lotion Apply topically to affected bumps two (2) times a  until resolved. 60 mL 5    clobetasol (TEMOVATE) 0.05 % ointment Apply topically two (2) times a day. To active rash, stop once smooth 60 g 1    escitalopram oxalate (LEXAPRO) 10 MG tablet Take 1 tablet (10 mg total) by mouth daily. 90 tablet 3    guselkumab (TREMFYA) 100 mg/mL AtIn Inject the contents of 1 syringe (100 mg total)  under the skin every 8 weeks. (Patient not taking: Reported on 03/09/2023) 4 mL 3    guselkumab (TREMFYA) 100 mg/mL AtIn Inject the contents of 1 pen (100mg ) under the skin at week 0 and 4 as loading doses. 2 mL 0    ketoconazole (NIZORAL) 2 % shampoo Apply to scalp for 5-10 minutes, then wash off. Can use 1-2 times a week. 120 mL 10    leuCOVorin (WELLCOVORIN) 5 mg tablet Take 4 tablets (20 mg total) by mouth every seven (7) days. Take 12-24 hours after methotrexate. 48 tablet 3    meloxicam (MOBIC) 15 MG tablet Take 1 tablet (15 mg total) by mouth daily. 90 tablet 1    methotrexate, PF, (RASUVO, PF,) 20 mg/0.4 mL AtIn Inject the contents of 1 pen (20 mg) under the skin every seven (7) days. 4.8 mL 3    predniSONE (DELTASONE) 5 MG tablet Take 3 qd x 1 wk, 2 qd x 1 wk, 1 qd x 1 wk, then stop. 42 tablet 0     No current facility-administered medications for this visit.        Changes to medications: [name] reports no changes at this time.    Allergies   Allergen Reactions    Prednisone Hives     03/09/2023 States she can take Prednisone but it makes her hot spots itch.    Sulfa (Sulfonamide Antibiotics) Hives     Other reaction(s): HIVES  Other reaction(s): HIVES    Sulfasalazine      Other reaction(s): HIVES    Enbrel [Etanercept] Rash       Changes to allergies: No    SPECIALTY MEDICATION ADHERENCE     Tremfya 100 mg/ml: 0 days of medicine on hand   Medication Adherence    Patient reported X missed doses in the last month: 0  Specialty Medication: Tremfya 100mg /ml  Patient is on additional specialty medications: No  Patient is on more than two specialty medications: No  Informant: patient          Specialty medication(s) dose(s) confirmed: Regimen is correct and unchanged.     Are there any concerns with adherence? No    Adherence counseling provided? Not needed    CLINICAL MANAGEMENT AND INTERVENTION      Clinical Benefit Assessment:    Do you feel the medicine is effective or helping your condition? No    Clinical Benefit counseling provided? Reasonable expectations discussed: advised on time to efficacy and will follow up before 1st maintenance dose    Adverse Effects Assessment:    Are you experiencing any side effects? No    Are you experiencing difficulty administering your medicine? No    Quality of Life Assessment:    Quality of Life    Rheumatology  1. What impact has your specialty medication had on the reduction of your daily pain level?: Minimal  2. What impact has your specialty medication had on your ability to complete daily tasks (prepare meals, get dressed, etc...)?Marland Kitchen Minimal  Oncology  Dermatology  Cystic Fibrosis          How many days over the past month did your PsA  keep you from your normal activities? For example, brushing your teeth or getting up in the morning. Patient declined to answer    Have you discussed this with your provider? Not needed    Acute Infection Status:    Acute infections noted within Epic:  No active infections  Patient reported infection: None    Therapy Appropriateness:    Is therapy appropriate and patient progressing towards therapeutic goals? Yes, therapy is appropriate and should be continued    DISEASE/MEDICATION-SPECIFIC INFORMATION      For patients on injectable medications: Patient currently has 0 doses left.  Next injection is scheduled for 05/13/23.    Chronic Inflammatory Diseases: Have you experienced any flares in the last month? No  Has this been reported to your provider? Not applicable    PATIENT SPECIFIC NEEDS     Does the patient have any physical, cognitive, or cultural barriers? No    Is the patient high risk? No    Did the patient require a clinical intervention? No    Does the patient require physician intervention or other additional services (i.e., nutrition, smoking cessation, social work)? No    SOCIAL DETERMINANTS OF HEALTH     At the [facility], we have learned that life circumstances - like trouble affording food, housing, utilities, or transportation can affect the health of many of our patients.   That is why we wanted to ask: are you currently experiencing any life circumstances that are negatively impacting your health and/or quality of life? Patient declined to answer    Social Determinants of Health     Financial Resource Strain: Medium Risk (03/09/2023)    Overall Financial Resource Strain (CARDIA)     Difficulty of Paying Living Expenses: Somewhat hard   Internet Connectivity: Not on file   Food Insecurity: No Food Insecurity (03/09/2023)    Hunger Vital Sign     Worried About Running Out of Food in the Last Year: Never true     Ran Out of Food in the Last Year: Never true   Tobacco Use: Medium Risk (05/12/2023)    Patient History     Smoking Tobacco Use: Former     Smokeless Tobacco  Use: Never     Passive Exposure: Past   Housing/Utilities: Low Risk  (03/09/2023)    Housing/Utilities     Within the past 12 months, have you ever stayed: outside, in a car, in a tent, in an overnight shelter, or temporarily in someone else's home (i.e. couch-surfing)?: No     Are you worried about losing your housing?: No     Within the past 12 months, have you been unable to get utilities (heat, electricity) when it was really needed?: No   Alcohol Use: Not At Risk (04/09/2022)    Alcohol Use     How often do you have a drink containing alcohol?: Never     How many drinks containing alcohol do you have on a typical day when you are drinking?: Not on file     How often do you have 5 or more drinks on one occasion?: Never   Transportation Needs: No Transportation Needs (03/09/2023)    PRAPARE - Transportation     Lack of Transportation (Medical): No     Lack of Transportation (Non-Medical): No   Substance Use: Low Risk  (03/09/2023)    Substance Use     Taken prescription drugs for non-medical reasons: Never     Taken illegal drugs: Never     Patient indicated they have taken drugs in the past year for non-medical reasons: Yes, [positive answer(s)]: Not on file   Health Literacy: Not on file   Physical Activity: Not on file   Interpersonal Safety: Not on file   Stress: Not on file   Intimate Partner Violence: Not on file   Depression: Not at risk (06/23/2022)    PHQ-2     PHQ-2 Score: 0   Social Connections: Not on file       Would you be willing to receive help with any of the needs that you have identified today? Not applicable       SHIPPING     Specialty Medication(s) to be Shipped:   Inflammatory Disorders: Tremfya    Other medication(s) to be shipped:  clobetasol     Changes to insurance: No    Delivery Scheduled: Yes, Expected medication delivery date: 05/13/23.     Medication will be delivered via Same Day Courier to the confirmed prescription address in [facility].    The patient will receive a drug information handout for each medication shipped and additional FDA Medication Guides as required.  Verified that patient has previously received a Conservation officer, historic buildings and a Surveyor, mining.    The patient or caregiver noted above participated in the development of this care plan and knows that they can request review of or adjustments to the care plan at any time.      All of the patient's questions and concerns have been addressed.    Darryl Nestle, PharmD   [facility] Specialty Pharmacist

## 2023-05-12 NOTE — Unmapped (Signed)
Patient Name: Mary Waters  PCP: ??Loran Senters, FNP  Source of History: patient and records  Date of Visit: 05/12/23 8:21 AM    Chief Compliant: follow-up for PsA    PRIOR RHEUMATOLOGIC HISTORY:?? History of psoriatic arthritis.  Established care in September 2016.  Ultrasound in 2016 showing small PIP joint effusions, presence of enthesophytes, and achilles tendon inflammation/tendinitis consistent with an underlying spondyloarthropathy such as psoriatic arthritis.    Treatment history:  - Humira 2016-06/2016 with loss of efficacy  - Methotrexate added 01/2016 for persistent peripheral arthritis.   - Enbrel 06/2016-09/2017 with loss of efficacy  - Cimzia 09/2017-07/2019, in office injections.   - Cosentyx started 07/2019, mtx switched to SQ dosing 07/2019  - [place] hands 12/2018 showing partially treated psoriatic arthritis with continued tendosynovitis. Pt felt improvement in hand pain after this [place] so no medication changes were made.  - [place] hands 02/2023 showing tendonitis extensor tendon R wrist and 3rd finger. Recommend switch from Cosentyx to Tremfya 100 mg q0, q4 and every 8 weeks thereafter  - Current med regimen: Tremfya 100 mg every 8 weeks (still in loading phase today), rasuvo 20 mg qwk, leucovorin 20 mg q wk.      Additional hx of cervical myelopathy for which surgery has been recommended, but she is not planning to pursue this. Has numbness and paresthesias in both hands.  She also has clinical evidence of fibromyalgia.      Current med regimen: Tremfya 100 mg every 8 weeks, Rasuvo 20 mg qwk, leucovorin 20 mg q wk.     HPI: Mary Waters is a 56 y.o. female who presents for her follow-up for psoriatic arthritis with known cervical myelopathy. Last evaluated by me in July 2023 with relatively stable PsA and development of trochanteric bursitis for which exercise was recommended. Seen in December 2023 with Mary Waters [facility] without clear physical findings but history concerning for active PsA so arranged for ultrasound. Completed diagnostic ultrasound in March 2023 showing tenosynovitis concerning for incomplete PsA treatment. Given this, Mary Waters [facility] recommended switch from Cosentyx (secukinumamb -  IL17) to Tremfya (guselkumab-IL23). She presents to clinic in person today in follow-up to recent medication change..    Patient received that took her first Guselkumab on April 25th. Her next dose will be on May 25th. However, she did not like the first dose of guselkumab as the injection pen does not click for her to know if it worked. In addition, she developed severe pruritus with a redness under her axillary region that seemed similar to prior history of fungal rash. She used diaper cream and golds bond powder to manage and rash is less. She is due to for 2nd dose but is not interested in taking it. She was taking prednisone around the time she took first guselkumab and had an outbreak of folliculitis with that as well. She saw Dermatology on May 13th but she did not have them look at the skin in axillary or back region. No history for blood clots. She does have history for diverticulitis but last event was many years ago. No history for hypertension. Discussed consideration for Remicade or a biosimilar for infusion. We discussed Jak inhibitors with risks for MACE, VTE and bowel perforation. After this discussion, she agrees to try Tremfya one more time. Discussed she is still in loading phase and needs to give it more time. She is not on prednisone on anymore as well.  She reports compliance with Rasuvo and Leucovorin.  Fears IV more than needle injections.    ROS??: Attests to the above, otherwise, review of all other systems is negative.   ????  Past Medical and Surgical History:  ??  Patient Active Problem List    Diagnosis Date Noted    Painful and cold upper extremity 04/09/2022    Painful and cold lower extremity 04/09/2022    Abnormal finding of blood chemistry, unspecified 04/09/2022 Low iron 04/09/2022    Seborrheic dermatitis 12/07/2021    Furunculosis 12/07/2021    Hidradenitis suppurativa 12/07/2021    Rash 12/07/2021    Keratosis pilaris 12/07/2021    Immunocompromised (CMS-HCC) 07/22/2021    Psoriatic arthritis (CMS-HCC) 01/07/2016    Menopause 08/07/2015    History of tobacco use 10/23/2014    Fibromyalgia 04/01/2014    Carpal tunnel syndrome of right wrist 04/01/2014    GERD (gastroesophageal reflux disease) 04/01/2014    Chronic pain syndrome 04/01/2014    Morbid obesity due to excess calories (CMS-HCC) 04/01/2014    Psoriasis 04/01/2014    Allergic rhinitis 02/04/2014    Cataracts, both eyes 10/03/2013    Health care maintenance 12/23/2010    IBS (irritable bowel syndrome) 12/20/2008     Past Surgical History:   Procedure Laterality Date    CESAREAN SECTION      DILATION AND CURETTAGE OF UTERUS      HYSTERECTOMY  2001    TAH    KNEE SURGERY      TOTAL ABDOMINAL HYSTERECTOMY       Allergies:   ??  Allergies   Allergen Reactions    Prednisone Hives     03/09/2023 States she can take Prednisone but it makes her hot spots itch.    Sulfa (Sulfonamide Antibiotics) Hives     Other reaction(s): HIVES  Other reaction(s): HIVES    Sulfasalazine      Other reaction(s): HIVES    Enbrel [Etanercept] Rash     Current Outpatient Medications:  ??  Current Outpatient Medications on File Prior to Visit   Medication Sig Dispense Refill    acetaminophen (TYLENOL) 500 MG tablet Take 2 tablets (1,000 mg total) by mouth every four (4) hours as needed.      clindamycin (CLEOCIN T) 1 % lotion Apply topically to affected bumps two (2) times a  until resolved. 60 mL 5    clobetasol (TEMOVATE) 0.05 % ointment Apply topically two (2) times a day. To active rash, stop once smooth 60 g 1    escitalopram oxalate (LEXAPRO) 10 MG tablet Take 1 tablet (10 mg total) by mouth daily. 90 tablet 3    ketoconazole (NIZORAL) 2 % shampoo Apply to scalp for 5-10 minutes, then wash off. Can use 1-2 times a week. 120 mL 10 leuCOVorin (WELLCOVORIN) 5 mg tablet Take 4 tablets (20 mg total) by mouth every seven (7) days. Take 12-24 hours after methotrexate. 48 tablet 3    meloxicam (MOBIC) 15 MG tablet Take 1 tablet (15 mg total) by mouth daily. 90 tablet 1    methotrexate, PF, (RASUVO, PF,) 20 mg/0.4 mL AtIn Inject the contents of 1 pen (20 mg) under the skin every seven (7) days. 4.8 mL 3    predniSONE (DELTASONE) 5 MG tablet Take 3 qd x 1 wk, 2 qd x 1 wk, 1 qd x 1 wk, then stop. 42 tablet 0    guselkumab (TREMFYA) 100 mg/mL AtIn Inject the contents of 1 syringe (100 mg total)  under the skin every 8 weeks. (Patient  not taking: Reported on 03/09/2023) 4 mL 3     No current facility-administered medications on file prior to visit.   ??  ??  Immunization History   Administered Date(s) Administered    COVID-19 VAC,BIVALENT(28YR UP),PFIZER 02/24/2022    COVID-19 VACC,MRNA,(PFIZER)(PF) 03/02/2020, 03/22/2020, 09/16/2020    COVID-19 VACCINE,MRNA(MODERNA)(PF) 07/22/2021    Influenza Vaccine Quad(IM)6 MO-Adult(PF) 09/26/2015, 10/14/2016, 12/02/2017, 11/07/2018, 10/03/2019, 10/08/2020, 10/21/2021, 12/08/2022    PNEUMOCOCCAL POLYSACCHARIDE 23-VALENT 03/04/2016    PPD Test 09/12/2013    Pneumococcal Conjugate 13-Valent 10/23/2015    Rubella 01/10/1979    TdaP 09/12/2013     ??  PHYSICAL EXAM?  Vital signs: BP 114/79 (BP Site: L Arm, BP Position: Sitting, BP Cuff Size: X-Large)  - Pulse 89  - Temp 36.5 ??C (97.7 ??F) (Temporal)  - Wt (!) 112.4 kg (247 lb 12.8 oz)  - BMI 37.69 kg/m?? Body mass index is 37.69 kg/m??.  Gen: Well-developed, well-nourished adult in no apparent distress. Normocephalic with no external signs of trauma. Pleasant and cooperative. AOx4.?  HEENT: PERRLA, EOMI, mask in place  Lungs: Broad chest excursion with good air movement. CTAB without wheezing/rhonchi/rales. ??  CV: RRR, Normal S1/S2, No murmurs/rubs/gallops heard.  PV: Warm, 2+ radial and pedal pulses, no C/C/E.??  Neuro: Good comprehension/cognition. CN 2-12 intact. Muscle strength 5/5 in all extremities. Gait normal.??  Comprehensive Musculoskeletal Examination:??  Jaw, neck without limited ROM.??  Shoulders, elbows, wrists, hands, fingers: Mild tenderness at right 2nd, 3rd, and 4th PIP but no MCP or DIP tenderness otherwise. Right 3rd PIP slightly swollen. Unable to make tight fist bilaterally but grip strength intact that is stable. Wrists slightly limited bilaterally but no pain. Elbows and shoulders wnl  Bilateral hips with good ROM.   Knee, ankles, feet, toes: No deformity, erythema, warmth, swelling, effusion, tenderness, limited ROM. Knee stable to valgus/varus stress and anterior/posterior drawer sign.?? Unable to elicit right plantar fasciitis tenderness.   Skin: No psoriasis noted. No rash noted in axillary region or around bra line currently    LABORATORY-persistently elevated inflammatory markers but exam does suggest disease activity.  Recent Results (from the past 168 hour(s))   Creatinine    Collection Time: 05/12/23  9:33 AM   Result Value Ref Range    Creatinine 0.71 0.55 - 1.02 mg/dL    eGFR CKD-EPI (1610) Female >90 >=60 mL/min/1.21m2   AST    Collection Time: 05/12/23  9:33 AM   Result Value Ref Range    AST 19 <=34 U/L   ALT    Collection Time: 05/12/23  9:33 AM   Result Value Ref Range    ALT 10 10 - 49 U/L   CRP  C-Reactive Protein    Collection Time: 05/12/23  9:33 AM   Result Value Ref Range    CRP 27.0 (H) <=10.0 mg/L   ESR Sed rate    Collection Time: 05/12/23  9:33 AM   Result Value Ref Range    Sed Rate 119 (H) 0 - 30 mm/h   CBC w/ Differential    Collection Time: 05/12/23  9:33 AM   Result Value Ref Range    WBC 5.1 3.6 - 11.2 10*9/L    RBC 3.83 (L) 3.95 - 5.13 10*12/L    HGB 11.2 (L) 11.3 - 14.9 g/dL    HCT 96.0 45.4 - 09.8 %    MCV 89.7 77.6 - 95.7 fL    MCH 29.2 25.9 - 32.4 pg    MCHC 32.6 32.0 - 36.0 g/dL  RDW 14.2 12.2 - 15.2 %    MPV 7.2 6.8 - 10.7 fL    Platelet 283 150 - 450 10*9/L    Neutrophils % 49.4 %    Lymphocytes % 39.5 % Monocytes % 8.9 %    Eosinophils % 1.6 %    Basophils % 0.6 %    Absolute Neutrophils 2.5 1.8 - 7.8 10*9/L    Absolute Lymphocytes 2.0 1.1 - 3.6 10*9/L    Absolute Monocytes 0.5 0.3 - 0.8 10*9/L    Absolute Eosinophils 0.1 0.0 - 0.5 10*9/L    Absolute Basophils 0.0 0.0 - 0.1 10*9/L     ??IMAGING?? - no recent    ??GENERAL SUMMARY AND IMPRESSION: ??  ??  In summary, the patient is a 57 y.o. female here for follow-up for psoriatic arthritis on methotrexate 20 mg subcutaneous weekly injections followed by leucovorin 20 mg once a week and transitioning onto Tremfya. She does have active PsA on exam but has only received one dose of Tremfya and is due for 2nd now. She was reluctant to continue medication but after discussion of several other options she decided to continue with current regimen.   Lab monitoring for potential medication toxicity and disease activity completed with results including inflammatory marker persistently elevated. Follow-up in 4 months with Mary [facility] (should have received 3-4 injections by then) and myself in 8 months.  Return in about 8 months (around 01/11/2024).      RECOMMENDATIONS: ??  ????   Diagnosis ICD-10-CM Associated Orders   1. Psoriatic arthritis (CMS-HCC)  L40.50 CBC w/ Differential     Creatinine     AST     ALT     CRP  C-Reactive Protein     ESR Sed rate      2. Methotrexate, long term, current use  Z79.631 CBC w/ Differential     Creatinine     AST     ALT     CRP  C-Reactive Protein     ESR Sed rate      3. Vitamin D deficiency  E55.9 25 OH Vit D            Patient Instructions   Continue current medications. No changes today.   The patient indicates understanding of these issues and agrees to the plan as outlined above.  Contact information provided for any concerns or questions in the interim.    I personally spent 29 minutes face-to-face and non-face-to-face in the care of this patient, which includes all pre, intra, and post visit time on the date of service.  All documented time was specific to the E/M visit and does not include any procedures that may have been performed.    Jaciel Diem C. Scarlette Calico, MD, PhD  Assistant Professor of Medicine  Department of Medicine/Division of Rheumatology  [facility]  4:27 PM

## 2023-05-13 ENCOUNTER — Ambulatory Visit: Admit: 2023-05-13 | Discharge: 2023-05-13 | Payer: MEDICARE

## 2023-05-13 DIAGNOSIS — R921 Mammographic calcification found on diagnostic imaging of breast: Principal | ICD-10-CM

## 2023-05-13 MED FILL — TREMFYA 100 MG/ML SUBCUTANEOUS AUTO-INJECTOR: SUBCUTANEOUS | 84 days supply | Qty: 1 | Fill #1

## 2023-05-17 LAB — VITAMIN D 25 HYDROXY: VITAMIN D, TOTAL (25OH): 19.6 ng/mL — ABNORMAL LOW (ref 20.0–80.0)

## 2023-05-18 MED ORDER — CLOBETASOL 0.05 % TOPICAL CREAM
Freq: Two times a day (BID) | TOPICAL | 3 refills | 0.00000 days | Status: CP
Start: 2023-05-18 — End: 2024-05-17
  Filled 2023-07-01: qty 60, 30d supply, fill #0

## 2023-05-20 DIAGNOSIS — E559 Vitamin D deficiency, unspecified: Principal | ICD-10-CM

## 2023-05-20 MED ORDER — ERGOCALCIFEROL (VITAMIN D2) 1,250 MCG (50,000 UNIT) CAPSULE
ORAL_CAPSULE | ORAL | 2 refills | 28 days | Status: CP
Start: 2023-05-20 — End: 2023-08-06
  Filled 2023-07-01: qty 4, 28d supply, fill #0

## 2023-06-29 NOTE — Unmapped (Signed)
[facility] Clinical Assessment & Refill Coordination Note    -Mary Waters is going well, she did not have any issues pushing down the activator.     Mary Waters, DOB: 09/16/1966  Phone: [phone] (home)     All above HIPAA information was verified with patient.     Was a Nurse, learning disability used for this call? No    Specialty Medication(s):   Inflammatory Disorders: Rasuvo and Tremfya     Current Outpatient Medications   Medication Sig Dispense Refill    acetaminophen (TYLENOL) 500 MG tablet Take 2 tablets (1,000 mg total) by mouth every four (4) hours as needed.      clindamycin (CLEOCIN T) 1 % lotion Apply topically to affected bumps two (2) times a  until resolved. 60 mL 5    clobetasol (TEMOVATE) 0.05 % cream Apply topically two (2) times a day to active rash until smooth, then stop 60 g 3    ergocalciferol-1,250 mcg, 50,000 unit, (VITAMIN D2-1,250 MCG, 50,000 UNIT,) 1,250 mcg (50,000 unit) capsule Take 1 capsule (1,250 mcg total) by mouth once a week for 12 doses. 4 capsule 2    escitalopram oxalate (LEXAPRO) 10 MG tablet Take 1 tablet (10 mg total) by mouth daily. 90 tablet 3    guselkumab (TREMFYA) 100 mg/mL AtIn Inject the contents of 1 syringe (100 mg total)  under the skin every 8 weeks. (Patient not taking: Reported on 03/09/2023) 4 mL 3    guselkumab (TREMFYA) 100 mg/mL AtIn Inject the contents of 1 pen (100mg ) under the skin at week 0 and 4 as loading doses. 2 mL 0    ketoconazole (NIZORAL) 2 % shampoo Apply to scalp for 5-10 minutes, then wash off. Can use 1-2 times a week. 120 mL 10    leuCOVorin (WELLCOVORIN) 5 mg tablet Take 4 tablets (20 mg total) by mouth every seven (7) days. Take 12-24 hours after methotrexate. 48 tablet 3    meloxicam (MOBIC) 15 MG tablet Take 1 tablet (15 mg total) by mouth daily. 90 tablet 1    methotrexate, PF, (RASUVO, PF,) 20 mg/0.4 mL AtIn Inject the contents of 1 pen (20 mg) under the skin every seven (7) days. 4.8 mL 3    predniSONE (DELTASONE) 5 MG tablet Take 3 qd x 1 wk, 2 qd x 1 wk, 1 qd x 1 wk, then stop. 42 tablet 0     No current facility-administered medications for this visit.        Changes to medications: [name] reports no changes at this time.    Allergies   Allergen Reactions    Prednisone Hives     03/09/2023 States she can take Prednisone but it makes her hot spots itch.    Sulfa (Sulfonamide Antibiotics) Hives     Other reaction(s): HIVES  Other reaction(s): HIVES    Sulfasalazine      Other reaction(s): HIVES    Enbrel [Etanercept] Rash       Changes to allergies: No    SPECIALTY MEDICATION ADHERENCE   d     Medication Adherence    Patient reported X missed doses in the last month: 0  Specialty Medication: Tremfya  Patient is on additional specialty medications: No  Informant: patient          Specialty medication(s) dose(s) confirmed: Regimen is correct and unchanged.     Are there any concerns with adherence? No    Adherence counseling provided? Not needed  CLINICAL MANAGEMENT AND INTERVENTION      Clinical Benefit Assessment:    Do you feel the medicine is effective or helping your condition? Yes--It hasn't been enough time to make a big difference.  The little psoriasis patch on her hand has been improving.  Aches and pains in the finger have eased back a lot.  Getting out of the bed and sitting down for a long time it is easier to get back up.     Clinical Benefit counseling provided? Progress note from 05/12/23 shows evidence of clinical benefit    Adverse Effects Assessment:    Are you experiencing any side effects? No    Are you experiencing difficulty administering your medicine? No    Quality of Life Assessment:    Quality of Life    Rheumatology  Oncology  Dermatology  Cystic Fibrosis          How many days over the past month did your psoriatic arthritis  keep you from your normal activities? For example, brushing your teeth or getting up in the morning. More painful when she works outside in the yard or vacuum the floor.     Have you discussed this with your provider? Not needed    Acute Infection Status:    Acute infections noted within Epic:  No active infections  Patient reported infection: None    Therapy Appropriateness:    Is therapy appropriate and patient progressing towards therapeutic goals? Yes, therapy is appropriate and should be continued    DISEASE/MEDICATION-SPECIFIC INFORMATION      For patients on injectable medications: Patient currently has 0 doses left.  Next injection is scheduled for 7/19.    Chronic Inflammatory Diseases: Have you experienced any flares in the last month? No    PATIENT SPECIFIC NEEDS     Does the patient have any physical, cognitive, or cultural barriers? No    Is the patient high risk? No    Did the patient require a clinical intervention? No    Does the patient require physician intervention or other additional services (i.e., nutrition, smoking cessation, social work)? No    SOCIAL DETERMINANTS OF HEALTH     At the [facility], we have learned that life circumstances - like trouble affording food, housing, utilities, or transportation can affect the health of many of our patients.   That is why we wanted to ask: are you currently experiencing any life circumstances that are negatively impacting your health and/or quality of life? Patient declined to answer    Social Determinants of Health     Financial Resource Strain: Medium Risk (03/09/2023)    Overall Financial Resource Strain (CARDIA)     Difficulty of Paying Living Expenses: Somewhat hard   Internet Connectivity: Not on file   Food Insecurity: No Food Insecurity (03/09/2023)    Hunger Vital Sign     Worried About Running Out of Food in the Last Year: Never true     Ran Out of Food in the Last Year: Never true   Tobacco Use: Medium Risk (05/12/2023)    Patient History     Smoking Tobacco Use: Former     Smokeless Tobacco Use: Never     Passive Exposure: Past   Housing/Utilities: Low Risk  (03/09/2023)    Housing/Utilities Within the past 12 months, have you ever stayed: outside, in a car, in a tent, in an overnight shelter, or temporarily in someone else's home (i.e. couch-surfing)?: No  Are you worried about losing your housing?: No     Within the past 12 months, have you been unable to get utilities (heat, electricity) when it was really needed?: No   Alcohol Use: Not At Risk (04/09/2022)    Alcohol Use     How often do you have a drink containing alcohol?: Never     How many drinks containing alcohol do you have on a typical day when you are drinking?: Not on file     How often do you have 5 or more drinks on one occasion?: Never   Transportation Needs: No Transportation Needs (03/09/2023)    PRAPARE - Transportation     Lack of Transportation (Medical): No     Lack of Transportation (Non-Medical): No   Substance Use: Low Risk  (03/09/2023)    Substance Use     Taken prescription drugs for non-medical reasons: Never     Taken illegal drugs: Never     Patient indicated they have taken drugs in the past year for non-medical reasons: Yes, [positive answer(s)]: Not on file   Health Literacy: Not on file   Physical Activity: Not on file   Interpersonal Safety: Unknown (06/29/2023)    Interpersonal Safety     Unsafe Where You Currently Live: Not on file     Physically Hurt by Anyone: Not on file     Abused by Anyone: Not on file   Stress: Not on file   Intimate Partner Violence: Not on file   Depression: Not at risk (06/23/2022)    PHQ-2     PHQ-2 Score: 0   Social Connections: Not on file       Would you be willing to receive help with any of the needs that you have identified today? Not applicable       SHIPPING     Specialty Medication(s) to be Shipped:   Inflammatory Disorders: Tremfya    Other medication(s) to be shipped: No additional medications requested for fill at this time     Changes to insurance: No    Delivery Scheduled: Yes, Expected medication delivery date: 7/12.     Medication will be delivered via Same Day Courier to the confirmed prescription address in [facility].    The patient will receive a drug information handout for each medication shipped and additional FDA Medication Guides as required.  Verified that patient has previously received a Conservation officer, historic buildings and a Surveyor, mining.    The patient or caregiver noted above participated in the development of this care plan and knows that they can request review of or adjustments to the care plan at any time.      All of the patient's questions and concerns have been addressed.    Julianne Rice, PharmD   [facility] Specialty Pharmacist

## 2023-07-01 MED FILL — RASUVO (PF) 20 MG/0.4 ML SUBCUTANEOUS AUTO-INJECTOR: SUBCUTANEOUS | 84 days supply | Qty: 4.8 | Fill #1

## 2023-07-01 MED FILL — TREMFYA 100 MG/ML SUBCUTANEOUS AUTO-INJECTOR: SUBCUTANEOUS | 56 days supply | Qty: 1 | Fill #0

## 2023-07-01 MED FILL — LEUCOVORIN CALCIUM 5 MG TABLET: ORAL | 84 days supply | Qty: 48 | Fill #1

## 2023-08-09 MED FILL — ERGOCALCIFEROL (VITAMIN D2) 1,250 MCG (50,000 UNIT) CAPSULE: ORAL | 28 days supply | Qty: 4 | Fill #1

## 2023-08-29 NOTE — Unmapped (Signed)
[facility] Refill Coordination Note    Specialty Medication(s) to be Shipped:   Inflammatory Disorders: Tremfya    Other medication(s) to be shipped:  vitamin d     Mary Waters, DOB: February 24, 1966  Phone: [phone] (home)       All above HIPAA information was verified with patient.     Was a Nurse, learning disability used for this call? No    Completed refill call assessment today to schedule patient's medication shipment from the [facility] [phone]).  All relevant notes have been reviewed.     Specialty medication(s) and dose(s) confirmed: Regimen is correct and unchanged.   Changes to medications: Jerilee reports starting the following medications: mobic  Changes to insurance: No  New side effects reported not previously addressed with a pharmacist or physician: Yes - Patient reports itching. Patient would not like to speak to the pharmacist today. Their provider is aware.  Questions for the pharmacist: No    Confirmed patient received a Conservation officer, historic buildings and a Surveyor, mining with first shipment. The patient will receive a drug information handout for each medication shipped and additional FDA Medication Guides as required.       DISEASE/MEDICATION-SPECIFIC INFORMATION        For patients on injectable medications: Patient currently has 0 doses left.  Next injection is scheduled for asap.    SPECIALTY MEDICATION ADHERENCE     Medication Adherence    Patient reported X missed doses in the last month: 0  Specialty Medication: TREMFYA 100 mg/mL Atin (guselkumab)  Patient is on additional specialty medications: No  Informant: patient              Were doses missed due to medication being on hold? No     TREMFYA 100 mg/mL Atin (guselkumab): 0 days of medicine on hand       REFERRAL TO PHARMACIST     Referral to the pharmacist: Not needed      [facility]     Shipping address confirmed in Epic.       Delivery Scheduled: Yes, Expected medication delivery date: 08/30/23. Medication will be delivered via Same Day Courier to the prescription address in Epic [facility].    Craige Cotta   [facility] Specialty Technician

## 2023-08-30 MED FILL — TREMFYA 100 MG/ML SUBCUTANEOUS AUTO-INJECTOR: SUBCUTANEOUS | 56 days supply | Qty: 1 | Fill #1

## 2023-08-30 MED FILL — ERGOCALCIFEROL (VITAMIN D2) 1,250 MCG (50,000 UNIT) CAPSULE: ORAL | 28 days supply | Qty: 4 | Fill #2

## 2023-09-13 ENCOUNTER — Ambulatory Visit: Admit: 2023-09-13 | Discharge: 2023-09-14 | Payer: MEDICARE

## 2023-09-13 DIAGNOSIS — R7309 Other abnormal glucose: Principal | ICD-10-CM

## 2023-09-13 DIAGNOSIS — E559 Vitamin D deficiency, unspecified: Principal | ICD-10-CM

## 2023-09-13 DIAGNOSIS — L4050 Arthropathic psoriasis, unspecified: Principal | ICD-10-CM

## 2023-09-13 DIAGNOSIS — Z79631 Methotrexate, long term, current use: Principal | ICD-10-CM

## 2023-09-13 LAB — CBC W/ AUTO DIFF
BASOPHILS ABSOLUTE COUNT: 0.1 10*9/L (ref 0.0–0.1)
BASOPHILS RELATIVE PERCENT: 1.1 %
EOSINOPHILS ABSOLUTE COUNT: 0.1 10*9/L (ref 0.0–0.5)
EOSINOPHILS RELATIVE PERCENT: 2.3 %
HEMATOCRIT: 37 % (ref 34.0–44.0)
HEMOGLOBIN: 11.7 g/dL (ref 11.3–14.9)
LYMPHOCYTES ABSOLUTE COUNT: 2.6 10*9/L (ref 1.1–3.6)
LYMPHOCYTES RELATIVE PERCENT: 44.8 %
MEAN CORPUSCULAR HEMOGLOBIN CONC: 31.7 g/dL — ABNORMAL LOW (ref 32.0–36.0)
MEAN CORPUSCULAR HEMOGLOBIN: 28.6 pg (ref 25.9–32.4)
MEAN CORPUSCULAR VOLUME: 90.1 fL (ref 77.6–95.7)
MEAN PLATELET VOLUME: 6.9 fL (ref 6.8–10.7)
MONOCYTES ABSOLUTE COUNT: 0.6 10*9/L (ref 0.3–0.8)
MONOCYTES RELATIVE PERCENT: 10.3 %
NEUTROPHILS ABSOLUTE COUNT: 2.5 10*9/L (ref 1.8–7.8)
NEUTROPHILS RELATIVE PERCENT: 41.5 %
PLATELET COUNT: 298 10*9/L (ref 150–450)
RED BLOOD CELL COUNT: 4.1 10*12/L (ref 3.95–5.13)
RED CELL DISTRIBUTION WIDTH: 14.2 % (ref 12.2–15.2)
WBC ADJUSTED: 5.9 10*9/L (ref 3.6–11.2)

## 2023-09-13 LAB — HEMOGLOBIN A1C
ESTIMATED AVERAGE GLUCOSE: 137 mg/dL
HEMOGLOBIN A1C: 6.4 % — ABNORMAL HIGH (ref 4.8–5.6)

## 2023-09-13 LAB — ALT: ALT (SGPT): 8 U/L — ABNORMAL LOW (ref 10–49)

## 2023-09-13 LAB — ALBUMIN: ALBUMIN: 4 g/dL (ref 3.4–5.0)

## 2023-09-13 LAB — AST: AST (SGOT): 14 U/L (ref <=34)

## 2023-09-13 LAB — CREATININE
CREATININE: 0.77 mg/dL
EGFR CKD-EPI (2021) FEMALE: >90 mL/min/{1.73_m2} (ref >=60)

## 2023-09-13 MED ORDER — RASUVO (PF) 20 MG/0.4 ML SUBCUTANEOUS AUTO-INJECTOR
SUBCUTANEOUS | 3 refills | 84 days | Status: CP
Start: 2023-09-13 — End: ?
  Filled 2023-10-24: qty 4.8, 84d supply, fill #0

## 2023-09-13 MED ORDER — LEUCOVORIN CALCIUM 5 MG TABLET
ORAL_TABLET | ORAL | 3 refills | 84 days | Status: CP
Start: 2023-09-13 — End: ?
  Filled 2023-10-24: qty 48, 84d supply, fill #0
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
## 2023-09-16 LAB — VITAMIN D 25 HYDROXY: VITAMIN D, TOTAL (25OH): 32.2 ng/mL (ref 20.0–80.0)
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
## 2023-10-21 NOTE — Unmapped (Signed)
[facility] Refill Coordination Note    Specialty Medication(s) to be Shipped:   Inflammatory Disorders: Rasuvo and Tremfya    Other medication(s) to be shipped:  leucovorin 5mg      Mary Waters, DOB: [birth year]  Phone: [phone] (home)       All above HIPAA information was verified with patient.     Was a Nurse, learning disability used for this call? No    Completed refill call assessment today to schedule patient's medication shipment from the [facility]  [phone]).  All relevant notes have been reviewed.     Specialty medication(s) and dose(s) confirmed: Regimen is correct and unchanged.   Changes to medications: [name] reports no changes at this time.  Changes to insurance: No  New side effects reported not previously addressed with a pharmacist or physician: None reported  Questions for the pharmacist: No    Confirmed patient received a Conservation officer, historic buildings and a Surveyor, mining with first shipment. The patient will receive a drug information handout for each medication shipped and additional FDA Medication Guides as required.       DISEASE/MEDICATION-SPECIFIC INFORMATION        For patients on injectable medications: Patient currently has 0 doses left.  Next injection is scheduled for 11/08/23 for Tremfya and 10/24/23 for Rasuvo.    SPECIALTY MEDICATION ADHERENCE     Medication Adherence    Patient reported X missed doses in the last month: 0  Specialty Medication: Rasuvo 20mg  q week  Patient is on additional specialty medications: Yes  Additional Specialty Medications: Tremfya 100mg  q 8 weeks  Patient Reported Additional Medication X Missed Doses in the Last Month: 0  Patient is on more than two specialty medications: No  Informant: patient              Were doses missed due to medication being on hold? No    Tremfya 100 mg/ml: 0 doses of medicine on hand   Rasuvo 20  mg/0.84ml : 0 doses of medicine on hand       REFERRAL TO PHARMACIST Referral to the pharmacist: Not needed      SHIPPING     Shipping address confirmed in Epic.       Delivery Scheduled: Yes, Expected medication delivery date: 10/24/23.     Medication will be delivered via Same Day Courier to the prescription address in Epic WAM.    Jasper Loser   [facility]  Specialty Technician

## 2023-10-24 MED FILL — TREMFYA 100 MG/ML SUBCUTANEOUS AUTO-INJECTOR: SUBCUTANEOUS | 56 days supply | Qty: 1 | Fill #2

## 2023-11-25 NOTE — Unmapped (Signed)
Tremfya last dispensed for 56 day supply on 10/24/23. Rescheduling outreach until 12/20.

## 2023-12-07 ENCOUNTER — Ambulatory Visit: Admit: 2023-12-07 | Discharge: 2023-12-08 | Payer: MEDICARE | Attending: Family | Primary: Family

## 2023-12-07 DIAGNOSIS — K219 Gastro-esophageal reflux disease without esophagitis: Principal | ICD-10-CM

## 2023-12-07 DIAGNOSIS — Z1211 Encounter for screening for malignant neoplasm of colon: Principal | ICD-10-CM

## 2023-12-07 DIAGNOSIS — L4050 Arthropathic psoriasis, unspecified: Principal | ICD-10-CM

## 2023-12-07 DIAGNOSIS — Z23 Encounter for immunization: Principal | ICD-10-CM

## 2023-12-07 DIAGNOSIS — F419 Anxiety disorder, unspecified: Principal | ICD-10-CM
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
## 2023-12-28 DIAGNOSIS — L4050 Arthropathic psoriasis, unspecified: Principal | ICD-10-CM

## 2023-12-29 NOTE — Unmapped (Signed)
 Pharmacy - Enhanced Care Program  Reason for Call: Pharmacy and Medication Access; Type: SSC Refill Call  Referral Request: Rheumatology - Sheliah Mends, CPP    Summary of Telephone Encounter  The [facility] has made multiple attempts to reach pt to coordinate refill of Tremfya. Please call [phone] Option 4, then Option 2:   I called pt at phone # [phone] to assist with refill coordination. I was not able to reach pt so I left a VM and provided the phone number to [facility] [phone] Option 4, then Option 2).  I also sent pt a my chart message and provided the contact number to [facility].   Follow-Up:  Continue to reach out to pt to assist with refill of Trenfya to prevent any gaps in treatment.     Call Attempt #: 1  Time Spent on Referral: 10 minutes  Number of Days Spent on Referral: 1    Aldean Ast, [facility]   Pharmacy Department  [facility]  [address]   [place], [place] [address]  [phone]

## 2023-12-30 NOTE — Unmapped (Signed)
The [facility] has made a second and final attempt to reach this patient to refill the following medication:TREMFYA 100 mg/mL Atin (guselkumab).      We have left voicemails on the following phone numbers: [phone], have sent a MyChart message, have sent a text message to the following phone numbers: [phone], and have sent a Mychart questionnaire..    Dates contacted: 12/22-1/2  Last scheduled delivery: 10/24/23    The patient may be at risk of non-compliance with this medication. The patient should call the [facility] at [phone]  Option 4, then Option 2: Dermatology, Gastroenterology, Rheumatology to refill medication.    Art therapist

## 2024-01-02 NOTE — Unmapped (Signed)
[facility] - Enhanced Care Program  Reason for Call: Pharmacy and Medication Access; Type: SSC Refill Call  Referral Request: Rheumatology - Sheliah Mends, CPP    Summary of Telephone Encounter  The [facility] has made multiple attempts to reach pt to coordinate refill of Tremfya. Please call [phone] Option 4, then Option 2:   I made another attempt to reach pt at phone # [phone] to assist with refill coordination. I was not able to reach pt . I left anohter VM and provided the phone number to [facility] [phone] Option 4, then Option 2).  I also sent pt a my chart message and provided the contact number to [facility].   Follow-Up:  Continue to reach out to pt to assist with refill of Trenfya to prevent any gaps in treatment.     Call Attempt #: 2  Time Spent on Referral: 15 minutes  Number of Days Spent on Referral: 2    Vertell Limber RN, BSN  [facility]   Pharmacy Department  [facility]  [address]   [place], [place] [address]  [phone]

## 2024-01-03 NOTE — Unmapped (Signed)
[facility] - Enhanced Care Program  Reason for Call: Pharmacy and Medication Access; Type: SSC Refill Call  Referral Request: Rheumatology - Sheliah Mends, CPP    Summary of Telephone Encounter  The [facility] has made multiple attempts to reach pt to coordinate refill of Tremfya. Please call [phone] Option 4, then Option 2.   I made another attempt to reach pt at phone # [phone] to assist with refill coordination. I was not able to reach pt . I left anohter VM and provided the phone number to [facility] [phone] Option 4, then Option 2).  I also left my callback number for the patient if she prefers for me to do a conference call.  My Chart message has not been read.  Follow-Up:  Continue to reach out to pt to assist with refill of Tremfya to prevent any gaps in treatment.     Call Attempt #: 3  Time Spent on Referral: 20 minutes  Number of Days Spent on Referral: 3    Marva Panda, Pharm.D.  Pharmacy Department  [facility]  [address]  [place], [place] [address]   [phone]

## 2024-01-05 NOTE — Unmapped (Addendum)
[facility] - Enhanced Care Program  Reason for Call: Pharmacy and Medication Access; Type: SSC Refill Call  Referral Request: Rheumatology - Sheliah Mends, CPP    Summary of Telephone Encounter  The [facility] and Home Delivery Pharmacy has made multiple attempts to reach pt to coordinate refill of Tremfya. Please call [phone] Option 4, then Option 2.   Pt's last delivery was scheduled on 10/24/23.  I made another attempt to reach pt at phone # [phone] to assist with refill coordination. I was not able to reach pt . I left another VM and provided the phone number to [facility] [phone] Option 4, then Option 2).  I also left my callback number for the patient if she prefers for me to do a conference call.  My Chart message has not been read.  Pt is scheduled with Dr. Scarlette Calico on 01/11/24 @ 2:40 pm.   Follow-Up:  Since pt has an apt with Rheumatology next wk I will close out the referral. If pt calls back we will be happy to assist with refill coordination.   Sheliah Mends, CPP was notified.      Call Attempt #: 4  Time Spent on Referral: 25 minutes  Number of Days Spent on Referral: 4      Vertell Limber RN, [facility]   Pharmacy Department  [facility]  [address]   [place], [place] [address]  [phone]

## 2024-01-09 LAB — COLOGUARD

## 2024-01-25 ENCOUNTER — Ambulatory Visit: Admit: 2024-01-25 | Discharge: 2024-01-26 | Payer: MEDICARE

## 2024-01-25 DIAGNOSIS — L4050 Arthropathic psoriasis, unspecified: Principal | ICD-10-CM

## 2024-01-25 DIAGNOSIS — Z1159 Encounter for screening for other viral diseases: Principal | ICD-10-CM

## 2024-01-25 DIAGNOSIS — Z79899 Other long term (current) drug therapy: Principal | ICD-10-CM

## 2024-01-25 LAB — CBC W/ AUTO DIFF
BASOPHILS ABSOLUTE COUNT: 0.1 10*9/L (ref 0.0–0.1)
BASOPHILS RELATIVE PERCENT: 1.1 %
EOSINOPHILS ABSOLUTE COUNT: 0.1 10*9/L (ref 0.0–0.5)
EOSINOPHILS RELATIVE PERCENT: 1.8 %
HEMATOCRIT: 37.5 % (ref 34.0–44.0)
HEMOGLOBIN: 12.2 g/dL (ref 11.3–14.9)
LYMPHOCYTES ABSOLUTE COUNT: 2.5 10*9/L (ref 1.1–3.6)
LYMPHOCYTES RELATIVE PERCENT: 40.3 %
MEAN CORPUSCULAR HEMOGLOBIN CONC: 32.4 g/dL (ref 32.0–36.0)
MEAN CORPUSCULAR HEMOGLOBIN: 28.9 pg (ref 25.9–32.4)
MEAN CORPUSCULAR VOLUME: 89.1 fL (ref 77.6–95.7)
MEAN PLATELET VOLUME: 7.1 fL (ref 6.8–10.7)
MONOCYTES ABSOLUTE COUNT: 0.6 10*9/L (ref 0.3–0.8)
MONOCYTES RELATIVE PERCENT: 9 %
NEUTROPHILS ABSOLUTE COUNT: 2.9 10*9/L (ref 1.8–7.8)
NEUTROPHILS RELATIVE PERCENT: 47.8 %
PLATELET COUNT: 306 10*9/L (ref 150–450)
RED BLOOD CELL COUNT: 4.21 10*12/L (ref 3.95–5.13)
RED CELL DISTRIBUTION WIDTH: 14.3 % (ref 12.2–15.2)
WBC ADJUSTED: 6.2 10*9/L (ref 3.6–11.2)

## 2024-01-25 LAB — COMPREHENSIVE METABOLIC PANEL
ALBUMIN: 3.8 g/dL (ref 3.4–5.0)
ALKALINE PHOSPHATASE: 93 U/L (ref 46–116)
ALT (SGPT): 11 U/L (ref 10–49)
ANION GAP: 11 mmol/L (ref 5–14)
AST (SGOT): 18 U/L (ref <=34)
BILIRUBIN TOTAL: 0.5 mg/dL (ref 0.3–1.2)
BLOOD UREA NITROGEN: 11 mg/dL (ref 9–23)
BUN / CREAT RATIO: 14
CALCIUM: 9.6 mg/dL (ref 8.7–10.4)
CHLORIDE: 99 mmol/L (ref 98–107)
CO2: 29 mmol/L (ref 20.0–31.0)
CREATININE: 0.78 mg/dL (ref 0.55–1.02)
EGFR CKD-EPI (2021) FEMALE: 89 mL/min/{1.73_m2} (ref >=60)
GLUCOSE RANDOM: 95 mg/dL (ref 70–179)
POTASSIUM: 4 mmol/L (ref 3.4–4.8)
PROTEIN TOTAL: 8.2 g/dL (ref 5.7–8.2)
SODIUM: 139 mmol/L (ref 135–145)

## 2024-01-25 LAB — C-REACTIVE PROTEIN: C-REACTIVE PROTEIN: 34 mg/L — ABNORMAL HIGH (ref <=10.0)

## 2024-01-25 LAB — SEDIMENTATION RATE: ERYTHROCYTE SEDIMENTATION RATE: 124 mm/h — ABNORMAL HIGH (ref 0–30)

## 2024-01-25 MED ORDER — RASUVO (PF) 15 MG/0.3 ML SUBCUTANEOUS AUTO-INJECTOR
SUBCUTANEOUS | 11 refills | 28.00 days | Status: CP
Start: 2024-01-25 — End: ?
  Filled 2024-01-30: qty 3.6, 84d supply, fill #0

## 2024-01-25 MED ORDER — RISANKIZUMAB-RZAA 150 MG/ML SUBCUTANEOUS PEN INJECTOR
5 refills | 0.00 days | Status: CP
Start: 2024-01-25 — End: ?

## 2024-01-25 NOTE — Unmapped (Signed)
Patient Name: Mary Waters  PCP: ??Loran Senters, FNP  Source of History: patient and records  Date of Visit: 01/25/24 2:34 PM    Chief Compliant: follow-up for PsA    PRIOR RHEUMATOLOGIC HISTORY:?? History of psoriatic arthritis.  Established care in September 2016.  Ultrasound in 2016 showing small PIP joint effusions, presence of enthesophytes, and achilles tendon inflammation/tendinitis consistent with an underlying spondyloarthropathy such as psoriatic arthritis.    Treatment history:  - Humira 2016-06/2016 with loss of efficacy  - Methotrexate added 01/2016 for persistent peripheral arthritis.   - Enbrel 06/2016-09/2017 with loss of efficacy  - Cimzia 09/2017-07/2019, in office injections.   - Cosentyx started 07/2019, mtx switched to SQ dosing 07/2019  - [place] hands 12/2018 showing partially treated psoriatic arthritis with continued tendosynovitis. Pt felt improvement in hand pain after this [place] so no medication changes were made.  - [place] hands 02/2023 showing tendonitis extensor tendon R wrist and 3rd finger. Recommend switch from Cosentyx to Tremfya 100 mg q0, q4 and every 8 weeks thereafter  - Tremfya started in April 2024  - Current med regimen: Tremfya 100 mg every 8 weeks (still in loading phase today), rasuvo 20 mg qwk, leucovorin 20 mg q wk.      Additional hx of cervical myelopathy for which surgery has been recommended, but she is not planning to pursue this. Has numbness and paresthesias in both hands.  She also has clinical evidence of fibromyalgia.      Current med regimen: Tremfya 100 mg every 8 weeks, Rasuvo 20 mg qwk, leucovorin 20 mg q wk.     HPI: Mary Waters is a 58 y.o. female who presents for her follow-up for psoriatic arthritis with known cervical myelopathy. Last evaluated by me in May 2024 with recent start on guselkumab so did not make changes. Seen by Carlus Pavlov [facility] in September 2024 and she reported improvements with three injections of Tremfya so no changes to regimen. She presents to clinic in person today in follow-up.    Today, patient reports she last used Tremfya in November 2024. She did not feel comfortable giving her credit card over the phone to pharmacy when asked so did not get the Tremfya when due in January 2025. She also had similar issues with obtaining Rasuvo so last dose was two weeks ago. She thought she could provide information at [facility] but was told not connected to [facility]. She had a death in the family two weeks ago so had to reschedule appointment to today. She reports Tremfya was helpful when on it but would having itching from the medication around the abdomen for several weeks following injection. She also has injection site reaction but the pruritus around abdomen bothered her more. She is also requesting Rasuvo dose be lowered. Reports more nausea on Rasuvo 20 mg vs Rasuvo 15 mg. In place of Tremfya, we discussed Orencia as an option. We also discussed Skyrizi as she had good results with IL23 but having side effects. She has history of diverticulitis so risk for bowel perforation with Jak Inhibitors. Today, she reports that her left achilles is having pain and this was initial presentation of her PsA. Hands are still well controlled. Minimal psoriasis. Has a dry patch over right 3rd MCP.     ROS??: Attests to the above, otherwise, review of all other systems is negative  ????  Past Medical and Surgical History:  ??  Patient Active Problem List  Diagnosis Date Noted    Painful and cold upper extremity 04/09/2022    Painful and cold lower extremity 04/09/2022    Abnormal finding of blood chemistry, unspecified 04/09/2022    Low iron 04/09/2022    Seborrheic dermatitis 12/07/2021    Furunculosis 12/07/2021    Hidradenitis suppurativa 12/07/2021    Rash 12/07/2021    Keratosis pilaris 12/07/2021    Immunocompromised (CMS-HCC) 07/22/2021    Psoriatic arthritis (CMS-HCC) 01/07/2016    Menopause 08/07/2015    History of tobacco use 10/23/2014    Fibromyalgia 04/01/2014    Carpal tunnel syndrome of right wrist 04/01/2014    GERD (gastroesophageal reflux disease) 04/01/2014    Chronic pain syndrome 04/01/2014    Morbid obesity due to excess calories (CMS-HCC) 04/01/2014    Psoriasis 04/01/2014    Allergic rhinitis 02/04/2014    Cataracts, both eyes 10/03/2013    Health care maintenance 12/23/2010    IBS (irritable bowel syndrome) 12/20/2008     Past Surgical History:   Procedure Laterality Date    CESAREAN SECTION      DILATION AND CURETTAGE OF UTERUS      HYSTERECTOMY  2001    TAH    KNEE SURGERY      TOTAL ABDOMINAL HYSTERECTOMY       Allergies:   ??  Allergies   Allergen Reactions    Prednisone Hives     03/09/2023 States she can take Prednisone but it makes her hot spots itch.    Sulfa (Sulfonamide Antibiotics) Hives     Other reaction(s): HIVES  Other reaction(s): HIVES    Sulfasalazine      Other reaction(s): HIVES    Enbrel [Etanercept] Rash    Red Dye Hives     Current Outpatient Medications:  ??  Current Outpatient Medications on File Prior to Visit   Medication Sig Dispense Refill    acetaminophen (TYLENOL) 500 MG tablet Take 2 tablets (1,000 mg total) by mouth every four (4) hours as needed.      clindamycin (CLEOCIN T) 1 % lotion Apply topically to affected bumps two (2) times a  until resolved. 60 mL 5    clobetasol (TEMOVATE) 0.05 % cream Apply topically two (2) times a day to active rash until smooth, then stop 60 g 3    escitalopram oxalate (LEXAPRO) 10 MG tablet Take 1 tablet (10 mg total) by mouth daily. 90 tablet 3    guselkumab (TREMFYA) 100 mg/mL AtIn Inject the contents of 1 syringe (100 mg total)  under the skin every 8 weeks. 4 mL 3    ketoconazole (NIZORAL) 2 % shampoo Apply to scalp for 5-10 minutes, then wash off. Can use 1-2 times a week. 120 mL 10    leuCOVorin (WELLCOVORIN) 5 mg tablet Take 4 tablets (20 mg total) by mouth every seven (7) days. Take 12-24 hours after methotrexate. 48 tablet 3 meloxicam (MOBIC) 15 MG tablet Take 1 tablet (15 mg total) by mouth daily. 90 tablet 1    methotrexate, PF, (RASUVO, PF,) 20 mg/0.4 mL AtIn Inject the contents of 1 pen (20 mg) under the skin every seven (7) days. 4.8 mL 3     No current facility-administered medications on file prior to visit.   ??  ??  Immunization History   Administered Date(s) Administered    COVID-19 VAC,BIVALENT(8YR UP),PFIZER 02/24/2022    COVID-19 VACC,MRNA,(PFIZER)(PF) 03/02/2020, 03/22/2020, 09/16/2020    COVID-19 VACCINE,MRNA(MODERNA)(PF) 07/22/2021    Covid-19 Vac, (22yr+) (Comirnaty) [employer]  09/13/2023  INFLUENZA VACCINE IIV3(IM)(PF)6 MOS UP 12/07/2023    Influenza Vaccine Quad(IM)6 MO-Adult(PF) 09/26/2015, 10/14/2016, 12/02/2017, 11/07/2018, 10/03/2019, 10/08/2020, 10/21/2021, 12/08/2022    PNEUMOCOCCAL POLYSACCHARIDE 23-Waters 03/04/2016    PPD Test 09/12/2013    Pneumococcal Conjugate 13-Waters 10/23/2015    Rubella 01/10/1979    TdaP 09/12/2013     ??  PHYSICAL EXAM?  Vital signs: Temp 36.6 ??C (97.9 ??F) (Temporal)  - Wt (!) 110.2 kg (243 lb)  - BMI 36.96 kg/m?? Body mass index is 36.96 kg/m??.  Gen: Well-developed, well-nourished adult in no apparent distress. Normocephalic with no external signs of trauma. Pleasant and cooperative. AOx4.?  HEENT: PERRLA, EOMI, mask in place  Lungs: Broad chest excursion with good air movement. CTAB without wheezing/rhonchi/rales. ??  CV: RRR, Normal S1/S2, No murmurs/rubs/gallops heard.  PV: Warm, 2+ radial and pedal pulses, no C/C/E.??  Neuro: Good comprehension/cognition. CN 2-12 intact.  Muscle strength 5/5 in all extremities. Gait normal.??  Comprehensive Musculoskeletal Examination:??  Jaw, neck without limited ROM.??  Shoulders, elbows, wrists, hands, fingers: Mild tenderness and swelling at right 2nd and 4th PIP. Right 3rd PIP slightly swollen. Left 2nd and 3rd MCP slightly swollen and tender. No other MCP, PIP or DIP swelling or tenderness. Unable to make tight fist bilaterally but grip strength intact that is stable. Wrists slightly limited bilaterally but no pain. Elbows and shoulders wnl  Bilateral hips with good ROM.  Modified schober 15 cm to 19 cm. Restricted by 1 cm.   Knee, ankles, feet, toes: Knee stable to valgus/varus stress and anterior/posterior drawer sign.?? Ankles with full ROm but notes pain and tenderness over left achilles that does feel thickened  Skin: dry skin scaling over right 3rd MCP    LABORATORY-persistently elevated inflammatory markers that are slightly more than her elevated baseline and exam is suggestive for increased activity since therapy disrupted due to cost and concerns for sharing credit card information over phone.  Recent Results (from the past week)   Comprehensive Metabolic Panel    Collection Time: 01/25/24  3:22 PM   Result Value Ref Range    Sodium 139 135 - 145 mmol/L    Potassium 4.0 3.4 - 4.8 mmol/L    Chloride 99 98 - 107 mmol/L    CO2 29.0 20.0 - 31.0 mmol/L    Anion Gap 11 5 - 14 mmol/L    BUN 11 9 - 23 mg/dL    Creatinine 1.61 0.96 - 1.02 mg/dL    BUN/Creatinine Ratio 14     eGFR CKD-EPI (2021) Female 89 >=60 mL/min/1.4m2    Glucose 95 70 - 179 mg/dL    Calcium 9.6 8.7 - 04.5 mg/dL    Albumin 3.8 3.4 - 5.0 g/dL    Total Protein 8.2 5.7 - 8.2 g/dL    Total Bilirubin 0.5 0.3 - 1.2 mg/dL    AST 18 <=40 U/L    ALT 11 10 - 49 U/L    Alkaline Phosphatase 93 46 - 116 U/L   CRP  C-Reactive Protein    Collection Time: 01/25/24  3:22 PM   Result Value Ref Range    CRP 34.0 (H) <=10.0 mg/L   ESR Sed rate    Collection Time: 01/25/24  3:22 PM   Result Value Ref Range    Sed Rate 124 (H) 0 - 30 mm/h   CBC w/ Differential    Collection Time: 01/25/24  3:22 PM   Result Value Ref Range    WBC 6.2 3.6 - 11.2 10*9/L  RBC 4.21 3.95 - 5.13 10*12/L    HGB 12.2 11.3 - 14.9 g/dL    HCT 78.2 95.6 - 21.3 %    MCV 89.1 77.6 - 95.7 fL    MCH 28.9 25.9 - 32.4 pg    MCHC 32.4 32.0 - 36.0 g/dL    RDW 08.6 57.8 - 46.9 %    MPV 7.1 6.8 - 10.7 fL    Platelet 306 150 - 450 10*9/L    Neutrophils % 47.8 %    Lymphocytes % 40.3 %    Monocytes % 9.0 %    Eosinophils % 1.8 %    Basophils % 1.1 %    Absolute Neutrophils 2.9 1.8 - 7.8 10*9/L    Absolute Lymphocytes 2.5 1.1 - 3.6 10*9/L    Absolute Monocytes 0.6 0.3 - 0.8 10*9/L    Absolute Eosinophils 0.1 0.0 - 0.5 10*9/L    Absolute Basophils 0.1 0.0 - 0.1 10*9/L       ??IMAGING?? - no recent    ??GENERAL SUMMARY AND IMPRESSION: ??  ??  In summary, the patient is a 58 y.o. female here for follow-up for psoriatic arthritis. She is supposed to be taking on methotrexate 20 mg subcutaneous weekly injections followed by leucovorin 20 mg once a week and Tremfya every 8 weeks but last received Tremfya in November 2024 and methotrexate two weeks ago.  Due to disrupted therapy she does have active PsA by history and exam. Will ask clinical pharmacist to help patient connect back with [facility] Specialty services and address concerns over providing payment information. In addition, patient had concerns about medication regimen due to side effects vs allergic reaction. Given this, will lower Rasuvo from 20 mg to 15 mg once a week. Will discontinue Tremfya and replace with Skyrizi as she does fine IL23 blockade helpful when able to tolerate. She also likes the regimen having less frequency. She is not interested in infusion related to medications. Labs today to rescreen as it has been some time since last screens done. Did not recheck hepatitis C as insurance would not cover and there would be cost to patient. Follow-up in 3 months with Carlus Pavlov [facility] and myself in 6 months.  Return in about 6 months (around 07/24/2024).      RECOMMENDATIONS: ??  ????   Diagnosis ICD-10-CM Associated Orders   1. Psoriatic arthritis (CMS-HCC) [L40.50]  L40.50 Hepatitis B Surface Antibody     Hepatitis B Surface Antigen     Hepatitis B Core Antibody, total     Quantiferon TB Gold Plus     CBC w/ Differential     Comprehensive Metabolic Panel     CRP  C-Reactive Protein ESR Sed rate     methotrexate, PF, (RASUVO, PF,) 15 mg/0.3 mL AtIn     risankizumab-rzaa (SKYRIZI) 150 mg/mL PnIj      2. High risk medication use  Z79.899 Hepatitis B Surface Antibody     Hepatitis B Surface Antigen     Hepatitis B Core Antibody, total     Quantiferon TB Gold Plus     CBC w/ Differential     Comprehensive Metabolic Panel     CRP  C-Reactive Protein     ESR Sed rate     methotrexate, PF, (RASUVO, PF,) 15 mg/0.3 mL AtIn     risankizumab-rzaa (SKYRIZI) 150 mg/mL PnIj      3. Encounter for screening for other viral diseases  Z11.59 Hepatitis B Surface Antibody  Hepatitis B Surface Antigen     Hepatitis B Core Antibody, total        Patient Instructions   Will have clinical pharmacy reach out to discuss concerns about need to provide debit/credit card to specialty pharmacy over phone.    Will switch you from Tremfya to [employer]    Will decrease dose of Rasuvo from 20 mg to 15 mg    Labs today  The patient indicates understanding of these issues and agrees to the plan as outlined above.  Contact information provided for any concerns or questions in the interim.    I personally spent 39 minutes face-to-face and non-face-to-face in the care of this patient, which includes all pre, intra, and post visit time on the date of service.  All documented time was specific to the E/M visit and does not include any procedures that may have been performed.    Jeanean Hollett C. Scarlette Calico, MD, PhD  Assistant Professor of Medicine  Department of Medicine/Division of Rheumatology  [facility]  9:46 PM

## 2024-01-25 NOTE — Unmapped (Signed)
Will have clinical pharmacy reach out to discuss concerns about need to provide debit/credit card to specialty pharmacy over phone.    Will switch you from Tremfya to [employer]    Will decrease dose of Rasuvo from 20 mg to 15 mg    Labs today

## 2024-01-26 LAB — COLOGUARD: COLOGUARD: NEGATIVE

## 2024-01-27 DIAGNOSIS — L4050 Arthropathic psoriasis, unspecified: Principal | ICD-10-CM

## 2024-01-27 DIAGNOSIS — Z79899 Other long term (current) drug therapy: Principal | ICD-10-CM

## 2024-01-27 LAB — HEPATITIS B CORE ANTIBODY, TOTAL: HEPATITIS B CORE TOTAL ANTIBODY: NONREACTIVE

## 2024-01-27 LAB — TB MITOGEN: TB MITOGEN VALUE: 10

## 2024-01-27 LAB — TB NIL: TB NIL VALUE: 0.09

## 2024-01-27 LAB — QUANTIFERON TB GOLD PLUS
QUANTIFERON ANTIGEN 1 MINUS NIL: 0.01 [IU]/mL
QUANTIFERON ANTIGEN 2 MINUS NIL: 0.01 [IU]/mL
QUANTIFERON MITOGEN: 9.91 [IU]/mL
QUANTIFERON TB GOLD PLUS: NEGATIVE
QUANTIFERON TB NIL VALUE: 0.09 [IU]/mL

## 2024-01-27 LAB — HEPATITIS B SURFACE ANTIGEN: HEPATITIS B SURFACE ANTIGEN: NONREACTIVE

## 2024-01-27 LAB — TB AG2: TB AG2 VALUE: 0.1

## 2024-01-27 LAB — TB AG1: TB AG1 VALUE: 0.1

## 2024-01-27 LAB — HEPATITIS B SURFACE ANTIBODY
HEPATITIS B SURFACE ANTIBODY QUANT: <8 m[IU]/mL (ref <8.00)
HEPATITIS B SURFACE ANTIBODY: NONREACTIVE

## 2024-01-27 MED ORDER — RISANKIZUMAB-RZAA 150 MG/ML SUBCUTANEOUS PEN INJECTOR
SUBCUTANEOUS | 0 refills | 0.00 days | Status: CP
Start: 2024-01-27 — End: 2024-02-25
  Filled 2024-02-15: qty 2, 84d supply, fill #0

## 2024-01-27 NOTE — Unmapped (Signed)
[facility] Specialty and Home Delivery Pharmacy Refill Coordination Note    Patient aware of $12.15 copay    Specialty Lite Medication(s) to be Shipped:   Rasuvo    Other medication(s) to be shipped: No additional medications requested for fill at this time     Mary Waters, DOB: [birth year]  Phone: [phone] (home)       All above HIPAA information was verified with patient.     Was a Nurse, learning disability used for this call? No    Changes to medications: Darneisha reports no changes at this time.  Changes to insurance: No      REFERRAL TO PHARMACIST     Referral to the pharmacist: Not needed      [facility]     Shipping address confirmed in Epic.     Delivery Scheduled: Yes, Expected medication delivery date: 2/10.     Medication will be delivered via Same Day Courier to the prescription address in Epic WAM.    Julianne Rice, PharmD   [facility] Special[facility] and Home Delivery Pharmacy Specialty Pharmacist

## 2024-01-30 DIAGNOSIS — L4050 Arthropathic psoriasis, unspecified: Principal | ICD-10-CM

## 2024-01-30 DIAGNOSIS — Z79899 Other long term (current) drug therapy: Principal | ICD-10-CM

## 2024-02-03 MED FILL — LEUCOVORIN CALCIUM 5 MG TABLET: ORAL | 90 days supply | Qty: 49 | Fill #1

## 2024-02-13 NOTE — Unmapped (Signed)
 [facility] Specialty Medication Onboarding    Specialty Medication: Careers adviser  Prior Authorization: Approved   Financial Assistance: No - copay  <$25  Final Copay/Day Supply: $12.15  / 56 days (LD) & 84 days (MD)    Insurance Restrictions: None     Notes to Pharmacist:   Credit Card on File: yes  Start Date on Rx:      The triage team has completed the benefits investigation and has determined that the patient is able to fill this medication at [facility]. Please contact the patient to complete the onboarding or follow up with the prescribing physician as needed.

## 2024-02-13 NOTE — Unmapped (Signed)
 [facility]    Patient Onboarding/Medication Counseling    Mary Waters is a 58 y.o. female with PsA who I am counseling today on initiation of therapy.  I am speaking to the patient.    Was a Nurse, learning disability used for this call? No    Verified patient's date of birth / HIPAA.    Specialty medication(s) to be sent: Inflammatory Disorders: Skyrizi      Non-specialty medications/supplies to be sent: n/a      Medications not needed at this time: sharps container (receiving from [facility])          Skyrizi (risankizumab)    Medication & Administration     Dosage: Psoriatic Arthritis: Inject 150mg  under the skin at weeks 0 and 4, then every 12 weeks thereafter    Lab tests required prior to treatment initiation:  Tuberculosis: Tuberculosis screening resulted in a non-reactive Quantiferon TB Gold assay.    Administration:       Pen:  Gather all supplies needed for injection on a clean, flat working surface: medication pen removed from packaging, alcohol swab, sharps container, etc.  Look at the medication label - look for correct medication, correct dose, and check the expiration date  Look at the medication through the medication inspection window - the liquid in the pen should appear clear and colorless to slightly yellow, you may see tiny white or clear particles  Lay the pen on a flat surface and allow it to warm up to room temperature for at least 30 minutes  Select injection site - you can use the front of your thigh or your belly (but not the area 2 inches around your belly button); if someone else is giving you the injection you can also use your upper arm in the skin covering your triceps muscle  Prepare injection site - wash your hands and clean the skin at the injection site with an alcohol swab and let it air dry, do not touch the injection site again before the injection  Hold the pen with your fingers on the gray grips so you can see the green activator button (it is on the side of the pen, not the top).  Pinch the skin - with your hand not holding the pen, pinch up a fold of skin at the injection site using your forefinger and thumb  Press the white needle sleeve of the pen down against the pinched skin, then press the green activator button.  You will hear a click, meaning the injection has started.  Note - the pen only activates if the white needle sleeve is pressed down before pressing the green activator button.  Continue holding the pen against your skin for 15 sections, until you hear a second click, or until the yellow indicator has filled the medication inspection window.  The injection is now complete.  Slowly pull the pen straight out from your skin and dispose of the used pen immediately in your sharps disposal container.   If you see any blood at the injection site, press a cotton ball or gauze on the site and maintain pressure until the bleeding stops, do not rub the injection site        Adherence/Missed dose instructions:  If your injection is given more than 7 days after your scheduled injection date - consult your pharmacist for additional instructions on how to adjust your dosing schedule.    Goals of Therapy       Psoriatic Arthritis  Achieve remission/inactive disease or low/minimal disease activity  Maintenance of function  Minimization of systemic manifestations and comorbidities  Maintenance of effective psychosocial functioning      Side Effects & Monitoring Parameters     Injection site reaction (redness, irritation, inflammation localized to the site of administration)  Signs of a common cold - minor sore throat, runny or stuffy nose, etc.  Felling tired/weak  Headache  Stomach, joint or back pain    The following side effects should be reported to the provider:  Signs of a hypersensitivity reaction - rash; hives; itching; red, swollen, blistered, or peeling skin; wheezing; tightness in the chest or throat; difficulty breathing, swallowing, or talking; swelling of the mouth, face, lips, tongue, or throat; etc.  Reduced immune function - report signs of infection such as fever; chills; body aches; very bad sore throat; ear or sinus pain; cough; more sputum or change in color of sputum; pain with passing urine; wound that will not heal, etc.  Also at a slightly higher risk of some malignancies (mainly skin and blood cancers) due to this reduced immune function.  In the case of signs of infection - the patient should hold the next dose of Skyrizi?? and call your primary care provider to ensure adequate medical care.  Treatment may be resumed when infection is treated and patient is asymptomatic.  Flu-like symptoms  Warm, red, or painful skin or sores on the body  Severe diarrhea or stomach pain      Contraindications, Warnings, & Precautions     Have your bloodwork checked as you have been told by your prescriber  Talk with your doctor if you are pregnant, planning to become pregnant, or breastfeeding  Discuss the possible need for holding your dose(s) of Skyrizi?? when a planned procedure is scheduled with the prescriber as it may delay healing/recovery timeline       Drug/Food Interactions     Medication list reviewed in Epic. The patient was instructed to inform the care team before taking any new medications or supplements. No drug interactions identified.   Talk with you prescriber or pharmacist before receiving any live vaccinations while taking this medication and after you stop taking it    Storage, Handling Precautions, & Disposal     Store this medication in the refrigerator.  Do not freeze   If needed, you may store at room temperature for up to 24 hours  Store in original packaging, protected from light  Do not shake  Dispose of used syringes/pens in a sharps disposal container          Current Medications (including OTC/herbals), Comorbidities and Allergies     Current Outpatient Medications   Medication Sig Dispense Refill    acetaminophen (TYLENOL) 500 MG tablet Take 2 tablets (1,000 mg total) by mouth every four (4) hours as needed.      clindamycin (CLEOCIN T) 1 % lotion Apply topically to affected bumps two (2) times a  until resolved. 60 mL 5    clobetasol (TEMOVATE) 0.05 % cream Apply topically two (2) times a day to active rash until smooth, then stop 60 g 3    escitalopram oxalate (LEXAPRO) 10 MG tablet Take 1 tablet (10 mg total) by mouth daily. 90 tablet 3    ketoconazole (NIZORAL) 2 % shampoo Apply to scalp for 5-10 minutes, then wash off. Can use 1-2 times a week. 120 mL 10    leuCOVorin (WELLCOVORIN) 5 mg tablet Take 4 tablets (20 mg total)  by mouth every seven (7) days. Take 12-24 hours after methotrexate. 48 tablet 3    meloxicam (MOBIC) 15 MG tablet Take 1 tablet (15 mg total) by mouth daily. 90 tablet 1    methotrexate, PF, (RASUVO, PF,) 15 mg/0.3 mL AtIn Inject 15 mg under the skin every seven (7) days. 1.2 mL 11    risankizumab-rzaa (SKYRIZI) 150 mg/mL PnIj Inject 150 mg under the skin every twenty-eight (28) days for 2 doses. Loading regimen. Take at week 0 and week 4. 2 mL 0    [START ON 05/25/2024] risankizumab-rzaa (SKYRIZI) 150 mg/mL PnIj Inject 150 mg under the skin Every three (3) months. Maintenance regimen to start after loading dose. Maintenance regimen is every 12 weeks (3 months). 1 mL 3     No current facility-administered medications for this visit.       Allergies   Allergen Reactions    Prednisone Hives     03/09/2023 States she can take Prednisone but it makes her hot spots itch.    Sulfa (Sulfonamide Antibiotics) Hives     Other reaction(s): HIVES  Other reaction(s): HIVES    Sulfasalazine      Other reaction(s): HIVES    Enbrel [Etanercept] Rash    Red Dye Hives       Patient Active Problem List   Diagnosis    Health care maintenance    Allergic rhinitis    Fibromyalgia    Carpal tunnel syndrome of right wrist    GERD (gastroesophageal reflux disease)    Chronic pain syndrome    Morbid obesity due to excess calories (CMS-HCC)    Psoriasis    IBS (irritable bowel syndrome)    History of tobacco use    Menopause    Psoriatic arthritis (CMS-HCC)    Cataracts, both eyes    Immunocompromised (CMS-HCC)    Seborrheic dermatitis    Furunculosis    Hidradenitis suppurativa    Rash    Keratosis pilaris    Painful and cold upper extremity    Painful and cold lower extremity    Abnormal finding of blood chemistry, unspecified    Low iron       Medication list has been reviewed and updated in Epic: Yes    Allergies have been reviewed and updated in Epic: Yes    Appropriateness of Therapy     Acute infections noted within Epic:  No active infections  Patient reported infection: None    Is the medication and dose appropriate based on diagnosis, medication list, comorbidities, allergies, medical history, patient???s ability to self-administer the medication, and therapeutic goals? Yes    Prescription has been clinically reviewed: Yes      Baseline Quality of Life Assessment      How many days over the past month did your PsA  keep you from your normal activities? For example, brushing your teeth or getting up in the morning. Affects her every day    Financial Information     Medication Assistance provided: Prior Authorization    Anticipated copay of $12.15 reviewed with patient. Verified delivery address.    Delivery Information     Scheduled delivery date: 2/26    Expected start date: 3/1      Medication will be delivered via Same Day Courier to the prescription address in [facility].  This shipment will not require a signature.      Explained the services we provide at [facility] and that each month  we would call to set up refills.  Stressed importance of returning phone calls so that we could ensure they receive their medications in time each month.  Informed patient that we should be setting up refills 7-10 days prior to when they will run out of medication.  A pharmacist will reach out to perform a clinical assessment periodically.  Informed patient that a welcome packet, containing information about our pharmacy and other support services, a Notice of Privacy Practices, and a drug information handout will be sent.      The patient or caregiver noted above participated in the development of this care plan and knows that they can request review of or adjustments to the care plan at any time.      Patient or caregiver verbalized understanding of the above information as well as how to contact the pharmacy at [phone] option 4 with any questions/concerns.  The pharmacy is open Monday through Friday 8:30am-4:30pm.  A pharmacist is available 24/7 via pager to answer any clinical questions they may have.    Patient Specific Needs     Does the patient have any physical, cognitive, or cultural barriers? No    Does the patient have adequate living arrangements? (i.e. the ability to store and take their medication appropriately) Yes    Did you identify any home environmental safety or security hazards? No    Patient prefers to have medications discussed with  Patient     Is the patient or caregiver able to read and understand education materials at a high school level or above? Yes    Patient's primary language is  English     Is the patient high risk? No    Does the patient have an additional or emergency contact listed in their chart? Yes    SOCIAL DETERMINANTS OF HEALTH     At the [facility], we have learned that life circumstances - like trouble affording food, housing, utilities, or transportation can affect the health of many of our patients.   That is why we wanted to ask: are you currently experiencing any life circumstances that are negatively impacting your health and/or quality of life? Patient declined to answer    Social Drivers of Health     Food Insecurity: No Food Insecurity (03/09/2023)    Hunger Vital Sign     Worried About Running Out of Food in the Last Year: Never true     Ran Out of Food in the Last Year: Never true   Internet Connectivity: Not on file   Housing/Utilities: Low Risk  (03/09/2023)    Housing/Utilities     Within the past 12 months, have you ever stayed: outside, in a car, in a tent, in an overnight shelter, or temporarily in someone else's home (i.e. couch-surfing)?: No     Are you worried about losing your housing?: No     Within the past 12 months, have you been unable to get utilities (heat, electricity) when it was really needed?: No   Tobacco Use: Medium Risk (01/25/2024)    Patient History     Smoking Tobacco Use: Former     Smokeless Tobacco Use: Never     Passive Exposure: Past   Transportation Needs: No Transportation Needs (03/09/2023)    PRAPARE - Therapist, art (Medical): No     Lack of Transportation (Non-Medical): No   Alcohol Use: Not At Risk (04/09/2022)    Alcohol Use  How often do you have a drink containing alcohol?: Never     How many drinks containing alcohol do you have on a typical day when you are drinking?: Not on file     How often do you have 5 or more drinks on one occasion?: Never   Interpersonal Safety: Not on file   Physical Activity: Not on file   Intimate Partner Violence: Not on file   Stress: Not on file   Substance Use: Low Risk  (03/09/2023)    Substance Use     In the past year, how often have you used prescription drugs for non-medical reasons?: Never     In the past year, how often have you used illegal drugs?: Never     In the past year, have you used any substance for non-medical reasons?: No   Social Connections: Not on file   Financial Resource Strain: Medium Risk (03/09/2023)    Overall Financial Resource Strain (CARDIA)     Difficulty of Paying Living Expenses: Somewhat hard   Depression: Not at risk (06/23/2022)    PHQ-2     PHQ-2 Score: 0   Health Literacy: Not on file       Would you be willing to receive help with any of the needs that you have identified today? Not applicable       Julianne Rice, PharmD  [facility] Specialty Pharmacist

## 2024-02-22 NOTE — Unmapped (Signed)
 Abstraction Result Flowsheet Data    This patient's last AWV date: [facility] Last Medicare Wellness Visit Date: Not Found  This patients last WCC/CPE date: : Not Found      Reason for Encounter  Reason for Encounter: Outreach  Primary Reason for Outreach: AWV  Text Message: No  MyChart Message: Yes  Outreach Call Outcome: Scheduled                                      Patient is scheduled for AWV on 3/25 at 3:20pm with Oletha Cruel, RN

## 2024-02-22 NOTE — Unmapped (Signed)
 Abstraction Result Flowsheet Data    This patient's last AWV date: [facility] Last Medicare Wellness Visit Date: Not Found  This patients last WCC/CPE date: : Not Found      Reason for Encounter  Reason for Encounter: Outreach  Primary Reason for Outreach: AWV  Text Message: No  MyChart Message: No  Outreach Call Outcome: Left Message

## 2024-03-02 NOTE — Unmapped (Addendum)
 Here is your personalized prevention plan based on your Annual Wellness Visit today.    Medicare Screening & Prevention Guidelines Recommendations Dates Completed HM Status and Next Due Follow-Up   Colorectal Cancer Screening Patients 45 to 75: stool cards annually OR colonoscopy every 10 years (or more frequently if high risk) OR FIT-DNA every 3 years.  Colonoscopy date: Not Found  FOBT/FIT date: Not Found  Sigmoidoscopy date: Not Found  FIT-DNA date: 01/20/2024 Health Maintenance Summary    This patient has no relevant Health Maintenance data.      Up to date   DEXA Bone Density Measurement Patients age 68-95 to have a Dexa every 5 years in postmenopausal women and high risk males. Males will defer to PCP. DEXA date: Not Found Health Maintenance Summary    This patient has no relevant Health Maintenance data.      Not within age range   Heart Disease Screening (fasting lipid panel) Minimum of every 5 years, patients age 76-75,  if no apparent signs or symptoms of heart disease. LDL date: Not Found  Total choleseterol date: 09/11/2020  HDL date: 09/11/2020  Triglycerides date: 09/11/2020 Health Maintenance Summary    -      Upcoming     Lipid Screening (Every 5 Years) Next due on 09/11/2025    09/11/2020  SmartData: [facility] TOTAL CHOLESTEROL AND HDL COMPLETE    09/11/2020  Lipid Panel    11/07/2014  Lipids, Fasting                   Up to date   Mammogram Screening Age 72-74 every 2 years.  Mammogram date: 05/13/2023 Health Maintenance Summary    -      Current Care Gaps     Mammogram (Yearly) Due soon on 05/12/2024    05/13/2023  Mammo Digital Diagnostic Tomo Bilateral    09/29/2022  Mammo Diagnostic Right    03/29/2022  Mammo Diagnostic Right    03/24/2022  Mammo TMIST Digital Screening Bilateral    03/02/2021  Mammo TMIST Digital Screening Bilateral Only the first 5   history entries have been loaded, but more history exists.                   Up to date   Pelvic Exam & Pap Smear Women ages 52 to 48 every 3 years with negative cytology (pap smear)  OR,   women ages 51 to 37 every 5 years if they have had both a negative pap and human papillomavirus (HPV) OR,  Every 3 years if they had a positive HPV result Pap Smear date: 07/03/2009  HPV date: Not Found Health Maintenance Summary    This patient has no relevant Health Maintenance data.    N/A    Hepatitis C Screening A one-time screening for HCV infection for adults age 51 to 57 years old. HCV screening date: 09/26/2015 Health Maintenance Summary    -      Completed or No Longer Recommended     Hepatitis C Screen  Completed    09/26/2015  Hepatitis C Ab component of Hep C Antibody                   Up to date   Covid-19 Vaccine For persons 5 and older @[id number] @ Health Maintenance   Topic Date Due    COVID-19 Vaccine (7 - Pfizer risk 2024-25 season) 03/12/2024      Up to date   Tdap Every 10 years (  will not be covered by Medicare). Check with your pharmacy to see if you are eligible for this vaccine based on your insurance. DTap/Tdap/TD vaccination: 09/12/2013 Health Maintenance Summary    -      Current Care Gaps     DTaP/Tdap/Td Vaccines (2 - Td or Tdap) Overdue since 09/13/2023    09/12/2013  Imm Admin: TdaP                 Provided information on how to obtain at pharmacy   Influenza Vaccine Annually  Influenza Vaccination: 12/07/2023   Health Maintenance Summary    -      Completed or No Longer Recommended     Influenza Vaccine (Series Information) Completed    12/07/2023  Imm Admin: INFLUENZA VACCINE IIV3(IM)(PF)6 MOS UP    12/08/2022  Imm Admin: Influenza Vaccine Quad(IM)6 MO-Adult(PF)    10/21/2021  Imm Admin: Influenza Vaccine Quad(IM)6 MO-Adult(PF)    10/08/2020  Imm Admin: Influenza Vaccine Quad(IM)6 MO-Adult(PF)    10/03/2019  Imm Admin: Influenza Vaccine Quad(IM)6 MO-Adult(PF)    Only the first 5 history entries have been loaded, but more history   exists.                   UP TO DATE    PneumococcalVaccine For people >=65, or with an underlying condition Pneumonia vaccination: 03/04/2016   Health Maintenance Summary    -      Current Care Gaps     Pneumococcal Vaccine 50+ (3 of 3 - PPSV23, PCV20 or PCV21) Overdue since   03/04/2021    03/04/2016  Imm Admin: PNEUMOCOCCAL POLYSACCHARIDE 23-VALENT    10/23/2015  Imm Admin: Pneumococcal Conjugate 13-Valent                   Provided information on how to obtain at pharmacy   RSV Vaccine Current recommendations to have shared decision making with your PCP, please talk with your provider. @RULEVALUE ([phone]) Health Maintenance Summary    This patient has no relevant Health Maintenance data.    Provided information on how to obtain at pharmacy   Zoster Vaccine Healthy adults 50 years and older receive 2 doses of recombinant zoster vaccine two to six months apart (may not be covered by Medicare). Check with your pharmacy to see if you are eligible for this vaccine based on your insurance. Zoster vaccination: Not Found   Health Maintenance Summary    -      Completed or No Longer Recommended     Zoster Vaccines  Discontinued    No completion history exists for this topic.                 PATIENT DECLINED   Diabetes Screening  Annually for patients 40-70, if risk factors (family hx of DM, hx of gestational DM, and/or PCOS), twice per year if diagnosed with pre-diabetes.    Range 65-99 Diabetes screening date: 09/13/2023 N/A Not applicable        Thank you for completing your Medicare Annual Wellness Visit today. If you have any questions about your Medicare Annual Wellness Visit please call our Care Manager, Avanell Shackleton RN CM at ( [phone]).  Please see your updated Personalized Prevention Plan which details the status of your preventative services, agreed upon goals to improve your fitness and health, and educational materials on health topics specific to you

## 2024-03-13 ENCOUNTER — Ambulatory Visit: Admit: 2024-03-13 | Discharge: 2024-03-14 | Payer: MEDICARE

## 2024-03-13 DIAGNOSIS — Z6835 Body mass index (BMI) 35.0-35.9, adult: Principal | ICD-10-CM

## 2024-03-13 DIAGNOSIS — E663 Overweight: Principal | ICD-10-CM

## 2024-03-13 DIAGNOSIS — Z0000 Encounter for general adult medical examination without abnormal findings: Principal | ICD-10-CM

## 2024-03-13 DIAGNOSIS — Z789 Other specified health status: Principal | ICD-10-CM

## 2024-03-13 NOTE — Unmapped (Cosign Needed)
 ADVANCE CARE PLANNING NOTE    Discussion Date:  March 13, 2024    Patient has decisional capacity:  Yes    Patient has selected a Health Care Decision-Maker if loses capacity: Yes    Health Care Decision Maker as of 03/13/2024    HCDM (patient stated preference): Riecke,Matthew - Son - [phone]    HCDM, First AlternateKevona, Lupinacci - Mother - [phone]    Discussion Participants: Patient and CM     Communication of Medical Status/Prognosis:  Patient states they are in ???Good??? health at this time.     Communication of Treatment Goals/Options:  Herrin Practical Form reviewed with patient, all questions answered. Form mailed to patient. Patient plans to review with family and discuss decisions. Patient will return completed form for scanning into Epic chart when complete and notarized     Treatment Decisions: 03/13/2024    Loran Senters, FNP was present and immediately available in office suite.    The patient reports interest in education materials about a Healthcare power of attorney Living Will. Materials provided at today's visit. .      Case Manager facilitated discussion with patient and  about advance care planning and documentation including Healthcare power of attorney Living Will . The patient voluntarily agreed to bring to office Healthcare power of attorney Living Will forms is completed, signed and notarized.  Note: forms that require notarization require two witnessess that are non-family members nor health care workers.               I spent 16-45 minutes providing voluntary advance care planning services for this patient.

## 2024-03-13 NOTE — Unmapped (Cosign Needed)
 Inform/Regulatory Low priority, no response needed unless change in care.     I am reaching out to [facility] as part of the embedded care management team regarding her Annual Medicare Wellness visit.  Patient is at risk for BMI Abnormal and/or patient would benefit from meeting with an RD for Chronic Condition Management, Dental issues, Falls Risk, Incontinence, and Physical Activity Limitations.    Patient advised to Review AVS for education related to risk.Mary Waters      Next appt with PCP: None      See chart for med list if needed    Sharing communication as part of regulatory requirements.        This auto-generated note displays some results identified during the AWV Assessments. For full results, please see the Flowsheet Links under the Additional Documentation section of this encounter in Chart Review.              Social Determinants of Health:  Social Determinants of Health Screened today.  Interventions Provided: I provided an intervention for the Physical Activity SDOH domain. The intervention was Provided [employer] health referral place for help with application for dental care     The following list of current providers and suppliers reviewed and updated this visit.  Patient Care Team:  Loran Senters, FNP as PCP - General (Family Medicine)  Loran Senters, FNP as PCP - Rande Brunt  [facility] (Ophthalmology)    Medications and supplements were reviewed and updated this visit. See medication list in encounter summary.     A personalized prevent plan was updated and reviewed with the patient. A copy has been provided to the patient in Patient Instructions.    Recent Hospitalizations reviewed:  No recent hospitalizations     General Health:  Patient answered (!) Fair (PATIENT STATES SHE WOULD LIKE TO DO MORE  FOR HERSELF. AND LOSE WEIGHT) to self health assessment  discussed setting goals related to self-health, wellness information provided in AVS, educated pt on self-care, and Blood Pressure Log Provided    Patient answered (!) No (NO INSURANCE  FOR DENTAL) to having visited the dentist in the past year.         Patient's BMI is 35.61 Patient answered (!) Yes (PATIENT WOULD LIKE TO SEE  RD) to nutrition services referral: CM referred patient to nutritionist, referral entered today and Provided information in AVS on: Heart healthy diet physical actvity        Pain identified during today's visit        03/13/24 1535   PainSc: 0-No pain           Safety:   The patient answered (!) Yes (PATIENT STATES FELL 2 MONTH AGO. PATIENT DECLINED NEED FOR CANE USE .) to falling in the last year, and (!) Yes (Patient declined need for PHYSICAL Therapy- pt states has PT before) to feeling unsteady while standing or walking.  Falls Assessment Review    Fall Risk Assessment was positive.  Falls risk interventions taken today were:  Medications were reviewed  Vision checked in the last year            Patient answered No to leaking stool or losing control of their bowels. Patient answered (!) Yes (imcomplete bladder empting issues) to leaking urine.           Psychosocial Assessment:       PHQ-2 Score: 0  PHQ-9 Score:      PHQ score  less than 5 indicating none to minimal depression.  Supervising physician or APP notified via routed chart.  No further action needed.     Screening complete, no depression identified / no further action needed today      Social Drivers of Health     Food Insecurity: No Food Insecurity (03/13/2024)    Hunger Vital Sign     Worried About Running Out of Food in the Last Year: Never true     Ran Out of Food in the Last Year: Never true   Tobacco Use: Medium Risk (03/13/2024)    Patient History     Smoking Tobacco Use: Former     Smokeless Tobacco Use: Never     Passive Exposure: Past   Transportation Needs: No Transportation Needs (03/13/2024)    PRAPARE - Therapist, art (Medical): No     Lack of Transportation (Non-Medical): No   Alcohol Use: Not At Risk (03/13/2024)    Alcohol Use     How often do you have a drink containing alcohol?: Never     How many drinks containing alcohol do you have on a typical day when you are drinking?: Not on file     How often do you have 5 or more drinks on one occasion?: Never   Housing: Low Risk  (03/13/2024)    Housing     Within the past 12 months, have you ever stayed: outside, in a car, in a tent, in an overnight shelter, or temporarily in someone else's home (i.e. couch-surfing)?: No     Are you worried about losing your housing?: No   Physical Activity: Insufficiently Active (03/13/2024)    Exercise Vital Sign     Days of Exercise per Week: 4 days     Minutes of Exercise per Session: 20 min   Utilities: Low Risk  (03/13/2024)    Utilities     Within the past 12 months, have you been unable to get utilities (heat, electricity) when it was really needed?: No   Stress: No Stress Concern Present (03/13/2024)    [employer] of Occupational Health - Occupational Stress Questionnaire     Feeling of Stress : Not at all   Interpersonal Safety: Not At Risk (03/13/2024)    Interpersonal Safety     Unsafe Where You Currently Live: No     Physically Hurt by Anyone: No     Abused by Anyone: No   Substance Use: Low Risk  (03/13/2024)    Substance Use     In the past year, how often have you used prescription drugs for non-medical reasons?: Never     In the past year, how often have you used illegal drugs?: Never     In the past year, have you used any substance for non-medical reasons?: No   Intimate Partner Violence: Not At Risk (03/13/2024)    Humiliation, Afraid, Rape, and Kick questionnaire     Fear of Current or Ex-Partner: No     Emotionally Abused: No     Physically Abused: No     Sexually Abused: No   Social Connections: Moderately Integrated (03/13/2024)    Social Connection and Isolation Panel [NHANES]     Frequency of Communication with Friends and Family: More than three times a week Frequency of Social Gatherings with Friends and Family: Once a week     Attends Religious Services: More than 4 times per year     Active Member  of Clubs or Organizations: Yes     Attends Banker Meetings: More than 4 times per year     Marital Status: Divorced   Programmer, applications: Low Risk  (03/13/2024)    Overall Financial Resource Strain (CARDIA)     Difficulty of Paying Living Expenses: Not hard at all   Depression: Not at risk (03/13/2024)    PHQ-2     PHQ-2 Score: 0   Internet Connectivity: No Internet connectivity concern identified (03/13/2024)    Internet Connectivity     Do you have access to internet services: Yes     How do you connect to the internet: Personal Device at home     Is your internet connection strong enough for you to watch video on your device without major problems?: Yes     Do you have enough data to get through the month?: Yes     Does at least one of the devices have a camera that you can use for video chat?: Yes   Health Literacy: Low Risk  (03/13/2024)    Health Literacy     : Never

## 2024-03-14 ENCOUNTER — Other Ambulatory Visit: Admit: 2024-03-14 | Discharge: 2024-03-15 | Payer: MEDICARE

## 2024-03-14 DIAGNOSIS — Z789 Other specified health status: Principal | ICD-10-CM

## 2024-03-14 NOTE — Unmapped (Signed)
 COMMUNITY HEALTH WORKER  Outreach Note    03/14/2024  Inform/Regulatory Low priority, no response needed unless change in care.     I am reaching out to [employer] as part of the community health worker team regarding her [employer].    Patient advised to  Spoke with patient states she is in need of a cleaning and check up patient has Medicare, Medicaid and also has financial assistance through [facility] patient patient was encouraged to reach out to financial assistance with [facility] to see if cleanings and checkup is covered with FA patient stated that she also will follow up on the her application to the dental school as well .      Sharing communication as part of regulatory requirements.       Referral:    Referral received from: Loran Senters   Reason for referral: Resource Coordination    Assessment:    Patients Primary Concern:  Dental  Patient Barriers:  Dental coverage  Patient Strengths: Self-advocacy    Additional Background Information provided by patient: No additional background given    Food Insecurity: No Food Insecurity (03/13/2024)    Hunger Vital Sign     Worried About Running Out of Food in the Last Year: Never true     Ran Out of Food in the Last Year: Never true     Transportation Needs: No Transportation Needs (03/13/2024)    PRAPARE - Transportation     Lack of Transportation (Medical): No     Lack of Transportation (Non-Medical): No     Financial Resource Strain: Low Risk  (03/13/2024)    Overall Financial Resource Strain (CARDIA)     Difficulty of Paying Living Expenses: Not hard at all     Housing: Low Risk  (03/13/2024)     Health Literacy: Low Risk  (03/13/2024)    Health Literacy     : Never      Social Connections: Moderately Integrated (03/13/2024)    Social Connection and Isolation Panel [NHANES]     Frequency of Communication with Friends and Family: More than three times a week     Frequency of Social Gatherings with Friends and Family: Once a week     Attends Religious Services: More than 4 times per year     Active Member of [employer] or Organizations: Yes     Attends Engineer, structural: More than 4 times per year     Marital Status: Divorced          I provided an intervention for the  Dental  SDOH domain. The intervention was Provided community resources     Patient expressed understanding.    Next Follow-up: No additional services needed    Note Routed: No.  Routed to:N/A    Signed: Maryjean Morn

## 2024-03-21 NOTE — Unmapped (Signed)
 Abstraction Result Flowsheet Data    This patient's last [facility] date: [facility] Last Medicare Wellness Visit Date: 03/13/2024  This patients last WCC/CPE date: : Not Found      Reason for Encounter  Reason for Encounter: Outreach  Primary Reason for Outreach: DM/HTN/BMI Follow-up  Text Message: No  MyChart Message: Yes  Outreach Call Outcome: Acupuncturist

## 2024-03-23 NOTE — Unmapped (Signed)
 Abstraction Result Flowsheet Data    This patient's last AWV date: [facility] Last Medicare Wellness Visit Date: 03/13/2024  This patients last WCC/CPE date: : Not Found      Reason for Encounter  Reason for Encounter: Outreach  Primary Reason for Outreach: DM/HTN/BMI Follow-up  Text Message: Yes  MyChart Message: No  Outreach Call Outcome: Message Sent Only

## 2024-03-27 NOTE — Unmapped (Signed)
 Outreached to patient to schedule Behavioral Health Visit.  Patient was unable to be contacted. Unable to leave message

## 2024-04-13 NOTE — Unmapped (Signed)
 Copied from CRM #[id number]. Topic: Referral - New Referral Request  >> Apr 13, 2024  3:51 PM Elgin Grit T wrote:  The patient  is requesting an order for: Radiology: Mammogram  They do not currently have a scheduled appointment.      Reason for Orders: Screening mammogram, yearlyl    Order(s):  Facility Name [facility]     Coverage: yes, coverage is accurate on file.    Preferred Contact: Cell Phone [phone]    Routine callback turnaround time: 24-48 business hours. Programmer, systems Notified)

## 2024-04-13 NOTE — Unmapped (Signed)
Order pended for pcp to review.

## 2024-04-13 NOTE — Unmapped (Signed)
 Addended by: Shamari Lofquist on: 04/13/2024 04:45 PM     Modules accepted: Orders

## 2024-04-25 ENCOUNTER — Ambulatory Visit: Admit: 2024-04-25 | Discharge: 2024-04-26 | Payer: MEDICARE

## 2024-04-25 DIAGNOSIS — M25531 Pain in right wrist: Principal | ICD-10-CM

## 2024-04-25 DIAGNOSIS — L4050 Arthropathic psoriasis, unspecified: Principal | ICD-10-CM

## 2024-04-25 DIAGNOSIS — Z79631 Methotrexate, long term, current use: Principal | ICD-10-CM

## 2024-04-25 LAB — CBC W/ AUTO DIFF
BASOPHILS ABSOLUTE COUNT: 0.1 10*9/L (ref 0.0–0.1)
BASOPHILS RELATIVE PERCENT: 1.4 %
EOSINOPHILS ABSOLUTE COUNT: 0.1 10*9/L (ref 0.0–0.5)
EOSINOPHILS RELATIVE PERCENT: 2 %
HEMATOCRIT: 36.5 % (ref 34.0–44.0)
HEMOGLOBIN: 11.9 g/dL (ref 11.3–14.9)
LYMPHOCYTES ABSOLUTE COUNT: 2 10*9/L (ref 1.1–3.6)
LYMPHOCYTES RELATIVE PERCENT: 43 %
MEAN CORPUSCULAR HEMOGLOBIN CONC: 32.6 g/dL (ref 32.0–36.0)
MEAN CORPUSCULAR HEMOGLOBIN: 29.3 pg (ref 25.9–32.4)
MEAN CORPUSCULAR VOLUME: 90 fL (ref 77.6–95.7)
MEAN PLATELET VOLUME: 7.1 fL (ref 6.8–10.7)
MONOCYTES ABSOLUTE COUNT: 0.4 10*9/L (ref 0.3–0.8)
MONOCYTES RELATIVE PERCENT: 8.5 %
NEUTROPHILS ABSOLUTE COUNT: 2.1 10*9/L (ref 1.8–7.8)
NEUTROPHILS RELATIVE PERCENT: 45.1 %
PLATELET COUNT: 287 10*9/L (ref 150–450)
RED BLOOD CELL COUNT: 4.06 10*12/L (ref 3.95–5.13)
RED CELL DISTRIBUTION WIDTH: 13.7 % (ref 12.2–15.2)
WBC ADJUSTED: 4.7 10*9/L (ref 3.6–11.2)

## 2024-04-25 LAB — ALT: ALT (SGPT): 8 U/L — ABNORMAL LOW (ref 10–49)

## 2024-04-25 LAB — CREATININE
CREATININE: 0.72 mg/dL (ref 0.55–1.02)
EGFR CKD-EPI (2021) FEMALE: >90 mL/min/{1.73_m2} (ref >=60)

## 2024-04-25 LAB — AST: AST (SGOT): 16 U/L (ref <=34)

## 2024-04-25 LAB — ALBUMIN: ALBUMIN: 3.7 g/dL (ref 3.4–5.0)

## 2024-04-25 MED ORDER — MELOXICAM 15 MG TABLET
ORAL_TABLET | Freq: Every day | ORAL | 1 refills | 90.00000 days | Status: CP
Start: 2024-04-25 — End: ?

## 2024-04-25 MED ORDER — LEUCOVORIN CALCIUM 5 MG TABLET
ORAL_TABLET | ORAL | 3 refills | 84.00000 days | Status: CP
Start: 2024-04-25 — End: ?
  Filled 2024-07-17: qty 48, 84d supply, fill #0
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
## 2024-04-26 MED FILL — RASUVO (PF) 15 MG/0.3 ML SUBCUTANEOUS AUTO-INJECTOR: SUBCUTANEOUS | 84 days supply | Qty: 3.6 | Fill #1

## 2024-05-25 MED ORDER — RISANKIZUMAB-RZAA 150 MG/ML SUBCUTANEOUS PEN INJECTOR
SUBCUTANEOUS | 3 refills | 0.00 days | Status: CP
Start: 2024-05-25 — End: ?
  Filled 2024-06-05: qty 1, 84d supply, fill #0

## 2024-05-28 NOTE — Unmapped (Signed)
 [facility] Clinical Assessment & Refill Coordination Note    Mary Waters, DOB: [birth year]  Phone: [phone] (home)     All above HIPAA information was verified with patient.     Was a Nurse, learning disability used for this call? No    Specialty Medication(s):   Inflammatory Disorders: Skyrizi     Current Medications[1]     Changes to medications: Lianni reports no changes at this time.    Medication list has been reviewed and updated in Epic: Yes    Allergies[2]    Changes to allergies: No    Allergies have been reviewed and updated in Epic: Yes    SPECIALTY MEDICATION ADHERENCE       Medication Adherence    Patient reported X missed doses in the last month: 0  Specialty Medication: Skyrizi  Patient is on additional specialty medications: No  Informant: patient          Specialty medication(s) dose(s) confirmed: Regimen is correct and unchanged.     Are there any concerns with adherence? No    Adherence counseling provided? Not needed    CLINICAL MANAGEMENT AND INTERVENTION      Clinical Benefit Assessment:    Do you feel the medicine is effective or helping your condition? Yes    Clinical Benefit counseling provided? Patient has seen some benefits already    Adverse Effects Assessment:    Are you experiencing any side effects? No    Are you experiencing difficulty administering your medicine? No    Quality of Life Assessment:    Quality of Life    Rheumatology  1. What impact has your specialty medication had on the reduction of your daily pain level?: Some  2. What impact has your specialty medication had on your ability to complete daily tasks (prepare meals, get dressed, etc...)?: Some  Oncology  Dermatology  Cystic Fibrosis          How many days over the past month did your PsA  keep you from your normal activities? For example, brushing your teeth or getting up in the morning. Feels it every day but does not stop her from doing this     Have you discussed this with your provider? Not needed    Acute Infection Status:    Acute infections noted within Epic:  No active infections    Patient reported infection: None    Therapy Appropriateness:    Is therapy appropriate based on current medication list, adverse reactions, adherence, clinical benefit and progress toward achieving therapeutic goals? Yes, therapy is appropriate and should be continued .  Maybe 3 our 7 days are the worse    Clinical Intervention:    Was an intervention completed as part of this clinical assessment? No    DISEASE/MEDICATION-SPECIFIC INFORMATION      For patients on injectable medications: Patient currently has 0 doses left.  Next injection is scheduled for 6/22.    Chronic Inflammatory Diseases: Have you experienced any flares in the last month? No    PATIENT SPECIFIC NEEDS     Does the patient have any physical, cognitive, or cultural barriers? No    Is the patient high risk? No    Does the patient require physician intervention or other additional services (i.e., nutrition, smoking cessation, social work)? No    Does the patient have an additional or emergency contact listed in their chart? Yes    SOCIAL DETERMINANTS OF HEALTH  At the [facility], we have learned that life circumstances - like trouble affording food, housing, utilities, or transportation can affect the health of many of our patients.   That is why we wanted to ask: are you currently experiencing any life circumstances that are negatively impacting your health and/or quality of life? Patient declined to answer    Social Drivers of Health     Food Insecurity: No Food Insecurity (03/13/2024)    Hunger Vital Sign     Worried About Running Out of Food in the Last Year: Never true     Ran Out of Food in the Last Year: Never true   Tobacco Use: Medium Risk (04/25/2024)    Patient History     Smoking Tobacco Use: Former     Smokeless Tobacco Use: Never     Passive Exposure: Past   Transportation Needs: No Transportation Needs (03/13/2024)    PRAPARE - Therapist, art (Medical): No     Lack of Transportation (Non-Medical): No   Alcohol Use: Not At Risk (03/13/2024)    Alcohol Use     How often do you have a drink containing alcohol?: Never     How many drinks containing alcohol do you have on a typical day when you are drinking?: Not on file     How often do you have 5 or more drinks on one occasion?: Never   Housing: Low Risk  (03/13/2024)    Housing     Within the past 12 months, have you ever stayed: outside, in a car, in a tent, in an overnight shelter, or temporarily in someone else's home (i.e. couch-surfing)?: No     Are you worried about losing your housing?: No   Physical Activity: Insufficiently Active (03/13/2024)    Exercise Vital Sign     Days of Exercise per Week: 4 days     Minutes of Exercise per Session: 20 min   Utilities: Low Risk  (03/13/2024)    Utilities     Within the past 12 months, have you been unable to get utilities (heat, electricity) when it was really needed?: No   Stress: No Stress Concern Present (03/13/2024)    [employer] of Occupational Health - Occupational Stress Questionnaire     Feeling of Stress : Not at all   Interpersonal Safety: Not At Risk (04/25/2024)    Interpersonal Safety     Unsafe Where You Currently Live: No     Physically Hurt by Anyone: No     Abused by Anyone: No   Substance Use: Low Risk  (03/13/2024)    Substance Use     In the past year, how often have you used prescription drugs for non-medical reasons?: Never     In the past year, how often have you used illegal drugs?: Never     In the past year, have you used any substance for non-medical reasons?: No   Intimate Partner Violence: Not At Risk (03/13/2024)    Humiliation, Afraid, Rape, and Kick questionnaire     Fear of Current or Ex-Partner: No     Emotionally Abused: No     Physically Abused: No     Sexually Abused: No   Social Connections: Moderately Integrated (03/13/2024)    Social Connection and Isolation Panel     Frequency of Communication with Friends and Family: More than three times a week     Frequency of Social Gatherings with Friends and  Family: Once a week     Attends Religious Services: More than 4 times per year     Active Member of Clubs or Organizations: Yes     Attends Engineer, structural: More than 4 times per year     Marital Status: Divorced   Physicist, medical Strain: Low Risk  (03/13/2024)    Overall Financial Resource Strain (CARDIA)     Difficulty of Paying Living Expenses: Not hard at all   Health Literacy: Low Risk  (03/13/2024)    Health Literacy     : Never   Internet Connectivity: No Internet connectivity concern identified (03/13/2024)    Internet Connectivity     Do you have access to internet services: Yes     How do you connect to the internet: Personal Device at home     Is your internet connection strong enough for you to watch video on your device without major problems?: Yes     Do you have enough data to get through the month?: Yes     Does at least one of the devices have a camera that you can use for video chat?: Yes       Would you be willing to receive help with any of the needs that you have identified today? Not applicable       SHIPPING     Specialty Medication(s) to be Shipped:   Inflammatory Disorders: Skyrizi    Other medication(s) to be shipped: No additional medications requested for fill at this time     Changes to insurance: No    Cost and Payment: Patient has a copay of $12.15. They are aware and have authorized the pharmacy to charge the credit card on file.    Delivery Scheduled: Yes, Expected medication delivery date: 6/17.     Medication will be delivered via Same Day Courier to the confirmed prescription address in [facility].    The patient will receive a drug information handout for each medication shipped and additional FDA Medication Guides as required.  Verified that patient has previously received a Conservation officer, historic buildings and a Surveyor, mining.    The patient or caregiver noted above participated in the development of this care plan and knows that they can request review of or adjustments to the care plan at any time.      All of the patient's questions and concerns have been addressed.    Sherle Dire, PharmD   [facility] Specialty Pharmacist         [1]   Current Outpatient Medications   Medication Sig Dispense Refill    acetaminophen (TYLENOL) 500 MG tablet Take 2 tablets (1,000 mg total) by mouth every four (4) hours as needed.      clindamycin (CLEOCIN T) 1 % lotion Apply topically to affected bumps two (2) times a  until resolved. 60 mL 5    escitalopram oxalate (LEXAPRO) 10 MG tablet Take 1 tablet (10 mg total) by mouth daily. 90 tablet 3    ketoconazole (NIZORAL) 2 % shampoo Apply to scalp for 5-10 minutes, then wash off. Can use 1-2 times a week. 120 mL 10    leuCOVorin (WELLCOVORIN) 5 mg tablet Take 4 tablets (20 mg total) by mouth every seven (7) days. Take 12-24 hours after methotrexate. 48 tablet 3    meloxicam (MOBIC) 15 MG tablet Take 1 tablet (15 mg total) by mouth daily. 90 tablet 1    methotrexate,  PF, (RASUVO, PF,) 15 mg/0.3 mL AtIn Inject 15 mg under the skin every seven (7) days. 1.2 mL 11    risankizumab-rzaa (SKYRIZI) 150 mg/mL PnIj Inject 150 mg under the skin Every three (3) months. Maintenance regimen to start after loading dose. Maintenance regimen is every 12 weeks (3 months). 1 mL 3     No current facility-administered medications for this visit.   [2]   Allergies  Allergen Reactions    Prednisone Hives     03/09/2023 States she can take Prednisone but it makes her hot spots itch.    Sulfa (Sulfonamide Antibiotics) Hives     Other reaction(s): HIVES  Other reaction(s): HIVES    Sulfasalazine      Other reaction(s): HIVES    Enbrel [Etanercept] Rash    Red Dye Hives

## 2024-06-06 DIAGNOSIS — R928 Other abnormal and inconclusive findings on diagnostic imaging of breast: Principal | ICD-10-CM

## 2024-06-06 DIAGNOSIS — Z1231 Encounter for screening mammogram for malignant neoplasm of breast: Principal | ICD-10-CM

## 2024-07-16 ENCOUNTER — Inpatient Hospital Stay: Admit: 2024-07-16 | Discharge: 2024-07-16 | Payer: MEDICARE

## 2024-07-17 MED FILL — RASUVO (PF) 15 MG/0.3 ML SUBCUTANEOUS AUTO-INJECTOR: SUBCUTANEOUS | 84 days supply | Qty: 3.6 | Fill #2

## 2024-08-08 ENCOUNTER — Ambulatory Visit: Admit: 2024-08-08 | Discharge: 2024-08-09 | Payer: MEDICARE

## 2024-08-08 ENCOUNTER — Encounter: Admit: 2024-08-08 | Discharge: 2024-08-09 | Payer: MEDICARE

## 2024-08-08 DIAGNOSIS — R700 Elevated erythrocyte sedimentation rate: Principal | ICD-10-CM

## 2024-08-08 DIAGNOSIS — M4802 Spinal stenosis, cervical region: Principal | ICD-10-CM

## 2024-08-08 DIAGNOSIS — Z79899 Other long term (current) drug therapy: Principal | ICD-10-CM

## 2024-08-08 DIAGNOSIS — Z780 Asymptomatic menopausal state: Principal | ICD-10-CM

## 2024-08-08 DIAGNOSIS — L4050 Arthropathic psoriasis, unspecified: Principal | ICD-10-CM

## 2024-08-08 LAB — CBC W/ AUTO DIFF
BASOPHILS ABSOLUTE COUNT: 0.1 10*9/L (ref 0.0–0.1)
BASOPHILS RELATIVE PERCENT: 1 %
EOSINOPHILS ABSOLUTE COUNT: 0.1 10*9/L (ref 0.0–0.5)
EOSINOPHILS RELATIVE PERCENT: 1.9 %
HEMATOCRIT: 37 % (ref 34.0–44.0)
HEMOGLOBIN: 12.3 g/dL (ref 11.3–14.9)
LYMPHOCYTES ABSOLUTE COUNT: 2.5 10*9/L (ref 1.1–3.6)
LYMPHOCYTES RELATIVE PERCENT: 41.2 %
MEAN CORPUSCULAR HEMOGLOBIN CONC: 33.2 g/dL (ref 32.0–36.0)
MEAN CORPUSCULAR HEMOGLOBIN: 29.1 pg (ref 25.9–32.4)
MEAN CORPUSCULAR VOLUME: 87.8 fL (ref 77.6–95.7)
MEAN PLATELET VOLUME: 7.1 fL (ref 6.8–10.7)
MONOCYTES ABSOLUTE COUNT: 0.5 10*9/L (ref 0.3–0.8)
MONOCYTES RELATIVE PERCENT: 8 %
NEUTROPHILS ABSOLUTE COUNT: 2.9 10*9/L (ref 1.8–7.8)
NEUTROPHILS RELATIVE PERCENT: 47.9 %
PLATELET COUNT: 286 10*9/L (ref 150–450)
RED BLOOD CELL COUNT: 4.21 10*12/L (ref 3.95–5.13)
RED CELL DISTRIBUTION WIDTH: 13.9 % (ref 12.2–15.2)
WBC ADJUSTED: 6.2 10*9/L (ref 3.6–11.2)

## 2024-08-08 LAB — ALT: ALT (SGPT): 9 U/L — ABNORMAL LOW (ref 10–49)

## 2024-08-08 LAB — CREATININE
CREATININE: 0.8 mg/dL (ref 0.55–1.02)
EGFR CKD-EPI (2021) FEMALE: 86 mL/min/1.73m2 (ref >=60)

## 2024-08-08 LAB — SEDIMENTATION RATE: ERYTHROCYTE SEDIMENTATION RATE: 117 mm/h — ABNORMAL HIGH (ref 0–30)

## 2024-08-08 LAB — C-REACTIVE PROTEIN: C-REACTIVE PROTEIN: 28.1 mg/L — ABNORMAL HIGH (ref <=10.0)

## 2024-08-08 LAB — AST: AST (SGOT): 16 U/L (ref <=34)

## 2024-08-08 MED ORDER — LEUCOVORIN CALCIUM 5 MG TABLET
ORAL_TABLET | ORAL | 3 refills | 84.00000 days | Status: CP
Start: 2024-08-08 — End: ?
  Filled 2024-10-17: qty 36, 84d supply, fill #0

## 2024-08-08 NOTE — Unmapped (Signed)
 Patient Name: Mary Waters  PCP: ??Geralene Levorn Geralds, FNP  Source of History: patient and records  Date of Visit: 08/08/24 1:50 PM    Chief Compliant: follow-up for PsA    PRIOR RHEUMATOLOGIC HISTORY:?? History of psoriatic arthritis.  Established care in September 2016.  Ultrasound in 2016 showing small PIP joint effusions, presence of enthesophytes, and achilles tendon inflammation/tendinitis consistent with an underlying spondyloarthropathy such as psoriatic arthritis.    Treatment history:  - Humira 2016-06/2016 with loss of efficacy  - Methotrexate  added 01/2016 for persistent peripheral arthritis.   - Enbrel 06/2016-09/2017 with loss of efficacy  - Cimzia  09/2017-07/2019, in office injections.   - Cosentyx  started 07/2019, mtx switched to SQ dosing 07/2019  - US  hands 12/2018 showing partially treated psoriatic arthritis with continued tendosynovitis. Pt felt improvement in hand pain after this US  so no medication changes were made.  - US  hands 02/2023 showing tendonitis extensor tendon R wrist and 3rd finger. Recommend switch from Cosentyx  to Tremfya  100 mg q0, q4 and every 8 weeks thereafter  - Tremfya  self discontinued in 10/2023 due to difficulty obtaining from pharmacy and itching with use. Changed to skyrizi  in 01/2024. Rasuvo  decreased to 15 mg at that time due to nausea      Additional hx of cervical myelopathy for which surgery has been recommended, but she is not planning to pursue this. Has numbness and paresthesias in both hands.  She also has clinical evidence of fibromyalgia.      Current med regimen: skyrizi  150 mg q 12 wks, rasuvo  15 mg qwk, leucovorin  15 mg q wk.     HPI: Mary Waters is a 58 y.o. female who presents for her follow-up for psoriatic arthritis with known cervical myelopathy. Last evaluated by me in February 2025 and adjusted regimen by switching from guselkumab  to risankizumab  and lowering rasuvo  due to GI side effects. Evaluated by [facility] in May 2025 and reported improved side effect profile and responsiveness to regimen. Here today for follow-up in person.     Today, she reports feeling okay. She reports not a lot of pain but there is stiffness. Takes her about 20-30 minutes to loosen up after she gets up in the morning or after sitting for long periods. She has completed the Skyrizi  load is now on maintenance every 3 months. Her last injection was in June. She feels much improved on the lower dose of Rasuvo . Less fatigue and nausea. She feels satisfied with regimen. Has stable numbness and paresthesias in her arms and feet. She notes sometimes sensation of a thread moving across her skin that is new. No psoriasis. No hidradenitis. No eye redness or pain. Vision is declining. She has self lowered the leucovorin  15 mg once a week and founds this works for her. She has noticed once in awhile she runs into things or loses balance and needs help getting up if she falls down. She is reluctant to pursue surgery. She did find benefit with physical therapy.     ROS??: Attests to the above, otherwise, review of all other systems is negative  ????  Past Medical and Surgical History:  ??  Patient Active Problem List    Diagnosis Date Noted    Painful and cold upper extremity 04/09/2022    Painful and cold lower extremity 04/09/2022    Abnormal finding of blood chemistry, unspecified 04/09/2022    Low iron 04/09/2022    Seborrheic dermatitis 12/07/2021    Furunculosis 12/07/2021  Hidradenitis suppurativa 12/07/2021    Rash 12/07/2021    Keratosis pilaris 12/07/2021    Immunocompromised (HHS-HCC) 07/22/2021    Psoriatic arthritis     01/07/2016    Menopause 08/07/2015    History of tobacco use 10/23/2014    Fibromyalgia 04/01/2014    Carpal tunnel syndrome of right wrist 04/01/2014    GERD (gastroesophageal reflux disease) 04/01/2014    Chronic pain syndrome 04/01/2014    Morbid obesity due to excess calories (CMS-HCC) 04/01/2014    Psoriasis 04/01/2014    Allergic rhinitis 02/04/2014    Cataracts, both eyes 10/03/2013    Health care maintenance 12/23/2010    IBS (irritable bowel syndrome) 12/20/2008     Past Surgical History:   Procedure Laterality Date    CESAREAN SECTION      DILATION AND CURETTAGE OF UTERUS      HYSTERECTOMY  2001    TAH    KNEE SURGERY      TOTAL ABDOMINAL HYSTERECTOMY       Allergies:   ??  Allergies   Allergen Reactions    Prednisone  Hives     03/09/2023 States she can take Prednisone  but it makes her hot spots itch.    Sulfa (Sulfonamide Antibiotics) Hives     Other reaction(s): HIVES  Other reaction(s): HIVES    Sulfasalazine      Other reaction(s): HIVES    Enbrel [Etanercept] Rash    Red Dye Hives     Current Outpatient Medications:  ??  Current Outpatient Medications on File Prior to Visit   Medication Sig Dispense Refill    acetaminophen  (TYLENOL ) 500 MG tablet Take 2 tablets (1,000 mg total) by mouth every four (4) hours as needed.      clindamycin  (CLEOCIN  T) 1 % lotion Apply topically to affected bumps two (2) times a  until resolved. 60 mL 5    escitalopram  oxalate (LEXAPRO ) 10 MG tablet Take 1 tablet (10 mg total) by mouth daily. 90 tablet 3    ketoconazole  (NIZORAL ) 2 % shampoo Apply to scalp for 5-10 minutes, then wash off. Can use 1-2 times a week. 120 mL 10    leuCOVorin  (WELLCOVORIN ) 5 mg tablet Take 4 tablets (20 mg total) by mouth every seven (7) days. Take 12-24 hours after methotrexate . 48 tablet 3    meloxicam  (MOBIC ) 15 MG tablet Take 1 tablet (15 mg total) by mouth daily. 90 tablet 1    methotrexate , PF, (RASUVO , PF,) 15 mg/0.3 mL AtIn Inject 15 mg under the skin every seven (7) days. 1.2 mL 11    risankizumab -rzaa (SKYRIZI ) 150 mg/mL PnIj Inject 150 mg under the skin Every three (3) months. Maintenance regimen to start after loading dose. Maintenance regimen is every 12 weeks (3 months). 1 mL 3     No current facility-administered medications on file prior to visit.   ??  ??  Immunization History   Administered Date(s) Administered COVID-19 VAC,BIVALENT(22YR UP),PFIZER 02/24/2022    COVID-19 VACC,MRNA,(PFIZER)(PF) 03/02/2020, 03/22/2020, 09/16/2020    COVID-19 VACCINE,MRNA(MODERNA)(PF) 07/22/2021    Covid-19 Vac, (91yr+) (Comirnaty) Mrna Pfizer  09/13/2023    INFLUENZA VACCINE IIV3(IM)(PF)6 MOS UP 12/07/2023    Influenza Vaccine Quad(IM)6 MO-Adult(PF) 09/26/2015, 10/14/2016, 12/02/2017, 11/07/2018, 10/03/2019, 10/08/2020, 10/21/2021, 12/08/2022    PNEUMOCOCCAL POLYSACCHARIDE 23-VALENT 03/04/2016    PPD Test 09/12/2013    Pneumococcal Conjugate 13-Valent 10/23/2015    Rubella 01/10/1979    TdaP 09/12/2013     ??  PHYSICAL EXAM?  Vital signs: Temp 36.1 ??C (97 ??  F) (Temporal)  - Wt (!) 102.1 kg (225 lb)  - BMI 34.21 kg/m?? Body mass index is 34.21 kg/m??. BP 118/75  Gen: Well-developed, well-nourished adult in no apparent distress. Normocephalic with no external signs of trauma. Pleasant and cooperative. AOx4.?  HEENT: PERRLA, EOMI, mask in place  Lungs: Broad chest excursion with good air movement. CTAB without wheezing/rhonchi/rales. ??  CV: RRR, Normal S1/S2, No murmurs/rubs/gallops heard.  PV: Warm, 2+ radial and pedal pulses, no C/C/E.??  Neuro: Good comprehension/cognition. CN 2-12 intact.  Muscle strength 5/5 in all extremities. Gait normal.?? Reports numbness at fingertips.  Comprehensive Musculoskeletal Examination:??  Jaw, neck without limited ROM.??  Shoulders, elbows, wrists, hands, fingers:  Unable to make tight fist bilaterally but grip strength intact that is stable. No MCP, PIP or DIP swelling or tenderness. Notes discomfort at right 5th volar aspect where there is a nodule. Notes occasional triggering of other digits. Wrists, Elbows and shoulders wnl  Bilateral hips with good ROM.  Modified schober 15 cm to 19 cm. Restricted by 1 cm. Stable  Knee, ankles, feet, toes: Knee stable to valgus/varus stress and anterior/posterior drawer sign.?? Ankles with full ROm. NO MTP tenderness with palpation but noted discomfort with right MTP squeeze.   Skin: No rashes    LABORATORY-persistently elevated inflammatory markers of unclear significance. SPEP with IFX and Free light chain ordered. No signs for medication toxicity  Recent Results (from the past week)   Creatinine    Collection Time: 08/08/24  2:51 PM   Result Value Ref Range    Creatinine 0.80 0.55 - 1.02 mg/dL    eGFR CKD-EPI (7978) Female 86 >=60 mL/min/1.41m2   AST    Collection Time: 08/08/24  2:51 PM   Result Value Ref Range    AST 16 <=34 U/L   ALT    Collection Time: 08/08/24  2:51 PM   Result Value Ref Range    ALT 9 (L) 10 - 49 U/L   CRP  C-Reactive Protein    Collection Time: 08/08/24  2:51 PM   Result Value Ref Range    CRP 28.1 (H) <=10.0 mg/L   ESR Sed rate    Collection Time: 08/08/24  2:51 PM   Result Value Ref Range    Sed Rate 117 (H) 0 - 30 mm/h   CBC w/ Differential    Collection Time: 08/08/24  2:51 PM   Result Value Ref Range    WBC 6.2 3.6 - 11.2 10*9/L    RBC 4.21 3.95 - 5.13 10*12/L    HGB 12.3 11.3 - 14.9 g/dL    HCT 62.9 65.9 - 55.9 %    MCV 87.8 77.6 - 95.7 fL    MCH 29.1 25.9 - 32.4 pg    MCHC 33.2 32.0 - 36.0 g/dL    RDW 86.0 87.7 - 84.7 %    MPV 7.1 6.8 - 10.7 fL    Platelet 286 150 - 450 10*9/L    Neutrophils % 47.9 %    Lymphocytes % 41.2 %    Monocytes % 8.0 %    Eosinophils % 1.9 %    Basophils % 1.0 %    Absolute Neutrophils 2.9 1.8 - 7.8 10*9/L    Absolute Lymphocytes 2.5 1.1 - 3.6 10*9/L    Absolute Monocytes 0.5 0.3 - 0.8 10*9/L    Absolute Eosinophils 0.1 0.0 - 0.5 10*9/L    Absolute Basophils 0.1 0.0 - 0.1 10*9/L     ??IMAGING?? - no recent    ??  GENERAL SUMMARY AND IMPRESSION: ??  ??  In summary, the patient is a 58 y.o. female here for follow-up for psoriatic arthritis. She reports compliance with risankizumab , Rasuvo  15 mg once a week and leucovorin  15 mg once a week. She appears well controlled with improved side effect profile on current regimen. Monitoring labs completed at visit without toxicity. Has persistently elevated inflammatory markers not reflective on clinical exam of her joints. Recheck SPEP with IFX and Serum light chain. Recent HgbA1c have not been elevated. Unclear contributor but will continue to monitor. Follow-up in 3 months with [facility] and myself in 6 months.  Return in about 6 months (around 02/08/2025).    RECOMMENDATIONS: ??  ????   Diagnosis ICD-10-CM Associated Orders   1. Psoriatic arthritis      L40.50 Ambulatory referral to Physical Therapy     CBC w/ Differential     Creatinine     AST     ALT     Dexa Bone Density Skeletal     CRP  C-Reactive Protein     ESR Sed rate     Serum Protein Electrophoresis and Immunofixation     Serum Free Light Chains     leuCOVorin  (WELLCOVORIN ) 5 mg tablet      2. Cervical spinal stenosis  M48.02 Ambulatory referral to Physical Therapy      3. High risk medication use  Z79.899 CBC w/ Differential     Creatinine     AST     ALT      4. Menopause  Z78.0 Dexa Bone Density Skeletal      5. Elevated sed rate  R70.0 CRP  C-Reactive Protein     ESR Sed rate     Serum Protein Electrophoresis and Immunofixation     Serum Free Light Chains          Patient Instructions   Referral entered to physical therapy at [facility] for bone density. Radiology will call you to schedule and offer different sites to complete the study.    No change in medication    Lab monitoring today    The patient indicates understanding of these issues and agrees to the plan as outlined above.  Contact information provided for any concerns or questions in the interim.    I personally spent 39 minutes face-to-face and non-face-to-face in the care of this patient, which includes all pre, intra, and post visit time on the date of service.  All documented time was specific to the E/M visit and does not include any procedures that may have been performed.          Takahiro Godinho C. Shary, MD, PhD  Assistant Professor of Medicine  Department of Medicine/Division of Rheumatology  [facility]  3:56 PM

## 2024-08-08 NOTE — Unmapped (Addendum)
 Referral entered to physical therapy at [facility] for bone density. Radiology will call you to schedule and offer different sites to complete the study.    No change in medication    Lab monitoring today

## 2024-08-10 LAB — SERUM PROTEIN ELECTROPHORESIS AND IMMUNOFIXATION
ALBUMIN (SPE): 4 g/dL (ref 3.5–5.0)
ALPHA-1 GLOBULIN: 0.4 g/dL (ref 0.2–0.5)
ALPHA-2 GLOBULIN: 1 g/dL (ref 0.5–1.1)
BETA-1 GLOBULIN: 0.6 g/dL (ref 0.3–0.6)
BETA-2 GLOBULIN: 0.6 g/dL (ref 0.2–0.6)
GAMMAGLOBULIN: 1.3 g/dL (ref 0.5–1.5)
PROTEIN TOTAL (SPECIAL CHEM): 7.8 g/dL

## 2024-08-10 LAB — SERUM FREE LIGHT CHAINS
K/L FLC RATIO: 1.54 (ref 0.26–1.65)
KAPPA FREE,SERUM: 2.99 mg/dL — ABNORMAL HIGH (ref 0.33–1.94)
LAMBDA FREE, SER: 1.94 mg/dL (ref 0.57–2.63)

## 2024-08-24 NOTE — Unmapped (Signed)
 [facility] Refill Coordination Note    Specialty Medication(s) to be Shipped:   Inflammatory Disorders: Skyrizi     Other medication(s) to be shipped: No additional medications requested for fill at this time    Specialty Medications not needed at this time: N/A     Mary Waters, DOB: 02/05/1966  Phone: [phone] (home)       All above HIPAA information was verified with patient.     Was a Nurse, learning disability used for this call? No    Completed refill call assessment today to schedule patient's medication shipment from the [facility]  [phone]).  All relevant notes have been reviewed.     Specialty medication(s) and dose(s) confirmed: Regimen is correct and unchanged.   Changes to medications: Mary Waters reports no changes at this time.  Changes to insurance: No  New side effects reported not previously addressed with a pharmacist or physician: None reported  Questions for the pharmacist: No    Confirmed patient received a Conservation officer, historic buildings and a Surveyor, mining with first shipment. The patient will receive a drug information handout for each medication shipped and additional FDA Medication Guides as required.       DISEASE/MEDICATION-SPECIFIC INFORMATION        For patients on injectable medications: Next injection is scheduled for 9.28.25.    SPECIALTY MEDICATION ADHERENCE     Medication Adherence    Patient reported X missed doses in the last month: 0  Specialty Medication: SKYRIZI  150 mg/mL Pnij (risankizumab -rzaa)  Patient is on additional specialty medications: No              Were doses missed due to medication being on hold? No      SKYRIZI  150 mg/mL Pnij (risankizumab -rzaa): 0 doses of medicine on hand       REFERRAL TO PHARMACIST     Referral to the pharmacist: Not needed      SHIPPING     Shipping address confirmed in Epic.     Cost and Payment: Patient has a $0 copay, payment information is not required.    Delivery Scheduled: Yes, Expected medication delivery date: 9.18.25.     Medication will be delivered via Same Day Courier to the prescription address in Epic WAM.    Mary Waters   [facility]  Specialty Technician

## 2024-09-05 ENCOUNTER — Inpatient Hospital Stay: Admit: 2024-09-05 | Discharge: 2024-09-05 | Payer: MEDICARE

## 2024-09-06 MED FILL — SKYRIZI 150 MG/ML SUBCUTANEOUS PEN INJECTOR: SUBCUTANEOUS | 84 days supply | Qty: 1 | Fill #1

## 2024-10-10 ENCOUNTER — Other Ambulatory Visit: Payer: Self-pay | Admitting: Medical Genetics

## 2024-10-17 MED FILL — RASUVO (PF) 15 MG/0.3 ML SUBCUTANEOUS AUTO-INJECTOR: SUBCUTANEOUS | 84 days supply | Qty: 3.6 | Fill #3

## 2024-10-17 MED FILL — MELOXICAM 15 MG TABLET: ORAL | 90 days supply | Qty: 90 | Fill #0

## 2024-10-18 ENCOUNTER — Other Ambulatory Visit
Admission: RE | Admit: 2024-10-18 | Discharge: 2024-10-18 | Disposition: A | Discharge status: Home / Self Care | Source: Ambulatory Visit | Attending: Medical Genetics | Admitting: Medical Genetics

## 2024-10-21 NOTE — Progress Notes (Signed)
 Dermatology Note     Assessment and Plan:      Psoriasis with scalp and palmoplantar involvement and psoriatic arthritis, stable chronic illness:  Hair breakage of vertex scalp without evident scalp pathology  On methotrexate  with Leucovorin  and now Skyrizi  via her rheumatologist  -Previous treatments: topical corticosteroids, Cosentyx , Tremfya   -Continue clobetasol  0.05% ointment BID for active rash on skin, stop when smooth.  -Discussed that area of shorter hair on vertex scalp does not demonstrate areas of scarring, active inflammation, scaling. Suspect that patient's pruritus and subsequent itching are likely leading to hair breakage (hairs in different lengths on background of underlying normal, healthy scalp). We will plan to manage symptom of pruritus with goal of allowing for normal hair regrowth. Encouraged patient to establish with a hairdresser knowledgeable in her hair texture.   -Start fluocinolone (Dermasmoothe) oil BID for scalp pruritus as needed; can leave in.   -Diagnosis, treatment options, prognosis, risk/ benefit, and side effects were discussed with the patient     Onycholysis  Nail changes in setting of psoriasis:  - Counseled that onycholysis is related to nail lifting off of the nail bed, which can be promoted by frequent wet/dry hands.   - Encouraged patient to continue applying Vaseline to the distal nail plate and bed  - Recommend minimizing the amount of time hands and nails are wet  - Patient gets regular manicures and pedicures    The patient was advised to call for an appointment should any new, changing, or symptomatic lesions develop.     RTC: Return in about 1 year (around 10/22/2025). or sooner as needed   _________________________________________________________________      Chief Complaint     Chief Complaint   Patient presents with    hairloss     Pt stated hair continues to thin and break out.        HPI     Mary Waters is a 59 y.o. female who presents as a returning patient (last seen 05/02/2023) to Dermatology for follow up of psoriasis and new concern for hair loss.     The patient continues on methotrexate  15 mg weekly, leucovorin , and has now started Skyrizi  150 mg q3months for her psoriasis and psoriatic arthritis. Her joint symptoms are well managed and she has not had recent skin flares. She does report a patch of scalp itching on the right vertex scalp for several months; no notable flaking, but feels that her hair is breaking off in this area. She denies involvement of the rest of her scalp besides this focal area. She gets regular manicures and pedicures and feels that her psoriatic nail changes are improving. She is tolerating her systemic medications well.     The patient denies any other new or changing lesions or areas of concern.     Pertinent Past Medical History     No history of skin cancer    Problem List          Musculoskeletal and Integument    Psoriasis - Primary    Relevant Medications    fluocinolone (DERMA-SMOOTHE/FS SCALP OIL) 0.01 % external oil       Past Medical History, Family History, Social History, Medication List, Allergies, and Problem List were reviewed in the rooming section of Epic.     ROS: Other than symptoms mentioned in the HPI, no fevers, chills, or other skin complaints    Physical Examination     GENERAL: Well-appearing female in no acute distress, resting  comfortably.  NEURO: Alert and oriented, answers questions appropriately  PSYCH: Normal mood and affect  RESP: No increased work of breathing  SKIN: Examination of the hair, scalp, face, hands, feet, and nails was performed  - Right vertex scalp with focal ovoid area of hairs at different lengths with apparent breakage. On trichoscopy, no perifollicular erythema, scale, or follicular dropout. Rest of scalp unremarkable.   - Fingernails without pitting or notable onycholysis  - R great toenail with onychodystrophy    All areas not commented on are within normal limits or unremarkable      (Approved Template 09/01/2020)

## 2024-10-22 DIAGNOSIS — L601 Onycholysis: Principal | ICD-10-CM

## 2024-10-22 DIAGNOSIS — L409 Psoriasis, unspecified: Principal | ICD-10-CM

## 2024-10-22 DIAGNOSIS — L659 Nonscarring hair loss, unspecified: Principal | ICD-10-CM

## 2024-10-22 MED ORDER — FLUOCINOLONE 0.01 % SCALP OIL AND SHOWER CAP
TOPICAL | 5 refills | 0.00000 days | Status: CP
Start: 2024-10-22 — End: ?

## 2024-10-22 NOTE — Patient Instructions (Addendum)
 It was nice to see you today! Your resident physician was Dr. Darrel     If any of your medications are too expensive, look for a coupon at GoodRx.com or reference the application on a smartphone  - Enter the medication name, size, and your zip code to find coupons for local pharmacies.   - Print a coupon and bring it to the pharmacy, or pull up the coupon on a smartphone.  - You can also call pharmacies to ask about the cost of your medication before you pick it up.  If you still cannot afford your medication, please let us  know.     Please call our clinic at [phone] with any concerns or to schedule a follow up appointment. We look forward to seeing you again!     [facility] releases most results to you as soon as they are available. Therefore, you may see some results before we do. Please give us  2 business days to review the tests and contact you by phone or through MyChart. If you are concerned that some results may be upsetting or confusing, you may wish to wait until we contact you before looking at the report in MyChart. If you have an urgent question, you can send us  a message or call our clinic. Otherwise, we prefer that you wait 2 business days for us  to contact you.     Meet your team:     Your intake nurse is: Bernice     Please remember to fill out the survey you will receive after your visit. Your comments help us  continue to improve our care.      Thanks in advance!      [facility] Clinical Staff

## 2024-10-22 NOTE — Addendum Note (Signed)
 Addended by: DEMETRIA RACE A on: 10/22/2024 11:39 AM     Modules accepted: Level of Service

## 2024-10-29 LAB — GENECONNECT MOLECULAR SCREEN: Genetic Analysis Overall Interpretation: NEGATIVE

## 2024-11-29 NOTE — Progress Notes (Signed)
 [facility] Home Delivery Pharmacy Refill Coordination Note    Specialty Medication(s) to be Shipped:   Inflammatory Disorders: Skyrizi     Other medication(s) to be shipped: No additional medications requested for fill at this time    Specialty Medications not needed at this time: N/A     Mary Waters, DOB: 10-May-1966  Phone: [phone] (home)       All above HIPAA information was verified with patient.     Was a nurse, learning disability used for this call? No    Completed refill call assessment today to schedule patient's medication shipment from the [facility] Pharm[facility]  [phone]).  All relevant notes have been reviewed.     Specialty medication(s) and dose(s) confirmed: Regimen is correct and unchanged.   Changes to medications: Mary Waters reports no changes at this time.  Changes to insurance: No  New side effects reported not previously addressed with a pharmacist or physician: None reported  Questions for the pharmacist: No    Confirmed patient received a Conservation Officer, Historic Buildings and a Surveyor, Mining with first shipment. The patient will receive a drug information handout for each medication shipped and additional FDA Medication Guides as required.       DISEASE/MEDICATION-SPECIFIC INFORMATION        For patients on injectable medications: Next injection is scheduled for 12.21.25.    SPECIALTY MEDICATION ADHERENCE     Medication Adherence    Patient reported X missed doses in the last month: 0  Specialty Medication: SKYRIZI  150 mg/mL Pnij (risankizumab -rzaa)  Patient is on additional specialty medications: No              Were doses missed due to medication being on hold? No        SKYRIZI  150 mg/mL Pnij (risankizumab -rzaa)   : 0 doses of medicine on hand       REFERRAL TO PHARMACIST     Referral to the pharmacist: Not needed      SHIPPING     Shipping address confirmed in Epic.     Cost and Payment: Patient has a $0 copay, payment information is not required.    Delivery Scheduled: Yes, Expected medication delivery date: 12.18.25.     Medication will be delivered via Next Day Courier to the prescription address in Epic WAM.    Doyal Hurst   [facility] Delivery Pharm[facility]  Specialty Technician

## 2024-12-05 MED FILL — SKYRIZI 150 MG/ML SUBCUTANEOUS PEN INJECTOR: SUBCUTANEOUS | 84 days supply | Qty: 1 | Fill #2
